# Patient Record
Sex: Female | Born: 1990
Health system: Southern US, Community
[De-identification: ages and names within clinical notes are randomized; demographics above are authoritative.]

## PROBLEM LIST (undated history)

## (undated) DIAGNOSIS — D649 Anemia, unspecified: Secondary | ICD-10-CM

## (undated) DIAGNOSIS — Z720 Tobacco use: Secondary | ICD-10-CM

## (undated) DIAGNOSIS — U071 COVID-19: Secondary | ICD-10-CM

## (undated) DIAGNOSIS — E559 Vitamin D deficiency, unspecified: Secondary | ICD-10-CM

## (undated) DIAGNOSIS — K5900 Constipation, unspecified: Secondary | ICD-10-CM

## (undated) DIAGNOSIS — J45909 Unspecified asthma, uncomplicated: Secondary | ICD-10-CM

## (undated) HISTORY — DX: Unspecified asthma, uncomplicated: J45.909

## (undated) HISTORY — DX: Vitamin D deficiency, unspecified: E55.9

## (undated) HISTORY — DX: COVID-19: U07.1

## (undated) HISTORY — DX: Constipation, unspecified: K59.00

## (undated) HISTORY — DX: Anemia, unspecified: D64.9

---

## 2011-05-28 ENCOUNTER — Encounter: Payer: Self-pay | Admitting: Emergency Medicine

## 2011-05-28 ENCOUNTER — Emergency Department (HOSPITAL_COMMUNITY)
Admission: EM | Admit: 2011-05-28 | Discharge: 2011-05-28 | Disposition: A | Discharge status: Home / Self Care | Payer: Self-pay | Attending: Emergency Medicine | Admitting: Emergency Medicine

## 2011-05-28 DIAGNOSIS — F17200 Nicotine dependence, unspecified, uncomplicated: Secondary | ICD-10-CM | POA: Insufficient documentation

## 2011-05-28 DIAGNOSIS — K029 Dental caries, unspecified: Secondary | ICD-10-CM | POA: Insufficient documentation

## 2011-05-28 DIAGNOSIS — K089 Disorder of teeth and supporting structures, unspecified: Secondary | ICD-10-CM | POA: Insufficient documentation

## 2011-05-28 MED ORDER — OXYCODONE-ACETAMINOPHEN 5-325 MG PO TABS
ORAL_TABLET | ORAL | Status: AC
  Filled 2011-05-28: qty 1

## 2011-05-28 MED ORDER — PENICILLIN V POTASSIUM 500 MG PO TABS: 500.0000 mg | ORAL_TABLET | Freq: Four times a day (QID) | ORAL | Status: AC

## 2011-05-28 MED ORDER — OXYCODONE-ACETAMINOPHEN 5-325 MG PO TABS
1.0000 | ORAL_TABLET | Freq: Once | ORAL | Status: AC
  Administered 2011-05-28: 1 via ORAL
  Filled 2011-05-28: qty 1

## 2011-05-28 NOTE — ED Provider Notes (Signed)
History     CSN: [id number]  Arrival date & time 05/28/11  1712   First MD Initiated Contact with Patient 05/28/11 1748      Chief Complaint  Patient presents with  . Dental Pain     Patient is a 20 y.o. female presenting with tooth pain. The history is provided by the patient.  Dental PainThe primary symptoms include mouth pain. Primary symptoms do not include fever. Episode onset: several months ago. The symptoms are worsening. The symptoms are chronic. The symptoms occur constantly.  Additional symptoms do not include: trouble swallowing.    Past Medical History  Diagnosis Date  . Asthma     History reviewed. No pertinent past surgical history.  No family history on file.  History  Substance Use Topics  . Smoking status: Current Everyday Smoker  . Smokeless tobacco: Not on file  . Alcohol Use: Yes     occasional    OB History    Grav Para Term Preterm Abortions TAB SAB Ect Mult Living                  Review of Systems  Constitutional: Negative for fever.  HENT: Negative for trouble swallowing.     Allergies  Review of patient's allergies indicates no known allergies.  Home Medications   Current Outpatient Rx  Name Route Sig Dispense Refill  . PENICILLIN V POTASSIUM 500 MG PO TABS Oral Take 1 tablet (500 mg total) by mouth 4 (four) times daily. 40 tablet 0    BP 127/73  Pulse 82  Temp 99.1 F (37.3 C)  Resp 18  Ht 5\' 1"  (1.549 m)  Wt 106 lb (48.081 kg)  BMI 20.03 kg/m2  SpO2 100%  LMP 04/02/2011  Physical Exam  CONSTITUTIONAL: Well developed/well nourished HEAD AND FACE: Normocephalic/atraumatic EYES: EOMI ENMT: Mucous membranes moist, dental decay noted to tooth #10, no abscess noted No trismus noted NECK: supple no meningeal signs CV: S1/S2 noted, no murmurs/rubs/gallops noted LUNGS: Lungs are clear to auscultation bilaterally, no apparent distress ABDOMEN: soft, nontender, no rebound or guarding NEURO: Pt is awake/alert, moves all  extremitiesx4 EXTREMITIES:  full ROM SKIN: warm, color normal PSYCH: no abnormalities of mood noted   ED Course  Procedures     1. Dental caries       MDM  Nursing notes reviewed and considered in documentation         Joya Gaskins, MD 05/28/11 [phone]

## 2011-05-28 NOTE — ED Notes (Signed)
Pt c/o left upper toothache 

## 2011-05-28 NOTE — ED Notes (Signed)
Dr. Bebe Shaggy in to assess patient prior to my seeing patient.

## 2011-06-13 ENCOUNTER — Emergency Department (HOSPITAL_COMMUNITY)
Admission: EM | Admit: 2011-06-13 | Discharge: 2011-06-13 | Disposition: A | Discharge status: Home / Self Care | Payer: Self-pay | Attending: Emergency Medicine | Admitting: Emergency Medicine

## 2011-06-13 ENCOUNTER — Encounter (HOSPITAL_COMMUNITY): Payer: Self-pay

## 2011-06-13 DIAGNOSIS — F17200 Nicotine dependence, unspecified, uncomplicated: Secondary | ICD-10-CM | POA: Insufficient documentation

## 2011-06-13 DIAGNOSIS — J45909 Unspecified asthma, uncomplicated: Secondary | ICD-10-CM | POA: Insufficient documentation

## 2011-06-13 DIAGNOSIS — J45901 Unspecified asthma with (acute) exacerbation: Secondary | ICD-10-CM

## 2011-06-13 MED ORDER — IPRATROPIUM BROMIDE 0.02 % IN SOLN
0.5000 mg | Freq: Once | RESPIRATORY_TRACT | Status: AC
  Administered 2011-06-13: 0.5 mg via RESPIRATORY_TRACT
  Filled 2011-06-13: qty 2.5

## 2011-06-13 MED ORDER — ALBUTEROL SULFATE HFA 108 (90 BASE) MCG/ACT IN AERS: 1.0000 | INHALATION_SPRAY | Freq: Four times a day (QID) | RESPIRATORY_TRACT | Status: DC | PRN

## 2011-06-13 MED ORDER — ALBUTEROL SULFATE (5 MG/ML) 0.5% IN NEBU
5.0000 mg | INHALATION_SOLUTION | RESPIRATORY_TRACT | Status: AC
  Administered 2011-06-13: 5 mg via RESPIRATORY_TRACT
  Filled 2011-06-13 (×2): qty 0.5

## 2011-06-13 MED ORDER — PREDNISONE 50 MG PO TABS: ORAL_TABLET | ORAL | Status: AC

## 2011-06-13 MED ORDER — PREDNISONE 20 MG PO TABS
60.0000 mg | ORAL_TABLET | Freq: Once | ORAL | Status: AC
  Administered 2011-06-13: 60 mg via ORAL
  Filled 2011-06-13: qty 3

## 2011-06-13 NOTE — ED Notes (Signed)
Pt reports ate breakfast and started coughing after eating.  Pt reports became SOB.  States has history of asthma  And usually uses albuterol inhaler and nebulizer prn but is out of her medication.  C/O chest tightness.  Denies any cough or cold symptoms prior to this episode.

## 2011-06-13 NOTE — ED Provider Notes (Signed)
This chart was scribed for Joya Gaskins, MD by Williemae Natter. The patient was seen in room APA01/APA01 at 9:50 AM.  CSN: [id number]  Arrival date & time 06/13/11  4098   First MD Initiated Contact with Patient 06/13/11 0901      Chief Complaint  Patient presents with  . Shortness of Breath    HPI Madeline Davis is a 21 y.o. female who presents to the Emergency Department complaining of an asthma attack. She has no fever but has some associated wheezing. She ran out of her medications and is an everyday smoker. She woke up coughing/wheezing and mild chest tightness She denies recent h/o ICU admission She is new to area, no local PCP Similar to prior episodes of asthma   Past Medical History  Diagnosis Date  . Asthma     History reviewed. No pertinent past surgical history.  No family history on file.  History  Substance Use Topics  . Smoking status: Current Everyday Smoker  . Smokeless tobacco: Not on file  . Alcohol Use: No    OB History    Grav Para Term Preterm Abortions TAB SAB Ect Mult Living                  Review of Systems 10 Systems reviewed and are negative for acute change except as noted in the HPI.  Allergies  Review of patient's allergies indicates no known allergies.  Home Medications  No current outpatient prescriptions on file.  BP 112/75  Pulse 92  Temp(Src) 98.4 F (36.9 C) (Oral)  Resp 24  Ht 5\' 2"  (1.575 m)  Wt 107 lb (48.535 kg)  BMI 19.57 kg/m2  SpO2 99%  LMP 06/04/2011  Physical Exam CONSTITUTIONAL: Well developed/well nourished HEAD AND FACE: Normocephalic/atraumatic EYES: EOMI/PERRL ENMT: Mucous membranes moist NECK: supple no meningeal signs SPINE:entire spine nontender CV: S1/S2 noted, no murmurs/rubs/gallops noted LUNGS: Lungs are clear to auscultation bilaterally, no apparent distress ABDOMEN: soft, nontender, no rebound or guarding GU:no cva tenderness NEURO: Pt is awake/alert, moves all  extremitiesx4 EXTREMITIES: pulses normal, full ROM, no edema SKIN: warm, color normal PSYCH: no abnormalities of mood noted  ED Course  Procedures   Pt had already received nebs prior to my eval, feels improved, but nurse did note wheezing on arrival Stable for d/c  The patient appears reasonably screened and/or stabilized for discharge and I doubt any other medical condition or other [place] requiring further screening, evaluation, or treatment in the ED at this time prior to discharge.    MDM  Nursing notes reviewed and considered in documentation   I personally performed the services described in this documentation, which was scribed in my presence. The recorded information has been reviewed and considered.          Joya Gaskins, MD 06/13/11 1026

## 2011-08-10 ENCOUNTER — Encounter (HOSPITAL_COMMUNITY): Payer: Self-pay | Admitting: *Deleted

## 2011-08-10 ENCOUNTER — Emergency Department (HOSPITAL_COMMUNITY): Payer: Self-pay

## 2011-08-10 ENCOUNTER — Emergency Department (HOSPITAL_COMMUNITY)
Admission: EM | Admit: 2011-08-10 | Discharge: 2011-08-10 | Disposition: A | Discharge status: Home / Self Care | Payer: Self-pay | Attending: Emergency Medicine | Admitting: Emergency Medicine

## 2011-08-10 DIAGNOSIS — N760 Acute vaginitis: Secondary | ICD-10-CM | POA: Insufficient documentation

## 2011-08-10 DIAGNOSIS — A499 Bacterial infection, unspecified: Secondary | ICD-10-CM | POA: Insufficient documentation

## 2011-08-10 DIAGNOSIS — R1032 Left lower quadrant pain: Secondary | ICD-10-CM | POA: Insufficient documentation

## 2011-08-10 DIAGNOSIS — R109 Unspecified abdominal pain: Secondary | ICD-10-CM | POA: Insufficient documentation

## 2011-08-10 DIAGNOSIS — J45909 Unspecified asthma, uncomplicated: Secondary | ICD-10-CM | POA: Insufficient documentation

## 2011-08-10 DIAGNOSIS — K5900 Constipation, unspecified: Secondary | ICD-10-CM | POA: Insufficient documentation

## 2011-08-10 DIAGNOSIS — F17200 Nicotine dependence, unspecified, uncomplicated: Secondary | ICD-10-CM | POA: Insufficient documentation

## 2011-08-10 DIAGNOSIS — B9689 Other specified bacterial agents as the cause of diseases classified elsewhere: Secondary | ICD-10-CM | POA: Insufficient documentation

## 2011-08-10 LAB — URINALYSIS, ROUTINE W REFLEX MICROSCOPIC
Bilirubin Urine: NEGATIVE
Glucose, UA: NEGATIVE mg/dL
Hgb urine dipstick: NEGATIVE
Ketones, ur: NEGATIVE mg/dL
Leukocytes, UA: NEGATIVE
Nitrite: NEGATIVE
Protein, ur: NEGATIVE mg/dL
Specific Gravity, Urine: <1.005 — ABNORMAL LOW (ref 1.005–1.030)
Urobilinogen, UA: 0.2 mg/dL (ref 0.0–1.0)
pH: 6 (ref 5.0–8.0)

## 2011-08-10 LAB — POCT PREGNANCY, URINE: Preg Test, Ur: NEGATIVE

## 2011-08-10 LAB — CBC
HCT: 36.8 % (ref 36.0–46.0)
Hemoglobin: 12.4 g/dL (ref 12.0–15.0)
MCH: 28.4 pg (ref 26.0–34.0)
MCHC: 33.7 g/dL (ref 30.0–36.0)
MCV: 84.2 fL (ref 78.0–100.0)
Platelets: 328 10*3/uL (ref 150–400)
RBC: 4.37 MIL/uL (ref 3.87–5.11)
RDW: 12.3 % (ref 11.5–15.5)
WBC: 9.4 10*3/uL (ref 4.0–10.5)

## 2011-08-10 LAB — POCT I-STAT, CHEM 8
BUN: 4 mg/dL — ABNORMAL LOW (ref 6–23)
Calcium, Ion: 1.16 mmol/L (ref 1.12–1.32)
Chloride: 108 mEq/L (ref 96–112)
Creatinine, Ser: 0.7 mg/dL (ref 0.50–1.10)
Glucose, Bld: 84 mg/dL (ref 70–99)
HCT: 38 % (ref 36.0–46.0)
Hemoglobin: 12.9 g/dL (ref 12.0–15.0)
Potassium: 3.7 mEq/L (ref 3.5–5.1)
Sodium: 143 mEq/L (ref 135–145)
TCO2: 25 mmol/L (ref 0–100)

## 2011-08-10 LAB — WET PREP, GENITAL
Trich, Wet Prep: NONE SEEN
Yeast Wet Prep HPF POC: NONE SEEN

## 2011-08-10 MED ORDER — KETOROLAC TROMETHAMINE 30 MG/ML IJ SOLN
30.0000 mg | Freq: Once | INTRAMUSCULAR | Status: AC
  Administered 2011-08-10: 30 mg via INTRAVENOUS
  Filled 2011-08-10: qty 1

## 2011-08-10 MED ORDER — METRONIDAZOLE 500 MG PO TABS: 500.0000 mg | ORAL_TABLET | Freq: Two times a day (BID) | ORAL | Status: AC

## 2011-08-10 MED ORDER — NAPROXEN 500 MG PO TABS: 500.0000 mg | ORAL_TABLET | Freq: Two times a day (BID) | ORAL | Status: DC

## 2011-08-10 MED ORDER — HYDROCODONE-ACETAMINOPHEN 5-500 MG PO TABS: 1.0000 | ORAL_TABLET | Freq: Four times a day (QID) | ORAL | Status: AC | PRN

## 2011-08-10 NOTE — Discharge Instructions (Signed)
You have been diagnosed with undifferentiated abdominal pain.  Abdominal pain can be caused by many things. Your caregiver evaluates the seriousness of your pain by an examination and possibly blood or urine tests and imaging (CT scan, x-rays, ultrasound). Many cases can be observed and treated at home after initial evaluation in the emergency department. Even though you are being discharged home, abdominal pain can be unpredictable. Therefore, you need a repeat exam if your pain does not resolve, returns, or worsens. Most patient's with abdominal pain do not need to be admitted to the hospital or have surgery, but serious problems like appendicitis and gallbladder attacks can start out as nonspecific pain. Many abdominal conditions cannot be diagnosed in 1 visit, so followup evaluations are very important.  Seek immediate medical attention if:  *The pain does not go away or becomes severe. *Temperature above 101 develops *Repeated vomiting occurs(multiple episodes) *The pain becomes localized to portions of the abdomen. The right side could possibly be appendicitis. In an adult, the left lower portion of the abdomen could be colitis or diverticulitis. *Blood is being passed in stools or vomit *Return also if you develop chest pain, difficulty breathing, dizziness or fainting, or become confused poorly responsive or inconsolable (young children).     If you do not have a physician, you should reference the below phone numbers and call in the morning to establish follow up care.  RESOURCE GUIDE  Dental Problems  Patients with Medicaid: [facility]                     [facility] [address]                                           [address] Phone:  [phone]                                                  Phone:  [phone]  If unable to pay or uninsured, contact:  [facility] or [facility] to become qualified for the adult dental  clinic.  Chronic Pain Problems Contact [facility]  [phone] Patients need to be referred by their primary care doctor.  Insufficient Money for Medicine Contact [employer]:  call "211" or [facility] 271-5999.  No Primary Care Doctor Call [facility]  832-8000 Other agencies that provide inexpensive medical care    [facility]  [phone]    [facility]  [phone]    [facility]  [phone]    [facility]  [phone]    [facility]  [phone]    [facility]  [phone]  [facility]ological Services [facility]  [phone] [facility]  [phone] [facility]   [phone] (emergency services [phone])  Substance Abuse Resources [facility]  336-882-2125 [facility] 336-784-9470 [facility] 336-285-9073 [facility] 336-845-3988 Residential & Outpatient Substance Abuse Program  800-659-3381  Abuse/Neglect [place] Child Abuse Hotline (336) 641-3795 [place] Child Abuse Hotline 800-378-5315 (After Hours)  Emergency Shelter [facility] (336) 271-5985  [facility] (336) 275-9566 [facility]   Services (704) 372-4663  MRSA Hotline #:   832-7006    [place] Resources  [facility] [place]     [employer]                          [facility] [address] [place]                       [address]      [address]                                                  [place]                            [place] Phone:  [phone]                                   Phone:  [phone]                 Phone:  [phone]  [facility] Phone:  [phone]  [place] Child Abuse Hotline (336) 342-1394 (336) 342-3537 (After Hours)    

## 2011-08-10 NOTE — ED Provider Notes (Signed)
History     CSN: [id number]  Arrival date & time 08/10/11  0444   First MD Initiated Contact with Patient 08/10/11 0458      Chief Complaint  Patient presents with  . Flank Pain  . Abdominal Pain    LLQ    (Consider location/radiation/quality/duration/timing/severity/associated sxs/prior treatment) HPI Comments: 21 year old female with a history of no significant past medical problems other than asthma. She presents with acute onset of left lower quadrantand pain which is pressure-like and sharp and stabbing and radiating to the left flank. This is persistent, nothing makes this better, worse with palpation of the abdomen, not associated with fevers chills nausea vomiting dysuria diarrhea or rectal bleeding. She does have constipation, she endorses vaginal discharge which is chronic and creamy. The symptoms are persistent overnight, she has not had these symptoms in the past.  No past medical history of abdominal surgery  Patient is a 21 y.o. female presenting with flank pain and abdominal pain. The history is provided by the patient and a friend.  Flank Pain Associated symptoms include abdominal pain.  Abdominal Pain The primary symptoms of the illness include abdominal pain.    Past Medical History  Diagnosis Date  . Asthma     History reviewed. No pertinent past surgical history.  No family history on file.  History  Substance Use Topics  . Smoking status: Current Everyday Smoker -- 0.5 packs/day    Types: Cigarettes  . Smokeless tobacco: Not on file  . Alcohol Use: No    OB History    Grav Para Term Preterm Abortions TAB SAB Ect Mult Living                  Review of Systems  Gastrointestinal: Positive for abdominal pain.  Genitourinary: Positive for flank pain.  All other systems reviewed and are negative.    Allergies  Review of patient's allergies indicates no known allergies.  Home Medications   Current Outpatient Rx  Name Route Sig Dispense  Refill  . ALBUTEROL SULFATE HFA 108 (90 BASE) MCG/ACT IN AERS Inhalation Inhale 1-2 puffs into the lungs every 6 (six) hours as needed for wheezing. 1 Inhaler 1  . HYDROCODONE-ACETAMINOPHEN 5-500 MG PO TABS Oral Take 1-2 tablets by mouth every 6 (six) hours as needed for pain. 15 tablet 0  . METRONIDAZOLE 500 MG PO TABS Oral Take 1 tablet (500 mg total) by mouth 2 (two) times daily. 14 tablet 0  . NAPROXEN 500 MG PO TABS Oral Take 1 tablet (500 mg total) by mouth 2 (two) times daily with a meal. 30 tablet 0    BP 126/70  Pulse 71  Temp(Src) 98.7 F (37.1 C) (Oral)  Resp 20  Ht 5\' 1"  (1.549 m)  Wt 110 lb (49.896 kg)  BMI 20.78 kg/m2  SpO2 98%  LMP 08/01/2011  Physical Exam  Nursing note and vitals reviewed. Constitutional: She appears well-developed and well-nourished. No distress.  HENT:  Head: Normocephalic and atraumatic.  Mouth/Throat: Oropharynx is clear and moist. No oropharyngeal exudate.  Eyes: Conjunctivae and EOM are normal. Pupils are equal, round, and reactive to light. Right eye exhibits no discharge. Left eye exhibits no discharge. No scleral icterus.  Neck: Normal range of motion. Neck supple. No JVD present. No thyromegaly present.  Cardiovascular: Normal rate, regular rhythm, normal heart sounds and intact distal pulses.  Exam reveals no gallop and no friction rub.   No murmur heard. Pulmonary/Chest: Effort normal and breath sounds normal. No respiratory  distress. She has no wheezes. She has no rales.  Abdominal: Soft. Bowel sounds are normal. She exhibits no distension and no mass. There is tenderness ( guarding on the left lower cautery and, tenderness in the left lower quadrant, tenderness over the left flank.). There is guarding. There is no rebound.  Genitourinary:       Normal appearing vagina, minimal vaginal discharge, cervical os is closed, no cervical motion tenderness or adnexal tenderness or masses.  Chaperone present for entire vaginal exam   Musculoskeletal: Normal range of motion. She exhibits no edema and no tenderness.  Lymphadenopathy:    She has no cervical adenopathy.  Neurological: She is alert. Coordination normal.  Skin: Skin is warm and dry. No rash noted. No erythema.  Psychiatric: She has a normal mood and affect. Her behavior is normal.    ED Course  Procedures (including critical care time)  Labs Reviewed  URINALYSIS, ROUTINE W REFLEX MICROSCOPIC - Abnormal; Notable for the following:    Color, Urine STRAW (*)    Specific Gravity, Urine <1.005 (*)    All other components within normal limits  WET PREP, GENITAL - Abnormal; Notable for the following:    Clue Cells Wet Prep HPF POC MANY (*)    WBC, Wet Prep HPF POC FEW (*)    All other components within normal limits  POCT I-STAT, CHEM 8 - Abnormal; Notable for the following:    BUN 4 (*)    All other components within normal limits  CBC  POCT PREGNANCY, URINE  GC/CHLAMYDIA PROBE AMP, GENITAL   Ct Abdomen Pelvis Wo Contrast  08/10/2011  *RADIOLOGY REPORT*  Clinical Data: Left lower quadrant pain  CT ABDOMEN AND PELVIS WITHOUT CONTRAST  Technique:  Multidetector CT imaging of the abdomen and pelvis was performed following the standard protocol without intravenous contrast.  Comparison: None.  Findings: Limited images through the lung bases demonstrate no significant appreciable abnormality. The heart size is within normal limits. No pleural or pericardial effusion.  Intra-abdominal organ evaluation is limited without intravenous contrast.  Within this limitation, unremarkable liver, biliary system, spleen, pancreas, adrenal glands.  Symmetric renal size.  No hydronephrosis or hydroureter.  No calcifications along the expected ureteral course.  No bowel obstruction.  No CT evidence for colitis.  Limited evaluation of the appendix without intravenous or enteric contrast, does appear to fill with air.  Thin-walled bladder.  Indistinct appearance to the adnexa;  difficult to separate ovaries from adjacent to decompressed bowel loops.  Small amount of free fluid dependently within the pelvis.  Normal caliber vasculature.  No acute osseous abnormality.  IMPRESSION: No hydronephrosis or urinary tract calculi identified.  Small amount of free fluid within the pelvis is often physiologic. The adnexa are incompletely evaluated on this examination and if there is concern for pelvic pathology, consider ultrasound.  Original Report Authenticated By: Waneta Martins, M.D.     1. Abdominal pain   2. Bacterial vaginosis       MDM  Vital signs are unremarkable, patient has focal left lower quadrant symptoms without urinary symptoms. Sandi C. Chlamydia swab, urinalysis, rule out UTI versus diverticulitis versus ureterolithiasis  Patient reexamined, sleeping, abdominal pain improved on exam with minimal tenderness in the left lower quadrant. Again the patient had no adnexal tenderness or fullness, unlikely to be ovarian torsion, CT scan results reveal no signs of kidney stones or any other significant abnormalities and blood work shows normal white blood cell count, electrolytes, urinalysis. The wet prep  does confirm bacterial vaginosis for which the patient will be treated.  I've explained the results to the patient and have encouraged her to return within 12 hours should her symptoms worsen or continue.  Discharge Prescriptions include:  #1 Naprosyn #2 hydrocodone #3 Flagyl      Vida Roller, MD 08/10/11 [phone]

## 2011-08-10 NOTE — ED Notes (Signed)
C/o LLQ pressure sharp, stabbing pain to left flank onset 2200 yesterday.

## 2011-08-11 LAB — GC/CHLAMYDIA PROBE AMP, GENITAL
Chlamydia, DNA Probe: NEGATIVE
GC Probe Amp, Genital: NEGATIVE

## 2011-12-06 ENCOUNTER — Encounter (HOSPITAL_COMMUNITY): Payer: Self-pay | Admitting: Emergency Medicine

## 2011-12-06 ENCOUNTER — Emergency Department (HOSPITAL_COMMUNITY): Payer: No Typology Code available for payment source

## 2011-12-06 ENCOUNTER — Emergency Department (HOSPITAL_COMMUNITY)
Admission: EM | Admit: 2011-12-06 | Discharge: 2011-12-06 | Discharge status: Against Medical Advice | Payer: No Typology Code available for payment source | Attending: Emergency Medicine | Admitting: Emergency Medicine

## 2011-12-06 DIAGNOSIS — T07XXXA Unspecified multiple injuries, initial encounter: Secondary | ICD-10-CM | POA: Insufficient documentation
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
## 2012-03-06 ENCOUNTER — Emergency Department (HOSPITAL_COMMUNITY)
Admission: EM | Admit: 2012-03-06 | Discharge: 2012-03-06 | Disposition: A | Discharge status: Home / Self Care | Payer: No Typology Code available for payment source | Attending: Emergency Medicine | Admitting: Emergency Medicine

## 2012-03-06 ENCOUNTER — Encounter (HOSPITAL_COMMUNITY): Payer: Self-pay | Admitting: *Deleted

## 2012-03-06 DIAGNOSIS — S239XXA Sprain of unspecified parts of thorax, initial encounter: Secondary | ICD-10-CM | POA: Insufficient documentation

## 2012-03-06 DIAGNOSIS — F17200 Nicotine dependence, unspecified, uncomplicated: Secondary | ICD-10-CM | POA: Insufficient documentation

## 2012-03-06 DIAGNOSIS — T148XXA Other injury of unspecified body region, initial encounter: Secondary | ICD-10-CM

## 2012-03-06 DIAGNOSIS — S335XXA Sprain of ligaments of lumbar spine, initial encounter: Secondary | ICD-10-CM | POA: Insufficient documentation

## 2012-03-06 DIAGNOSIS — Y92410 Unspecified street and highway as the place of occurrence of the external cause: Secondary | ICD-10-CM | POA: Insufficient documentation

## 2012-03-06 DIAGNOSIS — J45909 Unspecified asthma, uncomplicated: Secondary | ICD-10-CM | POA: Insufficient documentation

## 2012-03-06 LAB — POCT PREGNANCY, URINE: Preg Test, Ur: NEGATIVE

## 2012-03-06 MED ORDER — IBUPROFEN 600 MG PO TABS: 600.0000 mg | ORAL_TABLET | Freq: Four times a day (QID) | ORAL | Status: DC | PRN

## 2012-03-06 MED ORDER — HYDROCODONE-ACETAMINOPHEN 5-325 MG PO TABS: 1.0000 | ORAL_TABLET | ORAL | Status: AC | PRN

## 2012-03-06 MED ORDER — CYCLOBENZAPRINE HCL 5 MG PO TABS: 5.0000 mg | ORAL_TABLET | Freq: Three times a day (TID) | ORAL | Status: DC | PRN
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
## 2012-03-08 NOTE — ED Provider Notes (Signed)
Medical screening examination/treatment/procedure(s) were performed by non-physician practitioner and as supervising physician I was immediately available for consultation/collaboration.   Shelda Jakes, MD 03/08/12 [phone]

## 2013-03-15 ENCOUNTER — Other Ambulatory Visit (HOSPITAL_COMMUNITY): Payer: Self-pay | Admitting: Nurse Practitioner

## 2013-03-15 DIAGNOSIS — N63 Unspecified lump in unspecified breast: Secondary | ICD-10-CM

## 2013-03-21 ENCOUNTER — Ambulatory Visit (HOSPITAL_COMMUNITY): Payer: Self-pay | Attending: Nurse Practitioner

## 2013-03-26 ENCOUNTER — Emergency Department (HOSPITAL_COMMUNITY)
Admission: EM | Admit: 2013-03-26 | Discharge: 2013-03-26 | Disposition: A | Discharge status: Home / Self Care | Payer: Self-pay | Attending: Emergency Medicine | Admitting: Emergency Medicine

## 2013-03-26 ENCOUNTER — Encounter (HOSPITAL_COMMUNITY): Payer: Self-pay | Admitting: Emergency Medicine

## 2013-03-26 DIAGNOSIS — Z79899 Other long term (current) drug therapy: Secondary | ICD-10-CM | POA: Insufficient documentation

## 2013-03-26 DIAGNOSIS — L723 Sebaceous cyst: Secondary | ICD-10-CM | POA: Insufficient documentation

## 2013-03-26 DIAGNOSIS — F17200 Nicotine dependence, unspecified, uncomplicated: Secondary | ICD-10-CM | POA: Insufficient documentation

## 2013-03-26 DIAGNOSIS — J45909 Unspecified asthma, uncomplicated: Secondary | ICD-10-CM | POA: Insufficient documentation

## 2013-03-26 MED ORDER — SULFAMETHOXAZOLE-TRIMETHOPRIM 800-160 MG PO TABS: 1.0000 | ORAL_TABLET | Freq: Two times a day (BID) | ORAL | Status: DC

## 2013-03-26 NOTE — ED Notes (Signed)
?   Abscess to groin area for 2 mos.

## 2013-03-26 NOTE — ED Notes (Signed)
Pt presents with abscess-like area to pelvic area. Pt states she first noticed area 2-3 months ago and had been seen previously for symptoms. Pt states symptoms have worsened and reports pain and drainage from area.

## 2013-03-29 NOTE — ED Provider Notes (Signed)
CSN: [id number]     Arrival date & time 03/26/13  1252 History   First MD Initiated Contact with Patient 03/26/13 1330     Chief Complaint  Patient presents with  . Abscess   (Consider location/radiation/quality/duration/timing/severity/associated sxs/prior Treatment) HPI Comments: Madeline Davis is a 22 y.o. Female presenting with a chronic abscess of her right groin.  She describes a small pore along her right groin which drains a small amount of purulent drianage when squeezed.  This started as a small round papule about 2 months ago and become a long, thin hard line of tender skin that is painful with palpation and when her panty edge rubs against it.  She denies fevers or chills, nausea or other complaint.  She has found no alleviators and was been using warm compresses without relief.     The history is provided by the patient.    Past Medical History  Diagnosis Date  . Asthma    History reviewed. No pertinent past surgical history. History reviewed. No pertinent family history. History  Substance Use Topics  . Smoking status: Current Every Day Smoker -- 0.50 packs/day for 6 years    Types: Cigarettes  . Smokeless tobacco: Never Used  . Alcohol Use: Yes     Comment: occasionally   OB History   Grav Para Term Preterm Abortions TAB SAB Ect Mult Living   1    1  1    0     Review of Systems  Constitutional: Negative for fever and chills.  HENT: Negative for facial swelling.   Respiratory: Negative for shortness of breath and wheezing.   Skin:       negative except as mentioned in hpi.  Neurological: Negative for numbness.    Allergies  Review of patient's allergies indicates no known allergies.  Home Medications   Current Outpatient Rx  Name  Route  Sig  Dispense  Refill  . albuterol (PROVENTIL HFA;VENTOLIN HFA) 108 (90 BASE) MCG/ACT inhaler   Inhalation   Inhale 2 puffs into the lungs every 6 (six) hours as needed for wheezing.         .  sulfamethoxazole-trimethoprim (SEPTRA DS) 800-160 MG per tablet   Oral   Take 1 tablet by mouth 2 (two) times daily.   28 tablet   0    BP 104/79  Pulse 87  Temp(Src) 98.9 F (37.2 C) (Oral)  Resp 20  Ht 5' (1.524 m)  Wt 99 lb (44.906 kg)  BMI 19.33 kg/m2  SpO2 99%  LMP 02/24/2013 Physical Exam  Constitutional: She appears well-developed and well-nourished. No distress.  HENT:  Head: Normocephalic.  Neck: Neck supple.  Cardiovascular: Normal rate.   Pulmonary/Chest: Effort normal. She has no wheezes.  Musculoskeletal: Normal range of motion. She exhibits no edema.  Skin: Skin is warm. No erythema.  Linear, slightly raised induration without fluctuance or erthema,  Right inguinal crease and groin measuring 0.5 cm x 6 cm.  Open pore at upper end which does drain a small amount of clear to slightly opaque fluid with ballotment.  No surrounding erythema.  No inguinal adenopathy.    ED Course  Procedures (including critical care time) Labs Review Labs Reviewed - No data to display Imaging Review No results found.  EKG Interpretation   None       MDM   1. Sebaceous cyst    Suspect chronic sebaceous cyst with chronic infection.  Placed on bactrim,  Encouraged to continue with warm soaks.  Referral to general surgery for definitive excision.    Burgess Amor, PA-C 03/29/13 1344

## 2013-03-30 NOTE — ED Provider Notes (Signed)
Medical screening examination/treatment/procedure(s) were performed by non-physician practitioner and as supervising physician I was immediately available for consultation/collaboration.  EKG Interpretation   None         Willard Farquharson L Annalisa Colonna, MD 03/30/13 1347 

## 2013-04-11 ENCOUNTER — Other Ambulatory Visit (HOSPITAL_COMMUNITY): Payer: Self-pay | Admitting: Nurse Practitioner

## 2013-04-11 ENCOUNTER — Ambulatory Visit (HOSPITAL_COMMUNITY)
Admission: RE | Admit: 2013-04-11 | Discharge: 2013-04-11 | Disposition: A | Discharge status: Home / Self Care | Payer: Self-pay | Source: Ambulatory Visit | Attending: Nurse Practitioner | Admitting: Nurse Practitioner

## 2013-04-11 DIAGNOSIS — N63 Unspecified lump in unspecified breast: Secondary | ICD-10-CM | POA: Insufficient documentation

## 2013-05-07 ENCOUNTER — Emergency Department (HOSPITAL_COMMUNITY): Payer: Self-pay

## 2013-05-07 ENCOUNTER — Emergency Department (HOSPITAL_COMMUNITY)
Admission: EM | Admit: 2013-05-07 | Discharge: 2013-05-07 | Disposition: A | Discharge status: Home / Self Care | Payer: Self-pay | Attending: Emergency Medicine | Admitting: Emergency Medicine

## 2013-05-07 ENCOUNTER — Encounter (HOSPITAL_COMMUNITY): Payer: Self-pay | Admitting: Emergency Medicine

## 2013-05-07 DIAGNOSIS — F17200 Nicotine dependence, unspecified, uncomplicated: Secondary | ICD-10-CM | POA: Insufficient documentation

## 2013-05-07 DIAGNOSIS — S82899A Other fracture of unspecified lower leg, initial encounter for closed fracture: Secondary | ICD-10-CM | POA: Insufficient documentation

## 2013-05-07 DIAGNOSIS — Z79899 Other long term (current) drug therapy: Secondary | ICD-10-CM | POA: Insufficient documentation

## 2013-05-07 DIAGNOSIS — Y939 Activity, unspecified: Secondary | ICD-10-CM | POA: Insufficient documentation

## 2013-05-07 DIAGNOSIS — J45901 Unspecified asthma with (acute) exacerbation: Secondary | ICD-10-CM | POA: Insufficient documentation

## 2013-05-07 DIAGNOSIS — Y929 Unspecified place or not applicable: Secondary | ICD-10-CM | POA: Insufficient documentation

## 2013-05-07 DIAGNOSIS — S82891A Other fracture of right lower leg, initial encounter for closed fracture: Secondary | ICD-10-CM

## 2013-05-07 DIAGNOSIS — R296 Repeated falls: Secondary | ICD-10-CM | POA: Insufficient documentation

## 2013-05-07 MED ORDER — IBUPROFEN 800 MG PO TABS: 800.0000 mg | ORAL_TABLET | Freq: Three times a day (TID) | ORAL | Status: DC

## 2013-05-07 MED ORDER — OXYCODONE-ACETAMINOPHEN 5-325 MG PO TABS: 1.0000 | ORAL_TABLET | Freq: Four times a day (QID) | ORAL | Status: DC | PRN

## 2013-05-07 NOTE — ED Notes (Signed)
Pain rt ankle,, swelling lat malleolus. No known injury.   Good dp pulse.  Ice pack applied.

## 2013-05-07 NOTE — ED Notes (Signed)
Patient c/o right ankle pain that started on Saturday. Per patient no known injury. Patient reports falling this morning because she was unable to put pressure on ankle. Patient reports swelling. Per patient took tylenol yesterday with no relief.

## 2013-05-07 NOTE — ED Provider Notes (Signed)
CSN: [id number]     Arrival date & time 05/07/13  1241 History   First MD Initiated Contact with Patient 05/07/13 1457     Chief Complaint  Patient presents with  . Ankle Pain   (Consider location/radiation/quality/duration/timing/severity/associated sxs/prior Treatment) Patient is a 22 y.o. female presenting with ankle pain. The history is provided by the patient.  Ankle Pain Location:  Ankle Time since incident:  2 days Ankle location:  R ankle Pain details:    Quality:  Sharp and throbbing   Severity:  Moderate   Onset quality:  Gradual   Duration:  2 days   Timing:  Constant   Progression:  Worsening Chronicity:  New Dislocation: no   Relieved by:  Nothing Ineffective treatments:  Acetaminophen Associated symptoms: no back pain and no neck pain   Risk factors: no known bone disorder, no obesity and no recent illness     Past Medical History  Diagnosis Date  . Asthma    History reviewed. No pertinent past surgical history. History reviewed. No pertinent family history. History  Substance Use Topics  . Smoking status: Current Every Day Smoker -- 0.50 packs/day for 6 years    Types: Cigarettes  . Smokeless tobacco: Never Used  . Alcohol Use: Yes     Comment: occasionally   OB History   Grav Para Term Preterm Abortions TAB SAB Ect Mult Living   1    1  1    0     Review of Systems  Constitutional: Negative for activity change.       All ROS Neg except as noted in HPI  HENT: Negative for nosebleeds.   Eyes: Negative for photophobia and discharge.  Respiratory: Positive for wheezing. Negative for cough and shortness of breath.   Cardiovascular: Negative for chest pain and palpitations.  Gastrointestinal: Negative for abdominal pain and blood in stool.  Genitourinary: Negative for dysuria, frequency and hematuria.  Musculoskeletal: Negative for arthralgias, back pain and neck pain.  Skin: Negative.   Neurological: Negative for dizziness, seizures and speech  difficulty.  Psychiatric/Behavioral: Negative for hallucinations and confusion.    Allergies  Review of patient's allergies indicates not on file.  Home Medications   Current Outpatient Rx  Name  Route  Sig  Dispense  Refill  . acetaminophen (TYLENOL) 500 MG tablet   Oral   Take 1,000 mg by mouth every 6 (six) hours as needed.         Marland Kitchen albuterol (PROVENTIL HFA;VENTOLIN HFA) 108 (90 BASE) MCG/ACT inhaler   Inhalation   Inhale 2 puffs into the lungs every 6 (six) hours as needed for wheezing.          BP 104/61  Pulse 93  Temp(Src) 97.2 F (36.2 C) (Oral)  Resp 18  Ht 5' (1.524 m)  Wt 98 lb (44.453 kg)  BMI 19.14 kg/m2  SpO2 100% Physical Exam  Nursing note and vitals reviewed. Constitutional: She is oriented to person, place, and time. She appears well-developed and well-nourished.  Non-toxic appearance.  HENT:  Head: Normocephalic.  Right Ear: Tympanic membrane and external ear normal.  Left Ear: Tympanic membrane and external ear normal.  Eyes: EOM and lids are normal. Pupils are equal, round, and reactive to light.  Neck: Normal range of motion. Neck supple. Carotid bruit is not present.  Cardiovascular: Normal rate, regular rhythm, normal heart sounds, intact distal pulses and normal pulses.   Pulmonary/Chest: Breath sounds normal. No respiratory distress.  Abdominal: Soft. Bowel sounds are  normal. There is no tenderness. There is no guarding.  Musculoskeletal: Normal range of motion.  Rt lateral malleolus swelling and pain to palpation. Mild increase redness present.Marland Kitchen FROM of the toes. Achilles intact.  Lymphadenopathy:       Head (right side): No submandibular adenopathy present.       Head (left side): No submandibular adenopathy present.    She has no cervical adenopathy.  Neurological: She is alert and oriented to person, place, and time. She has normal strength. No cranial nerve deficit or sensory deficit.  Skin: Skin is warm and dry.  Psychiatric: She  has a normal mood and affect. Her speech is normal.    ED Course  Procedures (including critical care time) Labs Review Labs Reviewed - No data to display Imaging Review Dg Ankle Complete Right  05/07/2013   CLINICAL DATA:  Right ankle pain without injury.  EXAM: RIGHT ANKLE - COMPLETE 3+ VIEW  COMPARISON:  None.  FINDINGS: Small crescent shaped bone fragment is seen distal to the distal fibula concerning for avulsion fracture. Soft tissue swelling is seen over the lateral malleolus. Joint spaces appear to be intact. Talar dome appears normal.  IMPRESSION: Probable avulsion fracture involving distal fibula of indeterminate age.   Electronically Signed   By: Roque Lias M.D.   On: 05/07/2013 13:26    EKG Interpretation   None       MDM  No diagnosis found. **I have reviewed nursing notes, vital signs, and all appropriate lab and imaging results for this patient.*  Xray of the right ankle reveals probable avulsion fracture of the disstal fibula. Pt fitted with ASO and given crutches. Rx for ibuprofen and oxycodone given to the patient.  Kathie Dike, PA-C 05/07/13 2146

## 2013-05-16 NOTE — ED Provider Notes (Signed)
Medical screening examination/treatment/procedure(s) were performed by non-physician practitioner and as supervising physician I was immediately available for consultation/collaboration.  EKG Interpretation   None         Shelda Jakes, MD 05/16/13 [phone]

## 2014-01-07 ENCOUNTER — Encounter: Payer: Self-pay | Admitting: Obstetrics & Gynecology

## 2014-04-01 ENCOUNTER — Encounter (HOSPITAL_COMMUNITY): Payer: Self-pay | Admitting: Emergency Medicine

## 2014-08-12 ENCOUNTER — Ambulatory Visit (INDEPENDENT_AMBULATORY_CARE_PROVIDER_SITE_OTHER): Payer: 59 | Admitting: Adult Health

## 2014-08-12 ENCOUNTER — Other Ambulatory Visit (HOSPITAL_COMMUNITY)
Admission: RE | Admit: 2014-08-12 | Discharge: 2014-08-12 | Disposition: A | Discharge status: Home / Self Care | Payer: 59 | Source: Ambulatory Visit | Attending: Adult Health | Admitting: Adult Health

## 2014-08-12 ENCOUNTER — Encounter: Payer: Self-pay | Admitting: Adult Health

## 2014-08-12 VITALS — BP 108/64 | HR 74 | Ht 60.5 in | Wt 107.0 lb

## 2014-08-12 DIAGNOSIS — Z113 Encounter for screening for infections with a predominantly sexual mode of transmission: Secondary | ICD-10-CM | POA: Diagnosis present

## 2014-08-12 DIAGNOSIS — Z3202 Encounter for pregnancy test, result negative: Secondary | ICD-10-CM | POA: Diagnosis not present

## 2014-08-12 DIAGNOSIS — K5900 Constipation, unspecified: Secondary | ICD-10-CM

## 2014-08-12 DIAGNOSIS — Z01419 Encounter for gynecological examination (general) (routine) without abnormal findings: Secondary | ICD-10-CM | POA: Insufficient documentation

## 2014-08-12 HISTORY — DX: Constipation, unspecified: K59.00

## 2014-08-12 LAB — POCT URINE PREGNANCY: Preg Test, Ur: NEGATIVE

## 2014-08-12 NOTE — Patient Instructions (Signed)
Physical in 1 year Will call with labs Try MIRALAX   Increase water and fruits  Constipation Constipation is when a person has fewer than three bowel movements a week, has difficulty having a bowel movement, or has stools that are dry, hard, or larger than normal. As people grow older, constipation is more common. If you try to fix constipation with medicines that make you have a bowel movement (laxatives), the problem may get worse. Long-term laxative use may cause the muscles of the colon to become weak. A low-fiber diet, not taking in enough fluids, and taking certain medicines may make constipation worse.  CAUSES   Certain medicines, such as antidepressants, pain medicine, iron supplements, antacids, and water pills.   Certain diseases, such as diabetes, irritable bowel syndrome (IBS), thyroid disease, or depression.   Not drinking enough water.   Not eating enough fiber-rich foods.   Stress or travel.   Lack of physical activity or exercise.   Ignoring the urge to have a bowel movement.   Using laxatives too much.  SIGNS AND SYMPTOMS   Having fewer than three bowel movements a week.   Straining to have a bowel movement.   Having stools that are hard, dry, or larger than normal.   Feeling full or bloated.   Pain in the lower abdomen.   Not feeling relief after having a bowel movement.  DIAGNOSIS  Your health care provider will take a medical history and perform a physical exam. Further testing may be done for severe constipation. Some tests may include:  A barium enema X-ray to examine your rectum, colon, and, sometimes, your small intestine.   A sigmoidoscopy to examine your lower colon.   A colonoscopy to examine your entire colon. TREATMENT  Treatment will depend on the severity of your constipation and what is causing it. Some dietary treatments include drinking more fluids and eating more fiber-rich foods. Lifestyle treatments may include regular  exercise. If these diet and lifestyle recommendations do not help, your health care provider may recommend taking over-the-counter laxative medicines to help you have bowel movements. Prescription medicines may be prescribed if over-the-counter medicines do not work.  HOME CARE INSTRUCTIONS   Eat foods that have a lot of fiber, such as fruits, vegetables, whole grains, and beans.  Limit foods high in fat and processed sugars, such as french fries, hamburgers, cookies, candies, and soda.   A fiber supplement may be added to your diet if you cannot get enough fiber from foods.   Drink enough fluids to keep your urine clear or pale yellow.   Exercise regularly or as directed by your health care provider.   Go to the restroom when you have the urge to go. Do not hold it.   Only take over-the-counter or prescription medicines as directed by your health care provider. Do not take other medicines for constipation without talking to your health care provider first.  SEEK IMMEDIATE MEDICAL CARE IF:   You have bright red blood in your stool.   Your constipation lasts for more than 4 days or gets worse.   You have abdominal or rectal pain.   You have thin, pencil-like stools.   You have unexplained weight loss. MAKE SURE YOU:   Understand these instructions.  Will watch your condition.  Will get help right away if you are not doing well or get worse. Document Released: 02/13/2004 Document Revised: 05/22/2013 Document Reviewed: 02/26/2013 [facility] Patient Information 2015 [employer] [place]. This information is not intended  to replace advice given to you by your health care provider. Make sure you discuss any questions you have with your health care provider.  

## 2014-08-12 NOTE — Progress Notes (Signed)
Patient ID: Madeline Davis, female   DOB: 01-21-1991, 24 y.o.   MRN: [id number] History of Present Illness: Madeline Davis is a 24 year old black female in for well woman gyn exam and pap and requests labs and STD screening.Her periods irregular since stopping depo.She is in female relationship at present.She complains of some constipation and breast tenderness.   Current Medications, Allergies, Past Medical History, Past Surgical History, Family History and Social History [facility].     Review of Systems: Patient denies any headaches, hearing loss, fatigue, blurred vision, shortness of breath, chest pain, abdominal pain, problems with  urination, or intercourse. No joint pain or mood swings.See HPI for positives.   Physical Exam:BP 108/64 mmHg  Pulse 74  Ht 5' 0.5" (1.537 m)  Wt 107 lb (48.535 kg)  BMI 20.55 kg/m2  LMP 08/08/2014 General:  Well developed, well nourished, no acute distress Skin:  Warm and dry Neck:  Midline trachea, normal thyroid, good ROM, no lymphadenopathy Lungs; Clear to auscultation bilaterally Breast:  No dominant palpable mass, retraction, or nipple discharge, tender UOQ bilaterally Cardiovascular: Regular rate and rhythm Abdomen:  Soft, non tender, no hepatosplenomegaly Pelvic:  External genitalia is normal in appearance, no lesions.  The vagina is normal in appearance, good color, moisture and rugae. Urethra has no lesions or masses. The cervix is tiny, pap with GC/CHL performed.  Uterus is felt to be normal size, shape, and contour.  No adnexal masses or tenderness noted.Bladder is non tender, no masses felt. Extremities/musculoskeletal:  No swelling or varicosities noted, no clubbing or cyanosis Psych:  No mood changes, alert and cooperative,seems happy   Impression: Well woman gyn exam with pap  Constipation STD screening    Plan: Check CBC,CMP,TSH and lipids and HSV2 and HIV and RPR and Vitamin D GC/CHL on pap Try  miralax and prune juice and increase water,review handout on constipation Decrease caffeine and do not wear under wire daily

## 2014-08-13 ENCOUNTER — Telehealth: Payer: Self-pay | Admitting: *Deleted

## 2014-08-13 ENCOUNTER — Telehealth: Payer: Self-pay | Admitting: Adult Health

## 2014-08-13 LAB — COMPREHENSIVE METABOLIC PANEL
ALT: 15 IU/L (ref 0–32)
AST: 19 IU/L (ref 0–40)
Albumin/Globulin Ratio: 1.5 (ref 1.1–2.5)
Albumin: 4.5 g/dL (ref 3.5–5.5)
Alkaline Phosphatase: 84 IU/L (ref 39–117)
BUN/Creatinine Ratio: 13 (ref 8–20)
BUN: 10 mg/dL (ref 6–20)
Bilirubin Total: 0.3 mg/dL (ref 0.0–1.2)
CO2: 23 mmol/L (ref 18–29)
Calcium: 9.3 mg/dL (ref 8.7–10.2)
Chloride: 101 mmol/L (ref 97–108)
Creatinine, Ser: 0.79 mg/dL (ref 0.57–1.00)
GFR calc Af Amer: 121 mL/min/{1.73_m2} (ref >59)
GFR calc non Af Amer: 105 mL/min/{1.73_m2} (ref >59)
Globulin, Total: 3 g/dL (ref 1.5–4.5)
Glucose: 84 mg/dL (ref 65–99)
Potassium: 4.1 mmol/L (ref 3.5–5.2)
Sodium: 139 mmol/L (ref 134–144)
Total Protein: 7.5 g/dL (ref 6.0–8.5)

## 2014-08-13 LAB — CBC
HCT: 40.9 % (ref 34.0–46.6)
Hemoglobin: 13.6 g/dL (ref 11.1–15.9)
MCH: 28.7 pg (ref 26.6–33.0)
MCHC: 33.3 g/dL (ref 31.5–35.7)
MCV: 86 fL (ref 79–97)
Platelets: 337 10*3/uL (ref 150–379)
RBC: 4.74 x10E6/uL (ref 3.77–5.28)
RDW: 13.5 % (ref 12.3–15.4)
WBC: 7.1 10*3/uL (ref 3.4–10.8)

## 2014-08-13 LAB — LIPID PANEL
Chol/HDL Ratio: 3.5 ratio units (ref 0.0–4.4)
Cholesterol, Total: 166 mg/dL (ref 100–199)
HDL: 47 mg/dL (ref >39)
LDL Calculated: 105 mg/dL — ABNORMAL HIGH (ref 0–99)
Triglycerides: 72 mg/dL (ref 0–149)
VLDL Cholesterol Cal: 14 mg/dL (ref 5–40)

## 2014-08-13 LAB — TSH: TSH: 1.32 u[IU]/mL (ref 0.450–4.500)

## 2014-08-13 LAB — RPR: RPR Ser Ql: NONREACTIVE

## 2014-08-13 LAB — VITAMIN D 25 HYDROXY (VIT D DEFICIENCY, FRACTURES): Vit D, 25-Hydroxy: 8.5 ng/mL — ABNORMAL LOW (ref 30.0–100.0)

## 2014-08-13 LAB — HSV 2 ANTIBODY, IGG: HSV 2 Glycoprotein G Ab, IgG: <0.91 index (ref 0.00–0.90)

## 2014-08-13 LAB — HIV ANTIBODY (ROUTINE TESTING W REFLEX): HIV Screen 4th Generation wRfx: NONREACTIVE

## 2014-08-13 NOTE — Telephone Encounter (Signed)
Left message to call about labs 

## 2014-08-13 NOTE — Telephone Encounter (Signed)
Left message about labs on her voice mail and that she needs to take 5000 IU vitamin D3 daily

## 2014-08-14 LAB — CYTOLOGY - PAP

## 2014-10-07 ENCOUNTER — Ambulatory Visit (INDEPENDENT_AMBULATORY_CARE_PROVIDER_SITE_OTHER): Payer: 59 | Admitting: Internal Medicine

## 2014-10-07 VITALS — BP 130/80 | HR 92 | Temp 99.3°F | Resp 20 | Ht 60.5 in | Wt 105.0 lb

## 2014-10-07 DIAGNOSIS — R0981 Nasal congestion: Secondary | ICD-10-CM

## 2014-10-07 DIAGNOSIS — R05 Cough: Secondary | ICD-10-CM

## 2014-10-07 DIAGNOSIS — J4521 Mild intermittent asthma with (acute) exacerbation: Secondary | ICD-10-CM | POA: Diagnosis not present

## 2014-10-07 DIAGNOSIS — R5081 Fever presenting with conditions classified elsewhere: Secondary | ICD-10-CM | POA: Diagnosis not present

## 2014-10-07 DIAGNOSIS — J988 Other specified respiratory disorders: Secondary | ICD-10-CM

## 2014-10-07 DIAGNOSIS — J22 Unspecified acute lower respiratory infection: Secondary | ICD-10-CM

## 2014-10-07 MED ORDER — PREDNISONE 20 MG PO TABS
ORAL_TABLET | ORAL | Status: DC
Start: 2014-10-07 — End: 2014-10-10

## 2014-10-07 MED ORDER — ALBUTEROL SULFATE HFA 108 (90 BASE) MCG/ACT IN AERS: 2.0000 | INHALATION_SPRAY | RESPIRATORY_TRACT | Status: DC | PRN

## 2014-10-07 MED ORDER — AZITHROMYCIN 250 MG PO TABS: ORAL_TABLET | ORAL | Status: DC

## 2014-10-07 NOTE — Progress Notes (Signed)
Subjective:  This chart was scribed for Ellamae Siaobert Doolittle, MD by Stann Oresung-Kai Tsai, Medical Scribe. This patient was seen in Room 14 and the patient's care was started at 8:58 PM.     Patient ID: Madeline Davis, female    DOB: 11/19/1990, 24 y.o.   MRN: [id number]  HPI Madeline Davis is a 24 y.o. female who has seasonal allergies and asthma [facility] complaining of coughing and sore throat with associated symptoms including rhinorrhea, chest pains due to coughing, left ear pain, and congestion starting last week. She states it started with her allergies triggering her asthma. She reports of having chest pain with bad cough, and myalgias since 3 days ago. And she also mentions having a fever starting since 2 days ago. She notes vomiting 2 and 3 days ago though she thought this was related to hard she was coughing. She last used her albuterol inhaler around 6:30pm today. There is no shortness of breath at rest.  She has a past history of asthma but only needs medicine for 1 or 2 months out of the whole year Patient Active Problem List   Diagnosis Date Noted  . Constipation 08/12/2014   no ongoing problems otherwise   Review of Systems  Constitutional: Negative for fatigue and unexpected weight change.  HENT: Negative for ear discharge, mouth sores and trouble swallowing.   Eyes: Negative for photophobia, pain and redness.  Gastrointestinal: Negative for nausea.  Genitourinary: Negative for dysuria.  Skin: Negative for rash.       Objective:   Physical Exam  Constitutional: She is oriented to person, place, and time. She appears well-developed and well-nourished. No distress.  HENT:  Head: Normocephalic and atraumatic.  Right Ear: External ear normal.  Left Ear: External ear normal.  Mouth/Throat: Oropharynx is clear and moist. No oropharyngeal exudate.  Nares allergic looking TMs are clear  Eyes: Conjunctivae and EOM are normal. Pupils are equal, round, and reactive to light. Right  eye exhibits no discharge. Left eye exhibits no discharge.  Neck: Normal range of motion. Neck supple. No thyromegaly present.  Cardiovascular: Normal rate, regular rhythm and normal heart sounds.   No murmur heard. Pulmonary/Chest: Effort normal. She has wheezes. She has no rales. She exhibits no tenderness.  Wheezing throughout expiration bilaterally  Abdominal: There is no tenderness.  Musculoskeletal: She exhibits no edema.  Lymphadenopathy:    She has no cervical adenopathy.  Neurological: She is alert and oriented to person, place, and time. No cranial nerve deficit.  Skin: Skin is dry. No rash noted.  Psychiatric: She has a normal mood and affect. Her behavior is normal.  Nursing note and vitals reviewed.   Nebulizer treatment with albuterol produced minimal results Exam afterwards confirm continued expiratory wheezing      Assessment & Plan:  Asthma with acute exacerbation, mild intermittent  Lower respiratory infection ---? Mycoplasma  Meds ordered this encounter  Medications  . albuterol (PROVENTIL HFA;VENTOLIN HFA) 108 (90 BASE) MCG/ACT inhaler    Sig: Inhale 2 puffs into the lungs every 4 (four) hours as needed for wheezing.    Dispense:  1 Inhaler    Refill:  2  . azithromycin (ZITHROMAX) 250 MG tablet    Sig: As packaged    Dispense:  6 tablet    Refill:  0  . predniSONE (DELTASONE) 20 MG tablet    Sig: 3/3/3/2/2/1/1 single daily dose for 7 days    Dispense:  15 tablet    Refill:  0  out of work 48-72 hours Follow-up if wheezing not totally resolved in 2 weeks to consider prolonged steroid inhaler  I have completed the patient encounter in its entirety as documented by the scribe, with editing by me where necessary. Robert P. Merla Richesoolittle, M.D.

## 2014-10-09 ENCOUNTER — Emergency Department (HOSPITAL_COMMUNITY)
Admission: EM | Admit: 2014-10-09 | Discharge: 2014-10-10 | Disposition: A | Discharge status: Home / Self Care | Payer: 59 | Attending: Emergency Medicine | Admitting: Emergency Medicine

## 2014-10-09 ENCOUNTER — Encounter (HOSPITAL_COMMUNITY): Payer: Self-pay | Admitting: Emergency Medicine

## 2014-10-09 ENCOUNTER — Emergency Department (HOSPITAL_COMMUNITY): Payer: 59

## 2014-10-09 DIAGNOSIS — R05 Cough: Secondary | ICD-10-CM | POA: Diagnosis present

## 2014-10-09 DIAGNOSIS — J45901 Unspecified asthma with (acute) exacerbation: Secondary | ICD-10-CM | POA: Insufficient documentation

## 2014-10-09 DIAGNOSIS — Z79899 Other long term (current) drug therapy: Secondary | ICD-10-CM | POA: Insufficient documentation

## 2014-10-09 DIAGNOSIS — Z8719 Personal history of other diseases of the digestive system: Secondary | ICD-10-CM | POA: Insufficient documentation

## 2014-10-09 DIAGNOSIS — Z720 Tobacco use: Secondary | ICD-10-CM | POA: Insufficient documentation

## 2014-10-09 MED ORDER — ALBUTEROL SULFATE (2.5 MG/3ML) 0.083% IN NEBU
INHALATION_SOLUTION | RESPIRATORY_TRACT | Status: AC
  Administered 2014-10-09: 2.5 mg via RESPIRATORY_TRACT
  Filled 2014-10-09: qty 3

## 2014-10-09 MED ORDER — PREDNISONE 50 MG PO TABS
60.0000 mg | ORAL_TABLET | Freq: Once | ORAL | Status: AC
  Administered 2014-10-09: 60 mg via ORAL
  Filled 2014-10-09 (×2): qty 1

## 2014-10-09 MED ORDER — ALBUTEROL (5 MG/ML) CONTINUOUS INHALATION SOLN
10.0000 mg/h | INHALATION_SOLUTION | Freq: Once | RESPIRATORY_TRACT | Status: AC
  Administered 2014-10-09: 10 mg/h via RESPIRATORY_TRACT
  Filled 2014-10-09: qty 20

## 2014-10-09 MED ORDER — ALBUTEROL SULFATE (2.5 MG/3ML) 0.083% IN NEBU
2.5000 mg | INHALATION_SOLUTION | Freq: Once | RESPIRATORY_TRACT | Status: AC
  Administered 2014-10-09: 2.5 mg via RESPIRATORY_TRACT

## 2014-10-09 MED ORDER — IPRATROPIUM-ALBUTEROL 0.5-2.5 (3) MG/3ML IN SOLN
3.0000 mL | Freq: Once | RESPIRATORY_TRACT | Status: AC
  Administered 2014-10-09: 3 mL via RESPIRATORY_TRACT
  Filled 2014-10-09: qty 3

## 2014-10-09 NOTE — ED Provider Notes (Signed)
CSN: [id number]     Arrival date & time 10/09/14  1754 History   First MD Initiated Contact with Patient 10/09/14 2026     Chief Complaint  Patient presents with  . Cough     (Consider location/radiation/quality/duration/timing/severity/associated sxs/prior Treatment) Patient is a 24 y.o. female presenting with cough. The history is provided by the patient.  Cough Cough characteristics:  Productive Severity:  Moderate Onset quality:  Gradual Timing:  Intermittent Progression:  Worsening Chronicity:  New Context: upper respiratory infection   Relieved by:  Nothing Worsened by:  Activity and lying down Ineffective treatments:  Beta-agonist inhaler Associated symptoms: shortness of breath and wheezing    Madeline Davis is a 24 y.o. female who presents to the ED with a cough that started a week ago. She reports that her asthma was bothering her and she went to Urgent Care 3 days ago. She was started on antibiotics and prednisone but she continues to feel the same.   Past Medical History  Diagnosis Date  . Asthma   . Constipation 08/12/2014   History reviewed. No pertinent past surgical history. Family History  Problem Relation Age of Onset  . Hypertension Mother   . Hypertension Father   . Hyperlipidemia Father   . Stroke Father   . Alzheimer's disease Maternal Grandmother   . Alzheimer's disease Maternal Grandfather    History  Substance Use Topics  . Smoking status: Current Every Day Smoker -- 0.50 packs/day for 8 years    Types: Cigarettes  . Smokeless tobacco: Never Used  . Alcohol Use: Yes     Comment: occasionally   OB History    Gravida Para Term Preterm AB TAB SAB Ectopic Multiple Living   1    1  1    0     Review of Systems  HENT: Positive for congestion.   Respiratory: Positive for cough, chest tightness, shortness of breath and wheezing.   all other systems negative    Allergies  Review of patient's allergies indicates no known allergies.  Home  Medications   Prior to Admission medications   Medication Sig Start Date End Date Taking? Authorizing Provider  albuterol (PROVENTIL HFA;VENTOLIN HFA) 108 (90 BASE) MCG/ACT inhaler Inhale 2 puffs into the lungs every 4 (four) hours as needed for wheezing. 10/07/14  Yes Tonye Pearsonobert P Doolittle, MD  azithromycin (ZITHROMAX) 250 MG tablet As packaged Patient taking differently: Take 250-500 mg by mouth See admin instructions. TWO TABLETS ON DAY 1 AND ONE TABLET ON DAYS 2 THROUGH 5 STARTING ON 10/07/14 10/07/14  Yes Tonye Pearsonobert P Doolittle, MD  ibuprofen (ADVIL,MOTRIN) 200 MG tablet Take 200 mg by mouth every 6 (six) hours as needed for fever.   Yes Historical Provider, MD  predniSONE (DELTASONE) 10 MG tablet Take 6 tablets (60 mg total) by mouth daily. 10/10/14   Alveria Mcglaughlin Orlene OchM Chloris Marcoux, NP   BP 125/58 mmHg  Pulse 113  Temp(Src) 98.6 F (37 C) (Oral)  Resp 22  SpO2 100%  LMP 09/29/2014 Physical Exam  Constitutional: She is oriented to person, place, and time. She appears well-developed and well-nourished. No distress.  HENT:  Head: Normocephalic and atraumatic.  Eyes: Conjunctivae and EOM are normal.  Neck: Normal range of motion. Neck supple.  Cardiovascular: Normal rate and regular rhythm.   Pulmonary/Chest: Accessory muscle usage present. She has decreased breath sounds. She has wheezes. She has rhonchi.  Abdominal: Soft. There is no tenderness.  Musculoskeletal: Normal range of motion.  Lymphadenopathy:    She has  no cervical adenopathy.  Neurological: She is alert and oriented to person, place, and time. No cranial nerve deficit.  Skin: Skin is warm and dry.  Psychiatric: She has a normal mood and affect. Her behavior is normal.  Nursing note and vitals reviewed.   ED Course  Procedures (including critical care time) Albuterol/Atrovent Neb treatment with minimal improvement  Albuterol Neb treatment with very little improvement  Dr. Fayrene FearingJames in to examine the patient  Continuous neb over 1 hour with 10  mg albuterol Prednisone 60 mg PO Patient improved after treatment but continues to have wheezing. Ambulated and O2 Sat stayed above 97% on R/A. Patient able to speak in full sentences and does not appear in distress. Will change prednisone dose to 60 mg PO daily x 3 days and then drop to 20 mg PO daily. Discussed with the patient in detail if symptoms worsen she is to return. Patient and her mother voice understanding and agree with plan.   Labs Review Labs Reviewed - No data to display  Imaging Review Dg Chest 2 View  10/09/2014   CLINICAL DATA:  Productive cough.  Shortness of breath.  Chest pain.  EXAM: CHEST  2 VIEW  COMPARISON:  08/20/2014  FINDINGS: Airway thickening is present, suggesting bronchitis or reactive airways disease. Mild interstitial accentuation.  Cardiac and mediastinal margins appear normal. No pleural effusion. No airspace opacity identified.  IMPRESSION: *Airway thickening with mild interstitial accentuation potentially from atypical pneumonia, bronchitis, or reactive airways disease.   Electronically Signed   By: Gaylyn RongWalter  Liebkemann M.D.   On: 10/09/2014 22:08    MDM  24 y.o. female with cough and wheezing and shortness of breath. Improved with treatments and prednisone. Discussed with the patient and her mother clinical and x-ray findings and plan of care. All questioned fully answered. She will return if any problems arise. Stable for d/c with O2 SAT 100% on R/A on d/c. She will continue her Z-pak, albuterol inhaler and return as needed.   Final diagnoses:  Asthma attack       [facility] Stephen Baruch, NP 10/10/14 0142  Rolland PorterMark James, MD 10/14/14 [phone]

## 2014-10-09 NOTE — ED Notes (Signed)
Patient complaining of cough x 1 week. States "my asthma has been bothering me for 1 1/2 weeks. I went to Urgent Care on Monday and they gave me antibiotics and prednisone but I'm not feeling any better." NAD noted at triage.

## 2014-10-10 MED ORDER — PREDNISONE 10 MG PO TABS: 60.0000 mg | ORAL_TABLET | Freq: Every day | ORAL | Status: DC

## 2014-10-10 NOTE — Discharge Instructions (Signed)
Take the new Rx for the prednisone 60 mg daily for 3 days and the start back taking the the Rx you have taking one tablet daily.  Continue to take the Z-Pak and your Albuterol Inhaler as needed.   Return if your symptoms worsen.

## 2015-03-04 ENCOUNTER — Encounter: Payer: Self-pay | Admitting: Obstetrics & Gynecology

## 2015-03-04 ENCOUNTER — Ambulatory Visit (INDEPENDENT_AMBULATORY_CARE_PROVIDER_SITE_OTHER): Payer: 59 | Admitting: Obstetrics & Gynecology

## 2015-03-04 VITALS — BP 110/60 | HR 80 | Ht 61.2 in | Wt 108.0 lb

## 2015-03-04 DIAGNOSIS — Z3169 Encounter for other general counseling and advice on procreation: Secondary | ICD-10-CM | POA: Diagnosis not present

## 2015-04-02 NOTE — Progress Notes (Signed)
Patient ID: Madeline Davis, female   DOB: 1990/07/17, 24 y.o.   MRN: [id number] Chief Complaint  Patient presents with  . want to get pregnant    make sure every thing check out.    Blood pressure 110/60, pulse 80, height 5' 1.2" (1.554 m), weight 108 lb (48.988 kg), last menstrual period 02/13/2015.  Patient's last menstrual period was 02/13/2015.  Pt with regular menses and has regular intercourse  [id number]  Has a successful pregnancy  +FOB  No history of PID  No further testing needed at this point     Face to face time:  10 minutes  Greater than 50% of the visit time was spent in counseling and coordination of care with the patient.  The summary and outline of the counseling and care coordination is summarized in the note above.   All questions were answered.

## 2015-07-23 ENCOUNTER — Emergency Department (HOSPITAL_COMMUNITY): Payer: 59

## 2015-07-23 ENCOUNTER — Emergency Department (HOSPITAL_COMMUNITY)
Admission: EM | Admit: 2015-07-23 | Discharge: 2015-07-23 | Disposition: A | Discharge status: Home / Self Care | Payer: 59 | Attending: Emergency Medicine | Admitting: Emergency Medicine

## 2015-07-23 ENCOUNTER — Encounter (HOSPITAL_COMMUNITY): Payer: Self-pay | Admitting: Emergency Medicine

## 2015-07-23 DIAGNOSIS — F17210 Nicotine dependence, cigarettes, uncomplicated: Secondary | ICD-10-CM | POA: Insufficient documentation

## 2015-07-23 DIAGNOSIS — Y92481 Parking lot as the place of occurrence of the external cause: Secondary | ICD-10-CM | POA: Insufficient documentation

## 2015-07-23 DIAGNOSIS — F1010 Alcohol abuse, uncomplicated: Secondary | ICD-10-CM | POA: Insufficient documentation

## 2015-07-23 DIAGNOSIS — S161XXA Strain of muscle, fascia and tendon at neck level, initial encounter: Secondary | ICD-10-CM | POA: Insufficient documentation

## 2015-07-23 DIAGNOSIS — J45909 Unspecified asthma, uncomplicated: Secondary | ICD-10-CM | POA: Insufficient documentation

## 2015-07-23 DIAGNOSIS — Y9389 Activity, other specified: Secondary | ICD-10-CM | POA: Diagnosis not present

## 2015-07-23 DIAGNOSIS — Y998 Other external cause status: Secondary | ICD-10-CM | POA: Diagnosis not present

## 2015-07-23 DIAGNOSIS — M549 Dorsalgia, unspecified: Secondary | ICD-10-CM | POA: Diagnosis present

## 2015-07-23 MED ORDER — HYDROCODONE-ACETAMINOPHEN 5-325 MG PO TABS: 1.0000 | ORAL_TABLET | ORAL | Status: DC | PRN

## 2015-07-23 MED ORDER — IBUPROFEN 600 MG PO TABS: 600.0000 mg | ORAL_TABLET | Freq: Four times a day (QID) | ORAL | Status: DC | PRN

## 2015-07-23 MED ORDER — CYCLOBENZAPRINE HCL 5 MG PO TABS: 5.0000 mg | ORAL_TABLET | Freq: Three times a day (TID) | ORAL | Status: DC | PRN

## 2015-07-23 MED ORDER — IBUPROFEN 800 MG PO TABS: 800.0000 mg | ORAL_TABLET | Freq: Once | ORAL | Status: DC
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
## 2015-07-28 ENCOUNTER — Emergency Department (HOSPITAL_COMMUNITY)
Admission: EM | Admit: 2015-07-28 | Discharge: 2015-07-28 | Disposition: A | Discharge status: Home / Self Care | Payer: 59 | Attending: Emergency Medicine | Admitting: Emergency Medicine

## 2015-07-28 ENCOUNTER — Emergency Department (HOSPITAL_COMMUNITY): Payer: 59

## 2015-07-28 ENCOUNTER — Encounter (HOSPITAL_COMMUNITY): Payer: Self-pay | Admitting: *Deleted

## 2015-07-28 DIAGNOSIS — S29012A Strain of muscle and tendon of back wall of thorax, initial encounter: Secondary | ICD-10-CM | POA: Insufficient documentation

## 2015-07-28 DIAGNOSIS — Z8719 Personal history of other diseases of the digestive system: Secondary | ICD-10-CM | POA: Insufficient documentation

## 2015-07-28 DIAGNOSIS — S199XXA Unspecified injury of neck, initial encounter: Secondary | ICD-10-CM | POA: Diagnosis present

## 2015-07-28 DIAGNOSIS — Z79899 Other long term (current) drug therapy: Secondary | ICD-10-CM | POA: Insufficient documentation

## 2015-07-28 DIAGNOSIS — J45909 Unspecified asthma, uncomplicated: Secondary | ICD-10-CM | POA: Insufficient documentation

## 2015-07-28 DIAGNOSIS — S0990XA Unspecified injury of head, initial encounter: Secondary | ICD-10-CM | POA: Diagnosis not present

## 2015-07-28 DIAGNOSIS — F17210 Nicotine dependence, cigarettes, uncomplicated: Secondary | ICD-10-CM | POA: Insufficient documentation

## 2015-07-28 DIAGNOSIS — Y998 Other external cause status: Secondary | ICD-10-CM | POA: Diagnosis not present

## 2015-07-28 DIAGNOSIS — Y9389 Activity, other specified: Secondary | ICD-10-CM | POA: Diagnosis not present

## 2015-07-28 DIAGNOSIS — Y92410 Unspecified street and highway as the place of occurrence of the external cause: Secondary | ICD-10-CM | POA: Insufficient documentation

## 2015-07-28 DIAGNOSIS — S46811A Strain of other muscles, fascia and tendons at shoulder and upper arm level, right arm, initial encounter: Secondary | ICD-10-CM

## 2015-07-28 DIAGNOSIS — M546 Pain in thoracic spine: Secondary | ICD-10-CM

## 2015-07-28 MED ORDER — NAPROXEN 375 MG PO TABS: 375.0000 mg | ORAL_TABLET | Freq: Two times a day (BID) | ORAL | Status: DC

## 2015-07-28 MED ORDER — NAPROXEN 250 MG PO TABS
375.0000 mg | ORAL_TABLET | Freq: Once | ORAL | Status: AC
  Administered 2015-07-28: 375 mg via ORAL
  Filled 2015-07-28: qty 2

## 2015-07-28 MED ORDER — DIAZEPAM 5 MG PO TABS: 5.0000 mg | ORAL_TABLET | Freq: Two times a day (BID) | ORAL | Status: DC

## 2015-07-28 MED ORDER — BUPIVACAINE HCL (PF) 0.5 % IJ SOLN
10.0000 mL | Freq: Once | INTRAMUSCULAR | Status: AC
  Administered 2015-07-28: 10 mL
  Filled 2015-07-28: qty 30

## 2015-07-28 NOTE — Discharge Instructions (Signed)
Use ice and heat as needed. Take naproxen and Tylenol for pain. Use Valium for significant muscle spasms can make you sleepy no driving.  If you were given medicines take as directed.  If you are on coumadin or contraceptives realize their levels and effectiveness is altered by many different medicines.  If you have any reaction (rash, tongues swelling, other) to the medicines stop taking and see a physician.    If your blood pressure was elevated in the ER make sure you follow up for management with a primary doctor or return for chest pain, shortness of breath or stroke symptoms.  Please follow up as directed and return to the ER or see a physician for new or worsening symptoms.  Thank you. Filed Vitals:   07/28/15 0822  BP: 130/65  Pulse: 66  Temp: 99 F (37.2 C)  TempSrc: Oral  Resp: 14  Height: 5' (1.524 m)  Weight: 106 lb (48.081 kg)  SpO2: 98%
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
## 2015-08-03 ENCOUNTER — Emergency Department (HOSPITAL_COMMUNITY)
Admission: EM | Admit: 2015-08-03 | Discharge: 2015-08-03 | Disposition: A | Discharge status: Home / Self Care | Payer: 59 | Attending: Emergency Medicine | Admitting: Emergency Medicine

## 2015-08-03 ENCOUNTER — Encounter (HOSPITAL_COMMUNITY): Payer: Self-pay | Admitting: Emergency Medicine

## 2015-08-03 ENCOUNTER — Emergency Department (HOSPITAL_COMMUNITY): Payer: 59

## 2015-08-03 DIAGNOSIS — Z7901 Long term (current) use of anticoagulants: Secondary | ICD-10-CM | POA: Insufficient documentation

## 2015-08-03 DIAGNOSIS — F17210 Nicotine dependence, cigarettes, uncomplicated: Secondary | ICD-10-CM | POA: Diagnosis not present

## 2015-08-03 DIAGNOSIS — R0602 Shortness of breath: Secondary | ICD-10-CM | POA: Insufficient documentation

## 2015-08-03 DIAGNOSIS — Z79899 Other long term (current) drug therapy: Secondary | ICD-10-CM | POA: Diagnosis not present

## 2015-08-03 DIAGNOSIS — R079 Chest pain, unspecified: Secondary | ICD-10-CM | POA: Diagnosis present

## 2015-08-03 DIAGNOSIS — J45909 Unspecified asthma, uncomplicated: Secondary | ICD-10-CM | POA: Insufficient documentation

## 2015-08-03 DIAGNOSIS — Z791 Long term (current) use of non-steroidal anti-inflammatories (NSAID): Secondary | ICD-10-CM | POA: Insufficient documentation

## 2015-08-03 DIAGNOSIS — Z792 Long term (current) use of antibiotics: Secondary | ICD-10-CM | POA: Insufficient documentation

## 2015-08-03 LAB — CBC WITH DIFFERENTIAL/PLATELET
Basophils Absolute: 0 10*3/uL (ref 0.0–0.1)
Basophils Relative: 0 %
Eosinophils Absolute: 0.3 10*3/uL (ref 0.0–0.7)
Eosinophils Relative: 3 %
HCT: 40.6 % (ref 36.0–46.0)
Hemoglobin: 13.2 g/dL (ref 12.0–15.0)
Lymphocytes Relative: 16 %
Lymphs Abs: 1.5 10*3/uL (ref 0.7–4.0)
MCH: 27.5 pg (ref 26.0–34.0)
MCHC: 32.5 g/dL (ref 30.0–36.0)
MCV: 84.6 fL (ref 78.0–100.0)
Monocytes Absolute: 0.6 10*3/uL (ref 0.1–1.0)
Monocytes Relative: 6 %
Neutro Abs: 6.9 10*3/uL (ref 1.7–7.7)
Neutrophils Relative %: 75 %
Platelets: 266 10*3/uL (ref 150–400)
RBC: 4.8 MIL/uL (ref 3.87–5.11)
RDW: 15.3 % (ref 11.5–15.5)
WBC: 9.3 10*3/uL (ref 4.0–10.5)

## 2015-08-03 LAB — COMPREHENSIVE METABOLIC PANEL
ALT: 12 U/L — ABNORMAL LOW (ref 14–54)
AST: 18 U/L (ref 15–41)
Albumin: 4.1 g/dL (ref 3.5–5.0)
Alkaline Phosphatase: 42 U/L (ref 38–126)
Anion gap: 7 (ref 5–15)
BUN: 7 mg/dL (ref 6–20)
CO2: 25 mmol/L (ref 22–32)
Calcium: 8.5 mg/dL — ABNORMAL LOW (ref 8.9–10.3)
Chloride: 105 mmol/L (ref 101–111)
Creatinine, Ser: 0.61 mg/dL (ref 0.44–1.00)
GFR calc Af Amer: >60 mL/min (ref >60)
GFR calc non Af Amer: >60 mL/min (ref >60)
Glucose, Bld: 91 mg/dL (ref 65–99)
Potassium: 3.9 mmol/L (ref 3.5–5.1)
Sodium: 137 mmol/L (ref 135–145)
Total Bilirubin: 0.5 mg/dL (ref 0.3–1.2)
Total Protein: 7.2 g/dL (ref 6.5–8.1)

## 2015-08-03 LAB — TROPONIN I
Troponin I: 0.04 ng/mL — ABNORMAL HIGH (ref <0.031)
Troponin I: 0.05 ng/mL — ABNORMAL HIGH (ref <0.031)

## 2015-08-03 LAB — D-DIMER, QUANTITATIVE (NOT AT ARMC): D-Dimer, Quant: 0.29 ug/mL-FEU (ref 0.00–0.50)

## 2015-08-03 MED ORDER — KETOROLAC TROMETHAMINE 30 MG/ML IJ SOLN
30.0000 mg | Freq: Once | INTRAMUSCULAR | Status: AC
  Administered 2015-08-03: 30 mg via INTRAVENOUS
  Filled 2015-08-03: qty 1

## 2015-08-03 NOTE — ED Notes (Signed)
Patient c/o mid-sternal, non-radiating chest pain that started this morning. Patient reports shortness of breath and back pain. Denies pain radiating to back. Denies any cardiac hx.

## 2015-08-03 NOTE — ED Notes (Signed)
MD at bedside. 

## 2015-08-03 NOTE — Discharge Instructions (Signed)
[facility] Doctor List ° ° ° °Edward Hawkins MD. Specialty: Pulmonary Disease Contact information: [address]  °[address]  °[place] [address]  °[phone]  ° °Margaret Simpson, MD. Specialty: Family Medicine Contact information: [address]  °[place] [address]  °[phone]  ° °Scott Luking, MD. Specialty: Family Medicine Contact information: [address]  °[address]  °[place] [address]  °[phone]  ° °Tesfaye Fanta, MD Specialty: Internal Medicine Contact information: [address]  °[place] [address]  °[phone]  ° °Zach Hall, MD. Specialty: Internal Medicine Contact information: [address]  °[place] [address]  °[phone]  ° °Angus Mcinnis, MD. Specialty: Family Medicine Contact information: [address]  °[place] [address]  °[phone]  ° °Stephen Knowlton, MD. Specialty: Family Medicine Contact information: [address]  °[address]  °[place] [address]  °[phone]  ° °Roy Fagan, MD. Specialty: Internal Medicine Contact information: [address]  °[address]  °  [address]  °[phone]  ° ° °
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
## 2016-03-22 ENCOUNTER — Encounter (HOSPITAL_COMMUNITY): Payer: Self-pay | Admitting: Emergency Medicine

## 2016-03-22 ENCOUNTER — Ambulatory Visit (HOSPITAL_COMMUNITY)
Admission: EM | Admit: 2016-03-22 | Discharge: 2016-03-22 | Disposition: A | Discharge status: Home / Self Care | Payer: 59 | Attending: Family Medicine | Admitting: Family Medicine

## 2016-03-22 DIAGNOSIS — B9689 Other specified bacterial agents as the cause of diseases classified elsewhere: Secondary | ICD-10-CM

## 2016-03-22 DIAGNOSIS — N760 Acute vaginitis: Secondary | ICD-10-CM

## 2016-03-22 DIAGNOSIS — R0981 Nasal congestion: Secondary | ICD-10-CM | POA: Diagnosis not present

## 2016-03-22 MED ORDER — CLINDAMYCIN HCL 150 MG PO CAPS: 150.0000 mg | ORAL_CAPSULE | Freq: Four times a day (QID) | ORAL | 0 refills | Status: DC

## 2016-03-22 MED ORDER — CLINDAMYCIN HCL 150 MG PO CAPS: 150.0000 mg | ORAL_CAPSULE | Freq: Four times a day (QID) | ORAL | 1 refills | Status: DC

## 2016-03-22 NOTE — ED Triage Notes (Signed)
Multiple complaints.  Patient complains of sinus symptoms.  Patient reports this morning throat felt itchy, throat sore.  Patient also has stuffy nose and bilateral ear pain.    Patient also complains of vaginal discharge.  Patient says she gets frequent episodes of bacterial vaginosis.  Denies urinary symptoms.  Patient reports she has bacterial vaginosis symptoms every few months.

## 2016-03-22 NOTE — ED Provider Notes (Signed)
[facility]    CSN: [id number] Arrival date & time: 03/22/16  1846     History   Chief Complaint Chief Complaint  Patient presents with  . URI  . Vaginal Discharge    HPI Madeline Davis is a 25 y.o. female.   This is a 25 year old woman who comes in with sinus congestion and vaginal discharge. She has a history of recurrent bacterial vaginitis.  Patient's vaginal symptoms began last Friday, 3 days ago. Her upper respiratory sinus congestion began today. Patient has same-sex partner and is not worried about STDs. She smokes.  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      Past Medical History:  Diagnosis Date  . Asthma   . Constipation 08/12/2014    Patient Active Problem List   Diagnosis Date Noted  . Constipation 08/12/2014    History reviewed. No pertinent surgical history.  OB History    Gravida Para Term Preterm AB Living   1       1 0   SAB TAB Ectopic Multiple Live Births   1               Home Medications    Prior to Admission medications   Medication Sig Start Date End Date Taking? Authorizing Provider  albuterol (PROVENTIL HFA;VENTOLIN HFA) 108 (90 BASE) MCG/ACT inhaler Inhale 2 puffs into the lungs every 4 (four) hours as needed for wheezing. Patient not taking: Reported on 03/22/2016 10/07/14   Tonye Pearsonobert P Doolittle, MD  azithromycin [facility](ZITHROMAX) 250 MG tablet As packaged Patient not taking: Reported on 03/22/2016 10/07/14   Tonye Pearsonobert P Doolittle, MD  cyclobenzaprine (FLEXERIL) 5 MG tablet Take 1 tablet (5 mg total) by mouth 3 (three) times daily as needed for muscle spasms. 07/23/15   Burgess AmorJulie Idol, PA-C  diazepam (VALIUM) 5 MG tablet Take 1 tablet (5 mg total) by mouth 2 (two) times daily. Patient not taking: Reported on 03/22/2016 07/28/15   Blane OharaJoshua Zavitz, MD  HYDROcodone-acetaminophen (NORCO/VICODIN) 5-325 MG tablet Take 1 tablet by mouth every 4 (four) hours as needed. Patient taking differently: Take 1 tablet by mouth every 4 (four) hours as needed for  moderate pain.  07/23/15   Burgess AmorJulie Idol, PA-C  ibuprofen (ADVIL,MOTRIN) 600 MG tablet Take 1 tablet (600 mg total) by mouth every 6 (six) hours as needed. Patient taking differently: Take 600 mg by mouth every 6 (six) hours as needed for mild pain.  07/23/15   Burgess AmorJulie Idol, PA-C  naproxen (NAPROSYN) 375 MG tablet Take 1 tablet (375 mg total) by mouth 2 (two) times daily with a meal. Patient not taking: Reported on 03/22/2016 07/28/15   Blane OharaJoshua Zavitz, MD    Family History Family History  Problem Relation Age of Onset  . Hypertension Mother   . Hypertension Father   . Hyperlipidemia Father   . Stroke Father   . Alzheimer's disease Maternal Grandmother   . Alzheimer's disease Maternal Grandfather     Social History Social History  Substance Use Topics  . Smoking status: Current Every Day Smoker    Packs/day: 0.50    Years: 8.00    Types: Cigarettes  . Smokeless tobacco: Never Used  . Alcohol use Yes     Comment: occasionally     Allergies   Review of patient's allergies indicates no known allergies.   Review of Systems Review of Systems  Constitutional: Negative for chills, diaphoresis, fatigue and fever.  HENT: Positive for congestion, rhinorrhea and sinus pressure. Negative for sore throat.  Eyes: Negative.   Respiratory: Negative.   Cardiovascular: Negative.   Gastrointestinal: Negative.   Genitourinary: Positive for vaginal discharge.  Musculoskeletal: Negative.      Physical Exam Triage Vital Signs ED Triage Vitals [03/22/16 1908]  Enc Vitals Group     BP 127/70     Pulse Rate 69     Resp 18     Temp 98.6 F (37 C)     Temp Source Oral     SpO2 98 %     Weight      Height      Head Circumference      Peak Flow      Pain Score      Pain Loc      Pain Edu?      Excl. in GC?    No data found.   Updated Vital Signs BP 127/70 (BP Location: Left Arm)   Pulse 69   Temp 98.6 F (37 C) (Oral)   Resp 18   SpO2 98%     Physical Exam  Constitutional:  She is oriented to person, place, and time. She appears well-developed and well-nourished.  HENT:  Head: Normocephalic.  Right Ear: External ear normal.  Left Ear: External ear normal.  Mouth/Throat: Oropharynx is clear and moist.  Mild mucopurulent discharge in both nasal passages  Eyes: Conjunctivae and EOM are normal. Pupils are equal, round, and reactive to light.  Neck: Normal range of motion. Neck supple.  Cardiovascular: Normal rate and regular rhythm.   Pulmonary/Chest: Effort normal and breath sounds normal.  Lymphadenopathy:    She has no cervical adenopathy.  Neurological: She is alert and oriented to person, place, and time.  Skin: Skin is warm and dry.  Psychiatric: She has a normal mood and affect. Her behavior is normal.  Nursing note and vitals reviewed.    UC Treatments / Results  Labs (all labs ordered are listed, but only abnormal results are displayed) Labs Reviewed - No data to display  EKG  EKG Interpretation None       Radiology No results found.  Procedures Procedures (including critical care time)  Medications Ordered in UC Medications - No data to display   Initial Impression / Assessment and Plan / UC Course  I have reviewed the triage vital signs and the nursing notes.  Pertinent labs & imaging results that were available during my care of the patient were reviewed by me and considered in my medical decision making (see chart for details).  Clinical Course    Final Clinical Impressions(s) / UC Diagnoses   Final diagnoses:  None    New Prescriptions New Prescriptions   No medications on file     Elvina Sidle, MD 03/22/16 1943

## 2016-03-22 NOTE — Discharge Instructions (Signed)
Consider taking probiotics to prevent future bacterial vaginitis:  two brands are Align and Culturelle

## 2016-08-13 ENCOUNTER — Other Ambulatory Visit: Payer: 59 | Admitting: Adult Health

## 2016-08-25 ENCOUNTER — Encounter: Payer: Self-pay | Admitting: Adult Health

## 2016-08-25 ENCOUNTER — Other Ambulatory Visit (HOSPITAL_COMMUNITY)
Admission: RE | Admit: 2016-08-25 | Discharge: 2016-08-25 | Disposition: A | Discharge status: Home / Self Care | Payer: 59 | Source: Ambulatory Visit | Attending: Adult Health | Admitting: Adult Health

## 2016-08-25 ENCOUNTER — Ambulatory Visit (INDEPENDENT_AMBULATORY_CARE_PROVIDER_SITE_OTHER): Payer: 59 | Admitting: Adult Health

## 2016-08-25 VITALS — BP 112/74 | HR 68 | Ht 61.5 in | Wt 106.0 lb

## 2016-08-25 DIAGNOSIS — R52 Pain, unspecified: Secondary | ICD-10-CM | POA: Diagnosis not present

## 2016-08-25 DIAGNOSIS — R5383 Other fatigue: Secondary | ICD-10-CM

## 2016-08-25 DIAGNOSIS — Z131 Encounter for screening for diabetes mellitus: Secondary | ICD-10-CM

## 2016-08-25 DIAGNOSIS — Z01419 Encounter for gynecological examination (general) (routine) without abnormal findings: Secondary | ICD-10-CM

## 2016-08-25 DIAGNOSIS — Z113 Encounter for screening for infections with a predominantly sexual mode of transmission: Secondary | ICD-10-CM | POA: Insufficient documentation

## 2016-08-25 NOTE — Progress Notes (Addendum)
Patient ID: Madeline Davis, female   DOB: [birth year], 26 y.o.   MRN: [id number] History of Present Illness: Madeline Davis is a 26 year old black female in for well woman gyn exam and pap.She works at [employer].   Current Medications, Allergies, Past Medical History, Past Surgical History, Family History and Social History were reviewed in [employer] record.     Review of Systems: Patient denies any headaches, hearing loss, blurred vision, shortness of breath, chest pain, abdominal pain, problems with bowel movements, urination, or intercourse. No joint pain or mood swings.No problems with periods.  +fatigue and body aches, esp knees, swelling in hands and feet in am   Physical Exam:BP 112/74 (BP Location: Left Arm, Patient Position: Sitting, Cuff Size: Normal)   Pulse 68   Ht 5' 1.5" (1.562 m)   Wt 106 lb (48.1 kg)   LMP 08/18/2016 (Exact Date)   BMI 19.70 kg/m  General:  Well developed, well nourished, no acute distress Skin:  Warm and dry Neck:  Midline trachea, normal thyroid, good ROM, no lymphadenopathy Lungs; Clear to auscultation bilaterally Breast:  No dominant palpable mass, retraction, or nipple discharge Cardiovascular: Regular rate and rhythm Abdomen:  Soft, non tender, no hepatosplenomegaly Pelvic:  External genitalia is normal in appearance, no lesions.  The vagina is normal in appearance. Urethra has no lesions or masses. The cervix is smooth, pap with GC/CHL performed.  Uterus is felt to be normal size, shape, and contour.  No adnexal masses or tenderness noted.Bladder is non tender, no masses felt. Extremities/musculoskeletal:  No swelling or varicosities noted, no clubbing or cyanosis Psych:  No mood changes, alert and cooperative,seems happy PHQ 2 score 0. Will check labs today and talk next week.  Impression:  1. Encounter for gynecological examination with Papanicolaou smear of cervix   2. Fatigue, unspecified type   3. Screening for diabetes mellitus    4.       Body Aches    Plan: Check CBC,CMP,TSH and lipids,A1c and vitamin D, ESR ,RF and ANA  Physical in 1 year Pap in 3 if normal

## 2016-08-26 LAB — CBC
Hematocrit: 36.9 % (ref 34.0–46.6)
Hemoglobin: 11.9 g/dL (ref 11.1–15.9)
MCH: 27.3 pg (ref 26.6–33.0)
MCHC: 32.2 g/dL (ref 31.5–35.7)
MCV: 85 fL (ref 79–97)
Platelets: 356 10*3/uL (ref 150–379)
RBC: 4.36 x10E6/uL (ref 3.77–5.28)
RDW: 15.5 % — ABNORMAL HIGH (ref 12.3–15.4)
WBC: 7 10*3/uL (ref 3.4–10.8)

## 2016-08-26 LAB — LIPID PANEL
Chol/HDL Ratio: 4.2 ratio units (ref 0.0–4.4)
Cholesterol, Total: 148 mg/dL (ref 100–199)
HDL: 35 mg/dL — ABNORMAL LOW (ref >39)
LDL Calculated: 100 mg/dL — ABNORMAL HIGH (ref 0–99)
Triglycerides: 66 mg/dL (ref 0–149)
VLDL Cholesterol Cal: 13 mg/dL (ref 5–40)

## 2016-08-26 LAB — COMPREHENSIVE METABOLIC PANEL
ALT: 7 IU/L (ref 0–32)
AST: 10 IU/L (ref 0–40)
Albumin/Globulin Ratio: 1.7 (ref 1.2–2.2)
Albumin: 4 g/dL (ref 3.5–5.5)
Alkaline Phosphatase: 58 IU/L (ref 39–117)
BUN/Creatinine Ratio: 12 (ref 9–23)
BUN: 6 mg/dL (ref 6–20)
Bilirubin Total: 0.3 mg/dL (ref 0.0–1.2)
CO2: 24 mmol/L (ref 18–29)
Calcium: 8.6 mg/dL — ABNORMAL LOW (ref 8.7–10.2)
Chloride: 102 mmol/L (ref 96–106)
Creatinine, Ser: 0.5 mg/dL — ABNORMAL LOW (ref 0.57–1.00)
GFR calc Af Amer: 154 mL/min/{1.73_m2} (ref >59)
GFR calc non Af Amer: 134 mL/min/{1.73_m2} (ref >59)
Globulin, Total: 2.4 g/dL (ref 1.5–4.5)
Glucose: 76 mg/dL (ref 65–99)
Potassium: 4.1 mmol/L (ref 3.5–5.2)
Sodium: 140 mmol/L (ref 134–144)
Total Protein: 6.4 g/dL (ref 6.0–8.5)

## 2016-08-26 LAB — VITAMIN D 25 HYDROXY (VIT D DEFICIENCY, FRACTURES): Vit D, 25-Hydroxy: 8.3 ng/mL — ABNORMAL LOW (ref 30.0–100.0)

## 2016-08-26 LAB — TSH: TSH: 2.23 u[IU]/mL (ref 0.450–4.500)

## 2016-08-26 LAB — ANA: Anti Nuclear Antibody(ANA): NEGATIVE

## 2016-08-26 LAB — RHEUMATOID FACTOR: Rhuematoid fact SerPl-aCnc: <10 IU/mL (ref 0.0–13.9)

## 2016-08-26 LAB — HEMOGLOBIN A1C
Est. average glucose Bld gHb Est-mCnc: 103 mg/dL
Hgb A1c MFr Bld: 5.2 % (ref 4.8–5.6)

## 2016-08-26 LAB — SEDIMENTATION RATE: Sed Rate: 3 mm/hr (ref 0–32)

## 2016-08-30 ENCOUNTER — Encounter: Payer: Self-pay | Admitting: Adult Health

## 2016-08-30 ENCOUNTER — Telehealth: Payer: Self-pay | Admitting: *Deleted

## 2016-08-30 ENCOUNTER — Telehealth: Payer: Self-pay | Admitting: Adult Health

## 2016-08-30 DIAGNOSIS — E559 Vitamin D deficiency, unspecified: Secondary | ICD-10-CM

## 2016-08-30 HISTORY — DX: Vitamin D deficiency, unspecified: E55.9

## 2016-08-30 LAB — CYTOLOGY - PAP
Adequacy: ABSENT
Chlamydia: NEGATIVE
Diagnosis: NEGATIVE
Neisseria Gonorrhea: NEGATIVE

## 2016-08-30 MED ORDER — CHOLECALCIFEROL 125 MCG (5000 UT) PO CAPS: 5000.0000 [IU] | ORAL_CAPSULE | Freq: Every day | ORAL | Status: DC

## 2016-08-30 NOTE — Telephone Encounter (Signed)
Patient called wanting lab results. Informed patient that some labs were abnormal but Victorino Dike usually calls to talk with patients about their results. Pt verbalized understanding.

## 2016-08-30 NOTE — Telephone Encounter (Signed)
Left message that pap was negative, with negative GC/CHL and that vitamin D was really low and needs to take vitamin D 3 5000 IU daily, other labs were good, and she called back and I told her all this in person.

## 2016-08-31 ENCOUNTER — Other Ambulatory Visit: Payer: 59 | Admitting: Adult Health

## 2016-09-13 ENCOUNTER — Emergency Department (HOSPITAL_COMMUNITY)
Admission: EM | Admit: 2016-09-13 | Discharge: 2016-09-13 | Disposition: A | Discharge status: Home / Self Care | Payer: 59 | Attending: Emergency Medicine | Admitting: Emergency Medicine

## 2016-09-13 ENCOUNTER — Encounter (HOSPITAL_COMMUNITY): Payer: Self-pay | Admitting: Emergency Medicine

## 2016-09-13 DIAGNOSIS — J45909 Unspecified asthma, uncomplicated: Secondary | ICD-10-CM | POA: Insufficient documentation

## 2016-09-13 DIAGNOSIS — J069 Acute upper respiratory infection, unspecified: Secondary | ICD-10-CM

## 2016-09-13 DIAGNOSIS — Z79899 Other long term (current) drug therapy: Secondary | ICD-10-CM | POA: Diagnosis not present

## 2016-09-13 DIAGNOSIS — Z5321 Procedure and treatment not carried out due to patient leaving prior to being seen by health care provider: Secondary | ICD-10-CM | POA: Insufficient documentation

## 2016-09-13 DIAGNOSIS — F17210 Nicotine dependence, cigarettes, uncomplicated: Secondary | ICD-10-CM | POA: Insufficient documentation

## 2016-09-13 DIAGNOSIS — J029 Acute pharyngitis, unspecified: Secondary | ICD-10-CM | POA: Diagnosis present

## 2016-09-13 NOTE — ED Triage Notes (Signed)
Pt reports generalized body aches with sore throat since yesterday.

## 2016-09-13 NOTE — ED Notes (Signed)
Patient came from room walking to exit. States she is leaving.

## 2016-10-26 ENCOUNTER — Emergency Department (HOSPITAL_COMMUNITY): Payer: 59

## 2016-10-26 ENCOUNTER — Emergency Department (HOSPITAL_COMMUNITY)
Admission: EM | Admit: 2016-10-26 | Discharge: 2016-10-26 | Disposition: A | Discharge status: Home / Self Care | Payer: 59 | Attending: Emergency Medicine | Admitting: Emergency Medicine

## 2016-10-26 ENCOUNTER — Encounter (HOSPITAL_COMMUNITY): Payer: Self-pay | Admitting: Emergency Medicine

## 2016-10-26 DIAGNOSIS — S6992XA Unspecified injury of left wrist, hand and finger(s), initial encounter: Secondary | ICD-10-CM

## 2016-10-26 DIAGNOSIS — F17210 Nicotine dependence, cigarettes, uncomplicated: Secondary | ICD-10-CM | POA: Insufficient documentation

## 2016-10-26 DIAGNOSIS — S61307A Unspecified open wound of left little finger with damage to nail, initial encounter: Secondary | ICD-10-CM | POA: Diagnosis not present

## 2016-10-26 DIAGNOSIS — J45909 Unspecified asthma, uncomplicated: Secondary | ICD-10-CM | POA: Diagnosis not present

## 2016-10-26 DIAGNOSIS — Y999 Unspecified external cause status: Secondary | ICD-10-CM | POA: Diagnosis not present

## 2016-10-26 DIAGNOSIS — W19XXXA Unspecified fall, initial encounter: Secondary | ICD-10-CM

## 2016-10-26 DIAGNOSIS — W1839XA Other fall on same level, initial encounter: Secondary | ICD-10-CM | POA: Insufficient documentation

## 2016-10-26 DIAGNOSIS — Y939 Activity, unspecified: Secondary | ICD-10-CM | POA: Insufficient documentation

## 2016-10-26 DIAGNOSIS — Y929 Unspecified place or not applicable: Secondary | ICD-10-CM | POA: Insufficient documentation

## 2016-10-26 MED ORDER — TRAMADOL HCL 50 MG PO TABS: 50.0000 mg | ORAL_TABLET | Freq: Three times a day (TID) | ORAL | 0 refills | Status: AC | PRN

## 2016-10-26 NOTE — ED Triage Notes (Signed)
Pt fell 4 days ago. Pt has actrylic nails on and left pinky nail coming off due to fall. Nad. Pain only with movement

## 2016-10-26 NOTE — ED Provider Notes (Signed)
[facility] Provider Note   CSN: [id number] Arrival date & time: 10/26/16  1117     History   Chief Complaint Chief Complaint  Patient presents with  . Fall    HPI Madeline Davis is a 26 y.o. female.  HPI  Patient presents to ED after fall. PMH significant for asthma. Patient states that Sunday she was out drinking in the rain and fell in her driveway. When she fell she landed on her hands. Her nails broke on both hands. Her left little finger nail was pulled up with the acrylic nail. It is causing pain and drainage. Patient denies any weakness, loss of sensation. She is RHD. Does not feel like finger is broken but can't extend it all the way.    Past Medical History:  Diagnosis Date  . Asthma   . Constipation 08/12/2014  . Vitamin D deficiency 08/30/2016    Patient Active Problem List   Diagnosis Date Noted  . Vitamin D deficiency 08/30/2016  . Body aches 08/25/2016  . Fatigue 08/25/2016  . Constipation 08/12/2014    History reviewed. No pertinent surgical history.  OB History    Gravida Para Term Preterm AB Living   1       1 0   SAB TAB Ectopic Multiple Live Births   1               Home Medications    Prior to Admission medications   Medication Sig Start Date End Date Taking? Authorizing Provider  albuterol (PROVENTIL HFA;VENTOLIN HFA) 108 (90 BASE) MCG/ACT inhaler Inhale 2 puffs into the lungs every 4 (four) hours as needed for wheezing. 10/07/14   Tonye Pearsonoolittle, Robert P, MD  Cholecalciferol 5000 units capsule Take 1 capsule (5,000 Units total) by mouth daily. 08/30/16   Adline PotterGriffin, Jennifer A, NP  ibuprofen (ADVIL,MOTRIN) 600 MG tablet Take 1 tablet (600 mg total) by mouth every 6 (six) hours as needed. Patient taking differently: Take 600 mg by mouth every 6 (six) hours as needed for mild pain.  07/23/15   Burgess AmorIdol, Julie, PA-C  naproxen (NAPROSYN) 375 MG tablet Take 1 tablet (375 mg total) by mouth 2 (two) times daily with a meal. 07/28/15   Blane OharaZavitz, Joshua, MD     Family History Family History  Problem Relation Age of Onset  . Hypertension Mother   . Hypertension Father   . Hyperlipidemia Father   . Stroke Father   . Alzheimer's disease Maternal Grandmother   . Alzheimer's disease Maternal Grandfather     Social History Social History  Substance Use Topics  . Smoking status: Current Every Day Smoker    Packs/day: 0.50    Years: 8.00    Types: Cigarettes  . Smokeless tobacco: Never Used  . Alcohol use Yes     Comment: occasionally     Allergies   Patient has no known allergies.   Review of Systems Review of Systems All systems reviewed and are negative for acute change except as noted in the HPI.  Physical Exam Updated Vital Signs BP 130/75   Pulse 92   Temp 98.5 F (36.9 C) (Oral)   Resp 16   LMP 09/30/2016   SpO2 100%   Physical Exam   ED Treatments / Results  Labs (all labs ordered are listed, but only abnormal results are displayed) Labs Reviewed - No data to display  EKG  EKG Interpretation None       Radiology Dg Finger Little Left  Result Date: 10/26/2016  CLINICAL DATA:  Pain following fall 2 days prior EXAM: LEFT FIFTH FINGER 2+V COMPARISON:  None. FINDINGS: Frontal, oblique, and lateral views obtained. A bandage overlies the ungual region. No other radiopaque foreign body evident. No fracture or dislocation. Joint spaces appear normal. No erosive change. IMPRESSION: Bandage overlying the ungual region. No fracture or dislocation. No evident arthropathy. Electronically Signed   By: Bretta Bang III M.D.   On: 10/26/2016 13:20    Procedures Procedures (including critical care time)  Medications Ordered in ED Medications - No data to display   Initial Impression / Assessment and Plan / ED Course  I have reviewed the triage vital signs and the nursing notes.  Pertinent labs & imaging results that were available during my care of the patient were reviewed by me and considered in my medical  decision making (see chart for details).  Patient presents to ED s/p fall and with nail injury. Vitals are stable and patient is neurovascularly intact. Unable to remove acrylic nail hair. Was able to cut it down. Will buddy tap fingers. Discussed warning signs of infection. Rx given for pain medication. Nail will likely fall off in the near future and discussed this with patient. Stable for discharge.   Final Clinical Impressions(s) / ED Diagnoses   Final diagnoses:  Fall, initial encounter  Injury of nail bed of finger of left hand, initial encounter    New Prescriptions New Prescriptions   No medications on file     Pincus Large, DO 10/26/16 1431    Blane Ohara, MD 10/26/16 1525

## 2016-10-26 NOTE — ED Notes (Signed)
Call, no answer 

## 2016-10-26 NOTE — ED Notes (Signed)
Patient returned from xray.

## 2016-10-26 NOTE — ED Notes (Signed)
Patient transported to x-ray. ?

## 2016-10-26 NOTE — ED Notes (Signed)
Not in waiting area x 2 

## 2016-10-26 NOTE — Discharge Instructions (Signed)
Please allow time for nail to fall off in a few weeks Monitor for signs of infection Soak nail daily in warm water  Follow-up with PCP for monitoring

## 2016-11-22 ENCOUNTER — Encounter (HOSPITAL_COMMUNITY): Payer: Self-pay | Admitting: Emergency Medicine

## 2016-11-22 ENCOUNTER — Ambulatory Visit (HOSPITAL_COMMUNITY)
Admission: EM | Admit: 2016-11-22 | Discharge: 2016-11-22 | Disposition: A | Discharge status: Home / Self Care | Payer: 59 | Attending: Family Medicine | Admitting: Family Medicine

## 2016-11-22 DIAGNOSIS — R3915 Urgency of urination: Secondary | ICD-10-CM

## 2016-11-22 DIAGNOSIS — R350 Frequency of micturition: Secondary | ICD-10-CM | POA: Diagnosis not present

## 2016-11-22 DIAGNOSIS — R102 Pelvic and perineal pain: Secondary | ICD-10-CM

## 2016-11-22 LAB — POCT URINALYSIS DIP (DEVICE)
Bilirubin Urine: NEGATIVE
Glucose, UA: NEGATIVE mg/dL
Hgb urine dipstick: NEGATIVE
Ketones, ur: NEGATIVE mg/dL
Leukocytes, UA: NEGATIVE
Nitrite: NEGATIVE
Protein, ur: NEGATIVE mg/dL
Specific Gravity, Urine: 1.02 (ref 1.005–1.030)
Urobilinogen, UA: 1 mg/dL (ref 0.0–1.0)
pH: 6 (ref 5.0–8.0)

## 2016-11-22 MED ORDER — SULFAMETHOXAZOLE-TRIMETHOPRIM 800-160 MG PO TABS: 1.0000 | ORAL_TABLET | Freq: Two times a day (BID) | ORAL | 0 refills | Status: DC

## 2016-11-22 MED ORDER — FLUCONAZOLE 150 MG PO TABS: 150.0000 mg | ORAL_TABLET | Freq: Once | ORAL | 0 refills | Status: AC

## 2016-11-22 NOTE — Discharge Instructions (Signed)
If the problem continues p.m. on Wednesday or worsens, please return

## 2016-11-22 NOTE — ED Triage Notes (Signed)
Pt here for UTI sx onset 4 days associated w/urinary freq/urgency, abd pressure  Denies dysuria, hematuria, fevers, chills  A&O x4... NAD... Ambulatory

## 2016-11-22 NOTE — ED Provider Notes (Signed)
[facility]    CSN: [id number] Arrival date & time: 11/22/16  1433     History   Chief Complaint Chief Complaint  Patient presents with  . Urinary Tract Infection    HPI Madeline Davis is a 26 y.o. female.   Pt here for UTI sx onset 4 days associated w/urinary freq/urgency, abd pressure.  She says the urine has a strong odor.  She gets yeast infections with antibiotics.  Denies dysuria, hematuria, fevers, chills  G1P1  LMP 2 weeks ago.       Past Medical History:  Diagnosis Date  . Asthma   . Constipation 08/12/2014  . Vitamin D deficiency 08/30/2016    Patient Active Problem List   Diagnosis Date Noted  . Vitamin D deficiency 08/30/2016  . Body aches 08/25/2016  . Fatigue 08/25/2016  . Constipation 08/12/2014    History reviewed. No pertinent surgical history.  OB History    Gravida Para Term Preterm AB Living   1       1 0   SAB TAB Ectopic Multiple Live Births   1               Home Medications    Prior to Admission medications   Medication Sig Start Date End Date Taking? Authorizing Provider  albuterol (PROVENTIL HFA;VENTOLIN HFA) 108 (90 BASE) MCG/ACT inhaler Inhale 2 puffs into the lungs every 4 (four) hours as needed for wheezing. 10/07/14   Tonye Pearson, MD  Cholecalciferol 5000 units capsule Take 1 capsule (5,000 Units total) by mouth daily. 08/30/16   Adline Potter, NP  fluconazole (DIFLUCAN) 150 MG tablet Take 1 tablet (150 mg total) by mouth once. Repeat if needed 11/22/16 11/22/16  Elvina Sidle, MD  ibuprofen (ADVIL,MOTRIN) 600 MG tablet Take 1 tablet (600 mg total) by mouth every 6 (six) hours as needed. Patient taking differently: Take 600 mg by mouth every 6 (six) hours as needed for mild pain.  07/23/15   Burgess Amor, PA-C  naproxen (NAPROSYN) 375 MG tablet Take 1 tablet (375 mg total) by mouth 2 (two) times daily with a meal. 07/28/15   Blane Ohara, MD  sulfamethoxazole-trimethoprim (BACTRIM DS,SEPTRA DS) 800-160  MG tablet Take 1 tablet by mouth 2 (two) times daily. 11/22/16   Elvina Sidle, MD  traMADol (ULTRAM) 50 MG tablet Take 1 tablet (50 mg total) by mouth every 8 (eight) hours as needed for moderate pain. 10/26/16 10/26/17  Pincus Large, DO    Family History Family History  Problem Relation Age of Onset  . Hypertension Mother   . Hypertension Father   . Hyperlipidemia Father   . Stroke Father   . Alzheimer's disease Maternal Grandmother   . Alzheimer's disease Maternal Grandfather     Social History Social History  Substance Use Topics  . Smoking status: Current Every Day Smoker    Packs/day: 0.50    Years: 8.00    Types: Cigarettes  . Smokeless tobacco: Never Used  . Alcohol use Yes     Comment: occasionally     Allergies   Patient has no known allergies.   Review of Systems Review of Systems  Genitourinary: Positive for frequency. Negative for vaginal discharge and vaginal pain.  All other systems reviewed and are negative.    Physical Exam Triage Vital Signs ED Triage Vitals [11/22/16 1540]  Enc Vitals Group     BP 107/64     Pulse Rate 72     Resp 16  Temp 98.6 F (37 C)     Temp Source Oral     SpO2 100 %     Weight      Height      Head Circumference      Peak Flow      Pain Score      Pain Loc      Pain Edu?      Excl. in GC?    No data found.   Updated Vital Signs BP 107/64 (BP Location: Left Arm)   Pulse 72   Temp 98.6 F (37 C) (Oral)   Resp 16   LMP 11/08/2016   SpO2 100%    Physical Exam  Constitutional: She is oriented to person, place, and time. She appears well-developed and well-nourished.  HENT:  Right Ear: External ear normal.  Left Ear: External ear normal.  Eyes: Conjunctivae are normal. Pupils are equal, round, and reactive to light.  Neck: Normal range of motion. Neck supple.  Pulmonary/Chest: Effort normal.  Abdominal: Soft. There is no tenderness.  Musculoskeletal: Normal range of motion.  Neurological: She  is alert and oriented to person, place, and time.  Skin: Skin is warm and dry.  Nursing note and vitals reviewed.    UC Treatments / Results  Labs (all labs ordered are listed, but only abnormal results are displayed) Labs Reviewed  POCT URINALYSIS DIP (DEVICE)    EKG  EKG Interpretation None       Radiology No results found.  Procedures Procedures (including critical care time)  Medications Ordered in UC Medications - No data to display   Initial Impression / Assessment and Plan / UC Course  I have reviewed the triage vital signs and the nursing notes.  Pertinent labs & imaging results that were available during my care of the patient were reviewed by me and considered in my medical decision making (see chart for details).      Final Clinical Impressions(s) / UC Diagnoses   Final diagnoses:  Pelvic pressure in female    New Prescriptions New Prescriptions   FLUCONAZOLE (DIFLUCAN) 150 MG TABLET    Take 1 tablet (150 mg total) by mouth once. Repeat if needed   SULFAMETHOXAZOLE-TRIMETHOPRIM (BACTRIM DS,SEPTRA DS) 800-160 MG TABLET    Take 1 tablet by mouth 2 (two) times daily.     Elvina SidleLauenstein, Aliena Ghrist, MD 11/22/16 1606
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
## 2017-08-29 ENCOUNTER — Other Ambulatory Visit: Payer: 59 | Admitting: Adult Health

## 2017-09-21 ENCOUNTER — Ambulatory Visit (INDEPENDENT_AMBULATORY_CARE_PROVIDER_SITE_OTHER): Payer: 59 | Admitting: Adult Health

## 2017-09-21 ENCOUNTER — Encounter: Payer: Self-pay | Admitting: Adult Health

## 2017-09-21 VITALS — BP 100/60 | HR 95 | Ht 64.0 in | Wt 115.0 lb

## 2017-09-21 DIAGNOSIS — Z01419 Encounter for gynecological examination (general) (routine) without abnormal findings: Secondary | ICD-10-CM | POA: Diagnosis not present

## 2017-09-21 DIAGNOSIS — Z113 Encounter for screening for infections with a predominantly sexual mode of transmission: Secondary | ICD-10-CM | POA: Diagnosis not present

## 2017-09-21 MED ORDER — ALBUTEROL SULFATE HFA 108 (90 BASE) MCG/ACT IN AERS
2.0000 | INHALATION_SPRAY | RESPIRATORY_TRACT | 2 refills | Status: DC | PRN
Start: 2017-09-21 — End: 2018-09-28

## 2017-09-21 NOTE — Progress Notes (Signed)
Patient ID: Madeline Davis, female   DOB: Jun 27, 1990, 27 y.o.   MRN: [id number] History of Present Illness: Madeline Davis is a 27 year old black female in for a well woman gyn exam,she had a normal pap with negative HPV, 08/25/16.   Current Medications, Allergies, Past Medical History, Past Surgical History, Family History and Social History were reviewed in [facility] electronic medical record.     Review of Systems:  Patient denies any headaches, hearing loss, fatigue, blurred vision, shortness of breath, chest pain, abdominal pain, problems with bowel movements, urination, or intercourse. No joint pain or mood swings.Is thinking doing IUI,has female partner.    Physical Exam:BP 100/60 (BP Location: Left Arm, Patient Position: Sitting, Cuff Size: Small)   Pulse 95   Ht 5\' 4"  (1.626 m)   Wt 115 lb (52.2 kg)   BMI 19.74 kg/m  General:  Well developed, well nourished, no acute distress Skin:  Warm and dry Neck:  Midline trachea, normal thyroid, good ROM, no lymphadenopathy Lungs; Clear to auscultation bilaterally Breast:  No dominant palpable mass, retraction, or nipple discharge Cardiovascular: Regular rate and rhythm Abdomen:  Soft, non tender, no hepatosplenomegaly Pelvic:  External genitalia is normal in appearance, no lesions.  The vagina is normal in appearance. Urethra has no lesions or masses. The cervix is smooth and nulliparous.  Uterus is felt to be normal size, shape, and contour.  No adnexal masses or tenderness noted.Bladder is non tender, no masses felt. GC/CHL obtained. Extremities/musculoskeletal:  No swelling or varicosities noted, no clubbing or cyanosis Psych:  No mood changes, alert and cooperative,seems happy PHQ 9 score 3.  Impression:  1. Encounter for well woman exam with routine gynecological exam   2. Screening examination for STD (sexually transmitted disease)      Plan: GC/CHL sent Check HIV and RPR Meds ordered this encounter  Medications  . albuterol  (PROVENTIL HFA;VENTOLIN HFA) 108 (90 Base) MCG/ACT inhaler    Sig: Inhale 2 puffs into the lungs every 4 (four) hours as needed for wheezing.    Dispense:  1 Inhaler    Refill:  2    Order Specific Question:   Supervising Provider    Answer:   Duane LopeEURE, LUTHER H [[id number]]  Talk with [facility]

## 2017-09-22 LAB — HIV ANTIBODY (ROUTINE TESTING W REFLEX): HIV Screen 4th Generation wRfx: NONREACTIVE

## 2017-09-22 LAB — RPR: RPR Ser Ql: NONREACTIVE

## 2017-09-23 LAB — GC/CHLAMYDIA PROBE AMP
Chlamydia trachomatis, NAA: NEGATIVE
Neisseria gonorrhoeae by PCR: NEGATIVE

## 2018-04-19 DIAGNOSIS — N760 Acute vaginitis: Secondary | ICD-10-CM | POA: Diagnosis not present

## 2018-04-19 DIAGNOSIS — Z113 Encounter for screening for infections with a predominantly sexual mode of transmission: Secondary | ICD-10-CM | POA: Diagnosis not present

## 2018-08-01 ENCOUNTER — Encounter: Payer: Self-pay | Admitting: Obstetrics & Gynecology

## 2018-08-01 ENCOUNTER — Ambulatory Visit: Payer: No Typology Code available for payment source | Admitting: Obstetrics & Gynecology

## 2018-08-01 VITALS — BP 131/84 | HR 116 | Ht 60.0 in | Wt 113.5 lb

## 2018-08-01 DIAGNOSIS — N760 Acute vaginitis: Secondary | ICD-10-CM

## 2018-08-01 DIAGNOSIS — Z113 Encounter for screening for infections with a predominantly sexual mode of transmission: Secondary | ICD-10-CM | POA: Diagnosis not present

## 2018-08-01 DIAGNOSIS — B9689 Other specified bacterial agents as the cause of diseases classified elsewhere: Secondary | ICD-10-CM | POA: Diagnosis not present

## 2018-08-01 MED ORDER — FLUCONAZOLE 150 MG PO TABS: ORAL_TABLET | ORAL | 0 refills | Status: DC

## 2018-08-01 MED ORDER — METRONIDAZOLE 0.75 % VA GEL: VAGINAL | 0 refills | Status: DC

## 2018-08-01 NOTE — Progress Notes (Signed)
       Chief Complaint  Patient presents with  . STD screening    Blood pressure 131/84, pulse (!) 116, height 5' (1.524 m), weight 113 lb 8 oz (51.5 kg), last menstrual period 07/10/2018.  27 y.o. G1P0010 Patient's last menstrual period was 07/10/2018 (exact date). The current method of family planning is none.  Subjective Vaginal discharge for 1weeks Itching yes Irritation yes Odor no Similar to previous no  Previous treatment none  Objective Vulva:  normal appearing vulva with no masses, tenderness or lesions Vagina:  normal mucosa, thin grey discharge Cervix:  no cervical motion tenderness and no lesions Uterus:  normal size, contour, position, consistency, mobility, non-tender Adnexa: ovaries:,       Pertinent ROS No burning with urination, frequency or urgency No nausea, vomiting or diarrhea Nor fever chills or other constitutional symptoms   Labs or studies Wet Prep:   A sample of vaginal discharge was obtained from the posterior fornix using a cotton swab. 2 drops of saline were placed on a slide and the cotton swab was immersed in the saline. Microscopic evaluation was performed and results were as follows:  Negative  for yeast  Positive for clue cells , consistent with Bacterial vaginosis Negative for trichomonas  Normal WBC population   Whiff test: Negative     Impression Diagnoses this Encounter::   ICD-10-CM   1. BV (bacterial vaginosis) N76.0    B96.89   2. Screening examination for STD (sexually transmitted disease) Z11.3 GC/Chlamydia Probe Amp    Established relevant diagnosis(es):   Plan/Recommendations: Meds ordered this encounter  Medications  . metroNIDAZOLE (METROGEL VAGINAL) 0.75 % vaginal gel    Sig: 1 applicator daily at bedtime x 5    Dispense:  70 g    Refill:  0  . fluconazole (DIFLUCAN) 150 MG tablet    Sig: Take 1 tablet repeat in 3 days    Dispense:  2 tablet    Refill:  0    Labs or Scans Ordered: Orders  Placed This Encounter  Procedures  . GC/Chlamydia Probe Amp    Management:: >BV: metrogel, will give diflucan script as well  Follow up Return if symptoms worsen or fail to improve.       All questions were answered.

## 2018-08-04 LAB — GC/CHLAMYDIA PROBE AMP
Chlamydia trachomatis, NAA: NEGATIVE
Neisseria gonorrhoeae by PCR: NEGATIVE

## 2018-08-15 ENCOUNTER — Telehealth: Payer: No Typology Code available for payment source | Admitting: Nurse Practitioner

## 2018-08-15 DIAGNOSIS — N3000 Acute cystitis without hematuria: Secondary | ICD-10-CM

## 2018-08-15 MED ORDER — CEPHALEXIN 500 MG PO CAPS: 500.0000 mg | ORAL_CAPSULE | Freq: Two times a day (BID) | ORAL | 0 refills | Status: DC

## 2018-08-15 NOTE — Progress Notes (Signed)

## 2018-08-23 ENCOUNTER — Telehealth: Payer: No Typology Code available for payment source | Admitting: Family

## 2018-08-23 DIAGNOSIS — R0602 Shortness of breath: Secondary | ICD-10-CM

## 2018-08-23 NOTE — Progress Notes (Signed)
Based on what you shared with me, I feel your condition warrants further evaluation and I recommend that you be seen for a face to face office visit.  Since you were just treated for a UTI and are mot having a cough, I think it would be best if you were treated face to face.   Approximately 5 minutes was spent documenting and reviewing patient's chart.     NOTE: If you entered your credit card information for this eVisit, you will not be charged. You may see a "hold" on your card for the $35 but that hold will drop off and you will not have a charge processed.  If you are having a true medical emergency please call 911.  If you need an urgent face to face visit, [facility] has four urgent care centers for your convenience.    PLEASE NOTE: THE [facility]STACARE LOCATIONS AND URGENT CARE CLINICS DO NOT HAVE THE TESTING FOR CORONAVIRUS COVID19 AVAILABLE.  IF YOU FEEL YOU NEED THIS TEST YOU MUST HAVE AN ORDER TO GO TO A TESTING LOCATION FROM YOUR PROVIDER OR FROM A SCREENING E-VISIT     [email or web address] to reserve your spot online an avoid wait times  [facility] [address] [place], [place] [address] 8 am to 8 pm Monday-Friday 10 am to 4 pm Saturday-Sunday *Across the street from [employer]  [address] [place], [address] 8 am to 5 pm Monday-Friday * In the [facility] on the [facility]   The following sites will take your insurance:  . [facility]  [phone] Get Driving Directions Find a Provider at this Location  [address] [place], [place] [address] . 10 am to 8 pm Monday-Friday . 12 pm to 8 pm Saturday-Sunday   . [facility] at [facility]  [phone] Get Driving Directions Find a Provider at this Location  [address], [address] [place], [place] [address] . 8 am to 8 pm Monday-Friday . 9 am to 6 pm Saturday . 11 am to 6 pm Sunday   .  [facility] at [facility]  [phone] Get Driving Directions  [address] [place], [place] [address] . 8 am to 8 pm Monday-Friday . 8 am to 4 pm Saturday-Sunday   Your e-visit answers were reviewed by a board certified advanced clinical practitioner to complete your personal care plan.  Thank you for using e-Visits.

## 2018-08-28 MED FILL — ALBUTEROL SULFATE HFA 108 (: 108 (90 BAS | 16 days supply | Qty: 9 | Fill #0

## 2018-09-28 ENCOUNTER — Telehealth: Payer: No Typology Code available for payment source | Admitting: Physician Assistant

## 2018-09-28 DIAGNOSIS — J4540 Moderate persistent asthma, uncomplicated: Secondary | ICD-10-CM

## 2018-09-28 MED ORDER — ALBUTEROL SULFATE (2.5 MG/3ML) 0.083% IN NEBU: 2.5000 mg | INHALATION_SOLUTION | Freq: Four times a day (QID) | RESPIRATORY_TRACT | 1 refills | Status: DC | PRN

## 2018-09-28 NOTE — Progress Notes (Signed)
I have spent 5 minutes in review of e-visit questionnaire, review and updating patient chart, medical decision making and response to patient.   Trayton Szabo Cody Joyia Riehle, PA-C    

## 2018-09-28 NOTE — Progress Notes (Signed)
E Visit for Asthma  Based on what you have shared with me, it looks like you may have a flare up of your asthma.  Asthma is a chronic (ongoing) lung disease which results in airway obstruction, inflammation and hyper-responsiveness.   Asthma symptoms vary from person to person, with common symptoms including nighttime awakening and decreased ability to participate in normal activities as a result of shortness of breath. It is often triggered by changes in weather, changes in the season, changes in air temperature, or inside (home, school, daycare or work) allergens such as animal dander, mold, mildew, woodstoves or cockroaches.   It can also be triggered by hormonal changes, extreme emotion, physical exertion or an upper respiratory tract illness.     It is important to identify the trigger, and then eliminate or avoid the trigger if possible.   If you have been prescribed medications to be taken on a regular basis, it is important to follow the asthma action plan and to follow guidelines to adjust medication in response to increasing symptoms of decreased peak expiratory flow rate  Treatment: I have prescribed: Albuterol (Proventil) (2.5 mg in 3 mL) 0.083 % Take by nebulization solution every six hours as needed for wheezing or shortness.   I also want you to schedule an in person appointment with a primary care provider to discuss asthma and to be started on a maintenance medication.   HOME CARE . Only take medications as instructed by your medical team. . Consider wearing a mask or scarf to improve breathing air temperature have been shown to decrease irritation and decrease exacerbations . Get rest. . Taking a steamy shower or using a humidifier may help nasal congestion sand ease sore throat pain. You can place a towel over your head and breathe in the steam from hot water  coming from a faucet. . Using a saline nasal spray works much the same way.  . Cough drops, hare candies and sore throat lozenges may ease your cough.  . Avoid close contacts especially the very you and the elderly . Cover your mouth if you cough or sneeze . Always remember to wash your hands.    GET HELP RIGHT AWAY IF: . You develop worsening symptoms; breathlessness at rest, drowsy, confused or agitated, unable to speak in full sentences . You have coughing fits . You develop a severe headache or visual changes . You develop shortness of breath, difficulty breathing or start having chest pain . Your symptoms persist after you have completed your treatment plan . If your symptoms do not improve within 10 days  MAKE SURE YOU . Understand these instructions. . Will watch your condition. . Will get help right away if you are not doing well or get worse.   Your e-visit answers were reviewed by a board certified advanced clinical practitioner to complete your personal care plan, Depending upon the condition, your plan could have included both over the counter or prescription medications.  Please review your pharmacy choice. Your safety is important to [place]. If you have drug allergies check your prescription carefully. You can use MyChart to ask questions about today's visit, request a non-urgent call back, or ask for a work or school excuse for 24 hours related to this e-Visit. If it has been greater than 24 hours you will need to follow up with your provider, or enter a new e-Visit to address those concerns.  You will get an e-mail in the next two days asking about  your experience. I hope that your e-visit has been valuable and will speed your recovery. Thank you for using e-visits.

## 2018-12-05 ENCOUNTER — Telehealth: Payer: No Typology Code available for payment source | Admitting: Nurse Practitioner

## 2018-12-05 DIAGNOSIS — M549 Dorsalgia, unspecified: Secondary | ICD-10-CM

## 2018-12-05 DIAGNOSIS — N3000 Acute cystitis without hematuria: Secondary | ICD-10-CM | POA: Diagnosis not present

## 2018-12-05 DIAGNOSIS — R300 Dysuria: Secondary | ICD-10-CM

## 2018-12-05 MED ORDER — NITROFURANTOIN MONOHYD MACRO 100 MG PO CAPS: 100.0000 mg | ORAL_CAPSULE | Freq: Two times a day (BID) | ORAL | 0 refills | Status: DC

## 2018-12-05 NOTE — Progress Notes (Signed)
Patient submmitted evisit previously and stated she was having back pain with this. I sent patient a message that she needed a face to face visit duet to back. She resubmitted evisit and put that she was not having back pain. I contacted her by Lucretia Kern and she said she did not mean to check that.  We are sorry that you are not feeling well.  Here is how we plan to help!  Based on what you shared with me it looks like you most likely have a simple urinary tract infection.  A UTI (Urinary Tract Infection) is a bacterial infection of the bladder.  Most cases of urinary tract infections are simple to treat but a key part of your care is to encourage you to drink plenty of fluids and watch your symptoms carefully.  I have prescribed MacroBid 100 mg twice a day for 5 days.  Your symptoms should gradually improve. Call us if the burning in your urine worsens, you develop worsening fever, back pain or pelvic pain or if your symptoms do not resolve after completing the antibiotic.  Urinary tract infections can be prevented by drinking plenty of water to keep your body hydrated.  Also be sure when you wipe, wipe from front to back and don't hold it in!  If possible, empty your bladder every 4 hours.  Your e-visit answers were reviewed by a board certified advanced clinical practitioner to complete your personal care plan.  Depending on the condition, your plan could have included both over the counter or prescription medications.  If there is a problem please reply  once you have received a response from your provider.  Your safety is important to [place].  If you have drug allergies check your prescription carefully.    You can use MyChart to ask questions about today's visit, request a non-urgent call back, or ask for a work or school excuse for 24 hours related to this e-Visit. If it has been greater than 24 hours you will need to follow up with your provider, or enter a new e-Visit to address those  concerns.   You will get an e-mail in the next two days asking about your experience.  I hope that your e-visit has been valuable and will speed your recovery. Thank you for using e-visits.  5-10 minutes spent reviewing and documenting in chart.

## 2018-12-05 NOTE — Progress Notes (Signed)
Based on what you shared with me it looks like you have urinary tract infection with back pain.,that should be evaluated in a face to face office visit. Due to associated back pain with this illness you will need a face to face visit to have a urinalysis and urine culture done for proper treatment.   NOTE: If you entered your credit card information for this eVisit, you will not be charged. You may see a "hold" on your card for the $30 but that hold will drop off and you will not have a charge processed.  If you are having a true medical emergency please call 911.  If you need an urgent face to face visit, [place] has four urgent care centers for your convenience.  If you need care fast and have a high deductible or no insurance consider:   [email or web address] to reserve your spot online an avoid wait times  [facility] [address], [address] [place], [place] [address] 8 am to 8 pm Monday-Friday 10 am to 4 pm Saturday-Sunday *Across the street from [employer]  [place], [address] 8 am to 5 pm Monday-Friday * In the [facility] on the [facility]   The following sites will take your  insurance:  . [facility] [place] a Provider at this Location  [address] [place], [place] [phone] . 10 am to 8 pm Monday-Friday . 12 pm to 8 pm Saturday-Sunday   . [facility] at [place] a Provider at this Location  [address], [place], [place] [address] . 8 am to 8 pm Monday-Friday . 9 am to 6 pm Saturday . 11 am to 6 pm Sunday   . [facility] at [place] Get Driving Directions  [address] [place], [place] [address] . 8 am to 8 pm Monday-Friday . 8 am to 4 pm Saturday-Sunday   Your e-visit answers were reviewed by a  board certified advanced clinical practitioner to complete your personal care plan.

## 2019-01-18 ENCOUNTER — Telehealth: Payer: No Typology Code available for payment source | Admitting: Physician Assistant

## 2019-01-18 DIAGNOSIS — B9689 Other specified bacterial agents as the cause of diseases classified elsewhere: Secondary | ICD-10-CM

## 2019-01-18 DIAGNOSIS — N760 Acute vaginitis: Secondary | ICD-10-CM | POA: Diagnosis not present

## 2019-01-18 MED ORDER — CLINDAMYCIN PHOSPHATE 2 % VA CREA: 1.0000 | TOPICAL_CREAM | Freq: Every day | VAGINAL | 0 refills | Status: DC

## 2019-01-18 MED ORDER — METRONIDAZOLE 500 MG PO TABS: 500.0000 mg | ORAL_TABLET | Freq: Two times a day (BID) | ORAL | 0 refills | Status: DC

## 2019-01-18 NOTE — Addendum Note (Signed)
Addended by: Brunetta Jeans on: 01/18/2019 04:35 PM   Modules accepted: Orders

## 2019-01-18 NOTE — Progress Notes (Signed)
I have spent 5 minutes in review of e-visit questionnaire, review and updating patient chart, medical decision making and response to patient.   Lido Maske Cody Josselin Gaulin, PA-C    

## 2019-01-18 NOTE — Progress Notes (Signed)

## 2019-02-26 ENCOUNTER — Ambulatory Visit: Payer: No Typology Code available for payment source | Admitting: Family Medicine

## 2019-03-13 ENCOUNTER — Ambulatory Visit: Payer: No Typology Code available for payment source | Admitting: Family Medicine

## 2019-03-13 ENCOUNTER — Encounter: Payer: Self-pay | Admitting: Family Medicine

## 2019-05-17 ENCOUNTER — Other Ambulatory Visit: Payer: No Typology Code available for payment source

## 2019-08-13 ENCOUNTER — Ambulatory Visit: Payer: No Typology Code available for payment source | Attending: Internal Medicine

## 2019-09-13 ENCOUNTER — Encounter (HOSPITAL_COMMUNITY): Payer: Self-pay | Admitting: Emergency Medicine

## 2019-09-13 ENCOUNTER — Emergency Department (HOSPITAL_COMMUNITY): Payer: HRSA Program

## 2019-09-13 ENCOUNTER — Inpatient Hospital Stay (HOSPITAL_COMMUNITY)
Admission: EM | Admit: 2019-09-13 | Discharge: 2019-09-19 | DRG: 177 | Disposition: A | Discharge status: Home / Self Care | Payer: HRSA Program | Attending: Internal Medicine | Admitting: Internal Medicine

## 2019-09-13 ENCOUNTER — Other Ambulatory Visit: Payer: Self-pay

## 2019-09-13 DIAGNOSIS — J45901 Unspecified asthma with (acute) exacerbation: Secondary | ICD-10-CM | POA: Diagnosis present

## 2019-09-13 DIAGNOSIS — Z79899 Other long term (current) drug therapy: Secondary | ICD-10-CM

## 2019-09-13 DIAGNOSIS — I514 Myocarditis, unspecified: Secondary | ICD-10-CM | POA: Diagnosis present

## 2019-09-13 DIAGNOSIS — Z823 Family history of stroke: Secondary | ICD-10-CM

## 2019-09-13 DIAGNOSIS — U071 COVID-19: Principal | ICD-10-CM | POA: Diagnosis present

## 2019-09-13 DIAGNOSIS — Z791 Long term (current) use of non-steroidal anti-inflammatories (NSAID): Secondary | ICD-10-CM

## 2019-09-13 DIAGNOSIS — R0602 Shortness of breath: Secondary | ICD-10-CM | POA: Diagnosis not present

## 2019-09-13 DIAGNOSIS — Z820 Family history of epilepsy and other diseases of the nervous system: Secondary | ICD-10-CM

## 2019-09-13 DIAGNOSIS — I214 Non-ST elevation (NSTEMI) myocardial infarction: Secondary | ICD-10-CM | POA: Diagnosis present

## 2019-09-13 DIAGNOSIS — J45909 Unspecified asthma, uncomplicated: Secondary | ICD-10-CM | POA: Diagnosis present

## 2019-09-13 DIAGNOSIS — I249 Acute ischemic heart disease, unspecified: Secondary | ICD-10-CM | POA: Diagnosis present

## 2019-09-13 DIAGNOSIS — R05 Cough: Secondary | ICD-10-CM

## 2019-09-13 DIAGNOSIS — Z720 Tobacco use: Secondary | ICD-10-CM | POA: Diagnosis present

## 2019-09-13 DIAGNOSIS — F17210 Nicotine dependence, cigarettes, uncomplicated: Secondary | ICD-10-CM | POA: Diagnosis present

## 2019-09-13 DIAGNOSIS — K5909 Other constipation: Secondary | ICD-10-CM | POA: Diagnosis present

## 2019-09-13 DIAGNOSIS — Z83438 Family history of other disorder of lipoprotein metabolism and other lipidemia: Secondary | ICD-10-CM

## 2019-09-13 DIAGNOSIS — Z8249 Family history of ischemic heart disease and other diseases of the circulatory system: Secondary | ICD-10-CM

## 2019-09-13 DIAGNOSIS — R059 Cough, unspecified: Secondary | ICD-10-CM

## 2019-09-13 DIAGNOSIS — E876 Hypokalemia: Secondary | ICD-10-CM | POA: Diagnosis present

## 2019-09-13 DIAGNOSIS — R778 Other specified abnormalities of plasma proteins: Secondary | ICD-10-CM | POA: Diagnosis present

## 2019-09-13 DIAGNOSIS — I319 Disease of pericardium, unspecified: Secondary | ICD-10-CM | POA: Diagnosis present

## 2019-09-13 DIAGNOSIS — R7989 Other specified abnormal findings of blood chemistry: Secondary | ICD-10-CM | POA: Diagnosis present

## 2019-09-13 HISTORY — DX: Tobacco use: Z72.0

## 2019-09-13 LAB — BASIC METABOLIC PANEL
Anion gap: 10 (ref 5–15)
BUN: 8 mg/dL (ref 6–20)
CO2: 22 mmol/L (ref 22–32)
Calcium: 8.1 mg/dL — ABNORMAL LOW (ref 8.9–10.3)
Chloride: 102 mmol/L (ref 98–111)
Creatinine, Ser: 0.7 mg/dL (ref 0.44–1.00)
GFR calc Af Amer: >60 mL/min (ref >60)
GFR calc non Af Amer: >60 mL/min (ref >60)
Glucose, Bld: 91 mg/dL (ref 70–99)
Potassium: 3.4 mmol/L — ABNORMAL LOW (ref 3.5–5.1)
Sodium: 134 mmol/L — ABNORMAL LOW (ref 135–145)

## 2019-09-13 LAB — CBC WITH DIFFERENTIAL/PLATELET
Abs Immature Granulocytes: 0.01 10*3/uL (ref 0.00–0.07)
Basophils Absolute: 0 10*3/uL (ref 0.0–0.1)
Basophils Relative: 0 %
Eosinophils Absolute: 0.2 10*3/uL (ref 0.0–0.5)
Eosinophils Relative: 3 %
HCT: 43.7 % (ref 36.0–46.0)
Hemoglobin: 13.7 g/dL (ref 12.0–15.0)
Immature Granulocytes: 0 %
Lymphocytes Relative: 22 %
Lymphs Abs: 1.6 10*3/uL (ref 0.7–4.0)
MCH: 26.8 pg (ref 26.0–34.0)
MCHC: 31.4 g/dL (ref 30.0–36.0)
MCV: 85.4 fL (ref 80.0–100.0)
Monocytes Absolute: 0.6 10*3/uL (ref 0.1–1.0)
Monocytes Relative: 9 %
Neutro Abs: 5 10*3/uL (ref 1.7–7.7)
Neutrophils Relative %: 66 %
Platelets: 288 10*3/uL (ref 150–400)
RBC: 5.12 MIL/uL — ABNORMAL HIGH (ref 3.87–5.11)
RDW: 15.4 % (ref 11.5–15.5)
WBC: 7.6 10*3/uL (ref 4.0–10.5)
nRBC: 0 % (ref 0.0–0.2)

## 2019-09-13 LAB — TROPONIN I (HIGH SENSITIVITY): Troponin I (High Sensitivity): 194 ng/L (ref <18)

## 2019-09-13 LAB — I-STAT BETA HCG BLOOD, ED (MC, WL, AP ONLY): I-stat hCG, quantitative: <5 m[IU]/mL (ref <5)

## 2019-09-13 MED ORDER — SODIUM CHLORIDE 0.9 % IV BOLUS
1000.0000 mL | Freq: Once | INTRAVENOUS | Status: AC
  Administered 2019-09-13: 1000 mL via INTRAVENOUS

## 2019-09-13 MED ORDER — IOHEXOL 350 MG/ML SOLN
100.0000 mL | Freq: Once | INTRAVENOUS | Status: AC | PRN
  Administered 2019-09-13: 100 mL via INTRAVENOUS

## 2019-09-13 MED ORDER — POTASSIUM CHLORIDE CRYS ER 20 MEQ PO TBCR
40.0000 meq | EXTENDED_RELEASE_TABLET | Freq: Once | ORAL | Status: DC
  Filled 2019-09-13 (×2): qty 2

## 2019-09-13 MED ORDER — SODIUM CHLORIDE 0.9 % IV BOLUS
1000.0000 mL | Freq: Once | INTRAVENOUS | Status: AC
  Administered 2019-09-14: 1000 mL via INTRAVENOUS

## 2019-09-13 NOTE — ED Provider Notes (Signed)
[facility] EMERGENCY DEPARTMENT Provider Note   CSN: [id number] Arrival date & time: 09/13/19  1505     History Chief Complaint  Patient presents with  . Cough    Madeline Davis is a 29 y.o. female who presents with shortness of breath and palpitations.  Patient states that symptoms have been going on approximately 1 week.  Was brought her in today as she gets extremely short of breath with minimal exertion and feels like her heart is pounding.  She reports associated chills, body aches, nasal congestion, slight cough.  Cough is not productive but she feels like she does have chest congestion.  She has facial pain and pain around her eyes.  She has not had an appetite and has not been eating and drinking very much.  She denies wheezing but she has tried her inhaler for shortness of breath that has not helped at all.  She denies fever, headache, anosmia, chest pain, hemoptysis, abdominal pain, nausea, vomiting, urinary symptoms. No recent surgery/travel/immobilization, hx of cancer, leg swelling, hemoptysis, prior DVT/PE, or hormone use.  HPI     Past Medical History:  Diagnosis Date  . Asthma   . Constipation 08/12/2014  . Vitamin D deficiency 08/30/2016    Patient Active Problem List   Diagnosis Date Noted  . Vitamin D deficiency 08/30/2016  . Body aches 08/25/2016  . Fatigue 08/25/2016  . Constipation 08/12/2014    History reviewed. No pertinent surgical history.   OB History    Gravida  1   Para      Term      Preterm      AB  1   Living  0     SAB  1   TAB      Ectopic      Multiple      Live Births              Family History  Problem Relation Age of Onset  . Hypertension Mother   . Hypertension Father   . Hyperlipidemia Father   . Stroke Father   . Alzheimer's disease Maternal Grandmother   . Alzheimer's disease Maternal Grandfather     Social History   Tobacco Use  . Smoking status: Current Every Day Smoker    Packs/day: 0.25     Years: 8.00    Pack years: 2.00    Types: Cigarettes  . Smokeless tobacco: Never Used  Substance Use Topics  . Alcohol use: Yes    Comment: occasionally  . Drug use: No    Types: Marijuana    Comment: not since september of 2012    Home Medications Prior to Admission medications   Medication Sig Start Date End Date Taking? Authorizing Provider  albuterol (PROVENTIL) (2.5 MG/3ML) 0.083% nebulizer solution Take 3 mLs (2.5 mg total) by nebulization every 6 (six) hours as needed for wheezing or shortness of breath. 09/28/18  Yes Brunetta Jeans, PA-C  albuterol (VENTOLIN HFA) 108 (90 Base) MCG/ACT inhaler Inhale 1-2 puffs into the lungs every 6 (six) hours as needed for wheezing or shortness of breath.   Yes [provider]  ibuprofen (ADVIL,MOTRIN) 800 MG tablet TK 1 T PO Q 6 TO 8 H PRN 07/12/18   [provider]  metroNIDAZOLE (FLAGYL) 500 MG tablet Take 1 tablet (500 mg total) by mouth 2 (two) times daily. Patient not taking: Reported on 09/13/2019 01/18/19   Brunetta Jeans, PA-C    Allergies    Patient has  no known allergies.  Review of Systems   Review of Systems  Constitutional: Positive for activity change, appetite change and chills. Negative for fever.  HENT: Positive for congestion, sinus pressure and sinus pain. Negative for sore throat.   Respiratory: Positive for cough and shortness of breath. Negative for wheezing.   Cardiovascular: Positive for palpitations. Negative for chest pain and leg swelling.  Gastrointestinal: Positive for diarrhea. Negative for abdominal pain, nausea and vomiting.  Genitourinary: Negative for dysuria and flank pain.  All other systems reviewed and are negative.   Physical Exam Updated Vital Signs BP 114/77 (BP Location: Right Arm)   Pulse (!) 125   Temp 98.6 F (37 C) (Oral)   Resp 18   Ht 5' (1.524 m)   Wt 53.1 kg   LMP 09/06/2019   SpO2 99%   BMI 22.85 kg/m   Physical Exam Vitals and nursing note reviewed.   Constitutional:      General: She is not in acute distress.    Appearance: Normal appearance. She is well-developed. She is not ill-appearing.  HENT:     Head: Normocephalic and atraumatic.     Nose: Congestion present.  Eyes:     General: No scleral icterus.       Right eye: No discharge.        Left eye: No discharge.     Conjunctiva/sclera: Conjunctivae normal.     Pupils: Pupils are equal, round, and reactive to light.  Cardiovascular:     Rate and Rhythm: Tachycardia present.  Pulmonary:     Effort: Pulmonary effort is normal. No respiratory distress.     Breath sounds: Normal breath sounds.  Abdominal:     General: There is no distension.     Palpations: Abdomen is soft.     Tenderness: There is no abdominal tenderness.  Musculoskeletal:     Cervical back: Normal range of motion.     Right lower leg: No edema.     Left lower leg: No edema.  Skin:    General: Skin is warm and dry.  Neurological:     Mental Status: She is alert and oriented to person, place, and time.  Psychiatric:        Behavior: Behavior normal.     ED Results / Procedures / Treatments   Labs (all labs ordered are listed, but only abnormal results are displayed) Labs Reviewed  BASIC METABOLIC PANEL - Abnormal; Notable for the following components:      Result Value   Sodium 134 (*)    Potassium 3.4 (*)    Calcium 8.1 (*)    All other components within normal limits  CBC WITH DIFFERENTIAL/PLATELET - Abnormal; Notable for the following components:   RBC 5.12 (*)    All other components within normal limits  TROPONIN I (HIGH SENSITIVITY) - Abnormal; Notable for the following components:   Troponin I (High Sensitivity) 194 (*)    All other components within normal limits  SARS CORONAVIRUS 2 (TAT 6-24 HRS)  I-STAT BETA HCG BLOOD, ED ([facility], [facility], [facility] ONLY)    EKG EKG Interpretation  Date/Time:  Thursday September 13 2019 15:50:06 EDT Ventricular Rate:  130 PR Interval:  116 QRS Duration: 56 QT  Interval:  292 QTC Calculation: 429 R Axis:   80 Text Interpretation: Sinus tachycardia T wave abnormality, consider inferior ischemia Abnormal ECG Since last tracing Rate faster Nonspecific T wave abnormality NOW PRESENT Confirmed by Susy Frizzle [phone]) on 09/13/2019 3:58:16 PM  Radiology DG Chest Port 1 View  Result Date: 09/13/2019 CLINICAL DATA:  Shortness of breath, dyspnea EXAM: PORTABLE CHEST 1 VIEW COMPARISON:  08/03/2015 FINDINGS: The heart size and mediastinal contours are within normal limits. Both lungs are clear. The visualized skeletal structures are unremarkable. IMPRESSION: No acute abnormality of the lungs in AP portable projection. Electronically Signed   By: Lauralyn Primes M.D.   On: 09/13/2019 19:10    Procedures Procedures (including critical care time)  Medications Ordered in ED Medications  potassium chloride SA (KLOR-CON) CR tablet 40 mEq (has no administration in time range)  sodium chloride 0.9 % bolus 1,000 mL (0 mLs Intravenous Stopped 09/13/19 2100)    ED Course  I have reviewed the triage vital signs and the nursing notes.  Pertinent labs & imaging results that were available during my care of the patient were reviewed by me and considered in my medical decision making (see chart for details).  29 year old female presents with chills, body aches, URI symptoms, shortness of breath and palpitations.  She is significantly tachycardic in triage but otherwise vital signs are normal.  On my exam she has symptoms consistent with a sinusitis.  Heart rate is fast and regular.  Lungs are clear to auscultation.  Abdomen is soft and nontender.  There is no lower extremity edema.  EKG is sinus tachycardia with T wave abnormalities.  Will obtain labs, Covid swab, chest x-ray.  Will give 1 L of fluids.  CBC is reassuring.  BMP is remarkable for mild hypokalemia and hypocalcemia.  8:34 PM Rechecked pt. She states she still feels "like crap" HR is improved and now in the  90s. Due to unexplained SOB with tachycardia will obtain CTA to r/o PE.  We will add on troponin  Patient was able to ambulate without hypoxia but was symptomatic and tachycardic.  Troponin is minimally elevated to 194.  At shift change CT angio of the chest to rule out PE is pending.  With elevated troponin and symptoms there is concern for myocarditis.  Plan is for admission to the hospital for further management.  Care signed out to Devonne Doughty.  MDM Rules/Calculators/A&P                       Final Clinical Impression(s) / ED Diagnoses Final diagnoses:  Shortness of breath    Rx / DC Orders ED Discharge Orders    None       Bethel Born, PA-C 09/13/19 2212    Pollyann Savoy, MD 09/14/19 1027

## 2019-09-13 NOTE — ED Notes (Signed)
Date and time results received: 09/13/19 2135 (use smartphrase ".now" to insert current time)  Test: Troponin Critical Value: 194  Name of Provider Notified:Sheldon  Orders Received? Or Actions Taken?:

## 2019-09-13 NOTE — ED Notes (Signed)
Ambulated in room with pulse ox. O2 sat remained 100% however pt reported she felt short of breath and pulse was around 115.

## 2019-09-13 NOTE — ED Provider Notes (Signed)
Received patient as a handoff at shift change from Madeline Hora, PA-C.  HPI as obtained by handoff provider: [name] is a 29 y.o. female who presents with shortness of breath and palpitations.  Patient states that symptoms have been going on approximately 1 week.  Was brought her in today as she gets extremely short of breath with minimal exertion and feels like her heart is pounding.  She reports associated chills, body aches, nasal congestion, slight cough.  Cough is not productive but she feels like she does have chest congestion.  She has facial pain and pain around her eyes.  She has not had an appetite and has not been eating and drinking very much.  She denies wheezing but she has tried her inhaler for shortness of breath that has not helped at all.  She denies fever, headache, anosmia, chest pain, hemoptysis, abdominal pain, nausea, vomiting, urinary symptoms. No recent surgery/travel/immobilization, hx of cancer, leg swelling, hemoptysis, prior DVT/PE, or hormone use.  Physical Exam  BP 100/65   Pulse 97   Temp 98.8 F (37.1 C) (Oral)   Resp 18   Ht 5' (1.524 m)   Wt 53.1 kg   LMP 09/06/2019   SpO2 98%   BMI 22.85 kg/m   Physical Exam Vitals and nursing note reviewed. Exam conducted with a chaperone present.  HENT:     Head: Normocephalic and atraumatic.  Eyes:     General: No scleral icterus.    Conjunctiva/sclera: Conjunctivae normal.  Cardiovascular:     Rate and Rhythm: Regular rhythm. Tachycardia present.     Pulses: Normal pulses.     Heart sounds: Normal heart sounds.  Pulmonary:     Effort: Pulmonary effort is normal. No respiratory distress.     Breath sounds: Normal breath sounds. No wheezing or rales.  Musculoskeletal:     Cervical back: Normal range of motion. No rigidity.     Right lower leg: No edema.     Left lower leg: No edema.  Skin:    General: Skin is dry.     Capillary Refill: Capillary refill takes less than 2 seconds.  Neurological:      Mental Status: She is alert and oriented to person, place, and time.     GCS: GCS eye subscore is 4. GCS verbal subscore is 5. GCS motor subscore is 6.  Psychiatric:        Mood and Affect: Mood normal.        Behavior: Behavior normal.        Thought Content: Thought content normal.     ED Course/Procedures   Clinical Course as of Sep 13 33  Fri Sep 14, 2019  0034 Troponin increased from 194 to 367.   Troponin I (High Sensitivity)(!!) [GG]    Clinical Course User Index [GG] Corena Herter, PA-C    Procedures Results for orders placed or performed during the hospital encounter of 24/23/53  Basic metabolic panel  Result Value Ref Range   Sodium 134 (L) 135 - 145 mmol/L   Potassium 3.4 (L) 3.5 - 5.1 mmol/L   Chloride 102 98 - 111 mmol/L   CO2 22 22 - 32 mmol/L   Glucose, Bld 91 70 - 99 mg/dL   BUN 8 6 - 20 mg/dL   Creatinine, Ser 0.70 0.44 - 1.00 mg/dL   Calcium 8.1 (L) 8.9 - 10.3 mg/dL   GFR calc non Af Amer >60 >60 mL/min   GFR calc Af Amer >60 >60  mL/min   Anion gap 10 5 - 15  CBC with Differential  Result Value Ref Range   WBC 7.6 4.0 - 10.5 K/uL   RBC 5.12 (H) 3.87 - 5.11 MIL/uL   Hemoglobin 13.7 12.0 - 15.0 g/dL   HCT 56.4 33.2 - 95.1 %   MCV 85.4 80.0 - 100.0 fL   MCH 26.8 26.0 - 34.0 pg   MCHC 31.4 30.0 - 36.0 g/dL   RDW 88.4 16.6 - 06.3 %   Platelets 288 150 - 400 K/uL   nRBC 0.0 0.0 - 0.2 %   Neutrophils Relative % 66 %   Neutro Abs 5.0 1.7 - 7.7 K/uL   Lymphocytes Relative 22 %   Lymphs Abs 1.6 0.7 - 4.0 K/uL   Monocytes Relative 9 %   Monocytes Absolute 0.6 0.1 - 1.0 K/uL   Eosinophils Relative 3 %   Eosinophils Absolute 0.2 0.0 - 0.5 K/uL   Basophils Relative 0 %   Basophils Absolute 0.0 0.0 - 0.1 K/uL   Immature Granulocytes 0 %   Abs Immature Granulocytes 0.01 0.00 - 0.07 K/uL  I-Stat beta hCG blood, ED  Result Value Ref Range   I-stat hCG, quantitative <5.0 <5 mIU/mL   Comment 3          Troponin I (High Sensitivity)  Result Value Ref  Range   Troponin I (High Sensitivity) 194 (HH) <18 ng/L  Troponin I (High Sensitivity)  Result Value Ref Range   Troponin I (High Sensitivity) 367 (HH) <18 ng/L   CT Angio Chest PE W/Cm &/Or Wo Cm  Result Date: 09/13/2019 CLINICAL DATA:  Shortness of breath EXAM: CT ANGIOGRAPHY CHEST WITH CONTRAST TECHNIQUE: Multidetector CT imaging of the chest was performed using the standard protocol during bolus administration of intravenous contrast. Multiplanar CT image reconstructions and MIPs were obtained to evaluate the vascular anatomy. CONTRAST:  OMNIPAQUE IOHEXOL 350 MG/ML SOLN COMPARISON:  Radiograph 09/13/2019 FINDINGS: Cardiovascular: Satisfactory opacification the pulmonary arteries to the segmental level. No pulmonary artery filling defects are identified. Central pulmonary arteries are normal caliber. Normal heart size. No pericardial effusion. The aorta is normal caliber. Normal 3 vessel branching of the aortic arch. Proximal great vessels are unremarkable. 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 No mediastinal fluid or gas. Normal thyroid gland and thoracic inlet. No acute abnormality of the trachea or esophagus. No worrisome mediastinal, hilar or axillary adenopathy. Lungs/Pleura: No consolidation, features of edema, pneumothorax, or effusion. No suspicious pulmonary nodules or masses. The Upper Abdomen: No acute abnormalities present in the visualized portions of the upper abdomen. Musculoskeletal: No acute osseous abnormality or suspicious osseous lesion. Bilateral metallic nipple ornamentation. No worrisome chest wall lesions or mass. Review of the MIP images confirms the above findings. IMPRESSION: 1. No evidence of pulmonary embolism or other acute cardiopulmonary process. 2. Wedge-shaped soft tissue attenuation in the anterior mediastinum is most  likely reflective of thymic remnant in a patient of this age given location and configuration. Electronically Signed   By: Kreg Shropshire M.D.   On: 09/13/2019 22:44   DG Chest Port 1 View  Result Date: 09/13/2019 CLINICAL DATA:  Shortness of breath, dyspnea EXAM: PORTABLE CHEST 1 VIEW COMPARISON:  08/03/2015 FINDINGS: The heart size and mediastinal contours are within normal limits. Both lungs are clear. The visualized skeletal structures are unremarkable. IMPRESSION: No acute abnormality of the lungs in  AP portable projection. Electronically Signed   By: Lauralyn Primes M.D.   On: 09/13/2019 19:10     MDM   Plan at time of handoff according to handoff provider:  29 year old female presents with chills, body aches, URI symptoms, shortness of breath and palpitations.  She is significantly tachycardic in triage but otherwise vital signs are normal.  On my exam she has symptoms consistent with a sinusitis.  Heart rate is fast and regular.  Lungs are clear to auscultation.  Abdomen is soft and nontender.  There is no lower extremity edema.  EKG is sinus tachycardia with T wave abnormalities.  Will obtain labs, Covid swab, chest x-ray.  Will give 1 L of fluids. CBC is reassuring.  BMP is remarkable for mild hypokalemia and hypocalcemia. 8:34 PM Rechecked pt. She states she still feels "like crap" HR is improved and now in the 90s. Due to unexplained SOB with tachycardia will obtain CTA to r/o PE.  We will add on troponin Patient was able to ambulate without hypoxia but was symptomatic and tachycardic.  Troponin is minimally elevated to 194. At shift change CT angio of the chest to rule out PE is pending.  With elevated troponin and symptoms there is concern for myocarditis.  Plan is for admission to the hospital for further management.  Care signed out to [employer].  On my examination, patient informs me that her shortness of breath and chest discomfort symptoms began yesterday.  PMH significant for  asthma, but no wheezing auscultated.  She states that this feels entirely different from her prior asthma exacerbations.  She states that the chest pain that is central and occasionally will radiate to her back has been constant.  Worsened with exertion. She also endorses a 7-day history of upper respiratory infection symptoms in addition to diminished appetite and intermittent loose nonbloody stools.  6 to 24-hour COVID-19 testing is still pending.  Given her elevated troponin to 194, patient is to be admitted to hospitalist services.  I reviewed patient's medical record and she was evaluated for chest pain 2017 and found to have mildly elevated troponin at that time, as well.  She was deemed low risk for PE, pneumothorax, acute coronary syndrome, or acute aortic dissection and was subsequently discharged home with plans for outpatient follow-up.  CTA chest was obtained, reviewed, and there is no obvious PE.  I have added a rapid urine drug screen to assess for cocaine induced vasospasm causing patient's elevated troponin, shortness of breath, and chest discomfort.  She adamantly denies any illicit drug use.  No anemia to suggest demand ischemia.  Provided an additional bolus of normal saline given her reports of diminished appetite and persistently elevated heart rate.  Repeat EKG was obtained and appears less concerning than initial EKG.  However, patient tells me that she becomes acutely short of breath with worsening chest discomfort with any movement or exertion.  Collecting second troponin.  Also adding a BNP.  No peripheral edema on exam.   Spoke with Dr. Robb Matar who would like to wait for second troponin before determining disposition.  Repeat troponin came back elevated to 367, up from 194 and labs obtained 4 hours prior.  Repeat EKGs appeared improved from initial tracing on arrival.    Patient accepted by Dr. Robb Matar.     Lorelee New, PA-C 09/14/19 0044    Pollyann Savoy, MD 09/14/19  1028

## 2019-09-13 NOTE — ED Triage Notes (Signed)
Pt reports runny nose/eyes several weeks ago. Pt reports cough, shortness of breath started a few days ago. Pt reports used inhaler at home with minimal relief. Pt denies dyspnea at rest and reports generalized body aches, chills x2 days ago. Pt reports is scheduled to have 1st covid vaccine this Saturday.

## 2019-09-14 ENCOUNTER — Observation Stay (HOSPITAL_COMMUNITY): Payer: HRSA Program

## 2019-09-14 ENCOUNTER — Other Ambulatory Visit: Payer: Self-pay

## 2019-09-14 ENCOUNTER — Encounter (HOSPITAL_COMMUNITY): Payer: Self-pay | Admitting: Internal Medicine

## 2019-09-14 DIAGNOSIS — E876 Hypokalemia: Secondary | ICD-10-CM | POA: Diagnosis present

## 2019-09-14 DIAGNOSIS — B349 Viral infection, unspecified: Secondary | ICD-10-CM | POA: Insufficient documentation

## 2019-09-14 DIAGNOSIS — J45901 Unspecified asthma with (acute) exacerbation: Secondary | ICD-10-CM | POA: Diagnosis present

## 2019-09-14 DIAGNOSIS — R0602 Shortness of breath: Secondary | ICD-10-CM

## 2019-09-14 DIAGNOSIS — J45909 Unspecified asthma, uncomplicated: Secondary | ICD-10-CM | POA: Diagnosis present

## 2019-09-14 DIAGNOSIS — U071 COVID-19: Secondary | ICD-10-CM | POA: Diagnosis not present

## 2019-09-14 DIAGNOSIS — Z720 Tobacco use: Secondary | ICD-10-CM | POA: Diagnosis present

## 2019-09-14 DIAGNOSIS — R778 Other specified abnormalities of plasma proteins: Secondary | ICD-10-CM | POA: Diagnosis present

## 2019-09-14 LAB — ECHOCARDIOGRAM COMPLETE
Height: 60 in
Weight: 1872 oz

## 2019-09-14 LAB — URINALYSIS, ROUTINE W REFLEX MICROSCOPIC
Bilirubin Urine: NEGATIVE
Glucose, UA: NEGATIVE mg/dL
Hgb urine dipstick: NEGATIVE
Ketones, ur: 5 mg/dL — AB
Leukocytes,Ua: NEGATIVE
Nitrite: NEGATIVE
Protein, ur: NEGATIVE mg/dL
Specific Gravity, Urine: >1.046 — ABNORMAL HIGH (ref 1.005–1.030)
pH: 6 (ref 5.0–8.0)

## 2019-09-14 LAB — RESPIRATORY PANEL BY RT PCR (FLU A&B, COVID)
Influenza A by PCR: NEGATIVE
Influenza B by PCR: NEGATIVE
SARS Coronavirus 2 by RT PCR: POSITIVE — AB

## 2019-09-14 LAB — RAPID URINE DRUG SCREEN, HOSP PERFORMED
Amphetamines: NOT DETECTED
Barbiturates: NOT DETECTED
Benzodiazepines: NOT DETECTED
Cocaine: NOT DETECTED
Opiates: NOT DETECTED
Tetrahydrocannabinol: NOT DETECTED

## 2019-09-14 LAB — PHOSPHORUS: Phosphorus: 2.9 mg/dL (ref 2.5–4.6)

## 2019-09-14 LAB — FIBRINOGEN: Fibrinogen: 297 mg/dL (ref 210–475)

## 2019-09-14 LAB — D-DIMER, QUANTITATIVE: D-Dimer, Quant: <0.27 ug/mL-FEU (ref 0.00–0.50)

## 2019-09-14 LAB — ABO/RH: ABO/RH(D): AB POS

## 2019-09-14 LAB — TROPONIN I (HIGH SENSITIVITY)
Troponin I (High Sensitivity): 367 ng/L (ref <18)
Troponin I (High Sensitivity): 570 ng/L (ref <18)
Troponin I (High Sensitivity): 688 ng/L (ref <18)

## 2019-09-14 LAB — BRAIN NATRIURETIC PEPTIDE: B Natriuretic Peptide: 43 pg/mL (ref 0.0–100.0)

## 2019-09-14 LAB — MAGNESIUM: Magnesium: 1.8 mg/dL (ref 1.7–2.4)

## 2019-09-14 LAB — C-REACTIVE PROTEIN: CRP: 0.6 mg/dL (ref <1.0)

## 2019-09-14 LAB — FERRITIN: Ferritin: 10 ng/mL — ABNORMAL LOW (ref 11–307)

## 2019-09-14 MED ORDER — DEXAMETHASONE SODIUM PHOSPHATE 4 MG/ML IJ SOLN
4.0000 mg | Freq: Once | INTRAMUSCULAR | Status: AC
  Administered 2019-09-14: 4 mg via INTRAVENOUS
  Filled 2019-09-14: qty 1

## 2019-09-14 MED ORDER — HYDROCOD POLST-CPM POLST ER 10-8 MG/5ML PO SUER: 5.0000 mL | Freq: Two times a day (BID) | ORAL | Status: DC | PRN

## 2019-09-14 MED ORDER — IPRATROPIUM-ALBUTEROL 20-100 MCG/ACT IN AERS: 4.0000 | INHALATION_SPRAY | Freq: Once | RESPIRATORY_TRACT | Status: DC

## 2019-09-14 MED ORDER — POTASSIUM CHLORIDE CRYS ER 20 MEQ PO TBCR
40.0000 meq | EXTENDED_RELEASE_TABLET | ORAL | Status: AC
  Administered 2019-09-14 – 2019-09-15 (×2): 40 meq via ORAL
  Filled 2019-09-14: qty 2

## 2019-09-14 MED ORDER — ONDANSETRON HCL 4 MG/2ML IJ SOLN: 4.0000 mg | Freq: Four times a day (QID) | INTRAMUSCULAR | Status: DC | PRN

## 2019-09-14 MED ORDER — MAGNESIUM SULFATE 2 GM/50ML IV SOLN
2.0000 g | Freq: Once | INTRAVENOUS | Status: AC
  Administered 2019-09-14: 2 g via INTRAVENOUS
  Filled 2019-09-14: qty 50

## 2019-09-14 MED ORDER — ONDANSETRON HCL 4 MG PO TABS: 4.0000 mg | ORAL_TABLET | Freq: Four times a day (QID) | ORAL | Status: DC | PRN

## 2019-09-14 MED ORDER — GUAIFENESIN-DM 100-10 MG/5ML PO SYRP: 10.0000 mL | ORAL_SOLUTION | ORAL | Status: DC | PRN

## 2019-09-14 MED ORDER — ZINC SULFATE 220 (50 ZN) MG PO CAPS
220.0000 mg | ORAL_CAPSULE | Freq: Every day | ORAL | Status: DC
  Administered 2019-09-14 – 2019-09-19 (×6): 220 mg via ORAL
  Filled 2019-09-14 (×6): qty 1

## 2019-09-14 MED ORDER — ACETAMINOPHEN 650 MG RE SUPP: 650.0000 mg | Freq: Four times a day (QID) | RECTAL | Status: DC | PRN

## 2019-09-14 MED ORDER — IPRATROPIUM-ALBUTEROL 20-100 MCG/ACT IN AERS
2.0000 | INHALATION_SPRAY | Freq: Four times a day (QID) | RESPIRATORY_TRACT | Status: DC | PRN
  Filled 2019-09-14: qty 4

## 2019-09-14 MED ORDER — ENOXAPARIN SODIUM 40 MG/0.4ML ~~LOC~~ SOLN
40.0000 mg | SUBCUTANEOUS | Status: DC
  Administered 2019-09-14: 40 mg via SUBCUTANEOUS
  Filled 2019-09-14: qty 0.4

## 2019-09-14 MED ORDER — ACETAMINOPHEN 325 MG PO TABS
650.0000 mg | ORAL_TABLET | Freq: Four times a day (QID) | ORAL | Status: DC | PRN
  Administered 2019-09-15: 01:00:00 650 mg via ORAL
  Filled 2019-09-14: qty 2

## 2019-09-14 MED ORDER — POTASSIUM CHLORIDE IN NACL 40-0.9 MEQ/L-% IV SOLN
INTRAVENOUS | Status: AC
  Administered 2019-09-14: 88 mL/h via INTRAVENOUS
  Filled 2019-09-14 (×4): qty 1000

## 2019-09-14 MED ORDER — ASCORBIC ACID 500 MG PO TABS
500.0000 mg | ORAL_TABLET | Freq: Every day | ORAL | Status: DC
  Administered 2019-09-14 – 2019-09-19 (×6): 500 mg via ORAL
  Filled 2019-09-14 (×6): qty 1

## 2019-09-14 NOTE — Progress Notes (Signed)
*  PRELIMINARY RESULTS* Echocardiogram 2D Echocardiogram has been performed.  Madeline Davis 09/14/2019, 2:43 PM

## 2019-09-14 NOTE — ED Notes (Signed)
Date and time results received: 09/14/19 1:09 AM  (use smartphrase ".now" to insert current time)  Test: COVID  Critical Value: Positive  Name of Provider Notified: Dr. Robb Matar  Orders Received? Or Actions Taken?: see chart

## 2019-09-14 NOTE — Progress Notes (Signed)
Troponin 688 reported to Dr. Jayme Cloud

## 2019-09-14 NOTE — Progress Notes (Signed)
   I called and discussed echo findings with Dr. Purvis Sheffield --Also discussed patient's persistent borderline elevation of troponin along with chest discomfort, dyspnea palpitations dizziness and tachycardia   Shon Hale, MD

## 2019-09-14 NOTE — Progress Notes (Signed)
  Patient seen and evaluated, chart reviewed, please see EMR for updated orders. Please see full H&P dictated by admitting physician Dr Robb Matar for same date of service.   Brief Summary:- 29 y.o. female with medical history significant of asthma, tobacco abuse, chronic constipation, vitamin D deficiency admitted on 09/14/2019 with chest pains palpitations and dyspnea on exertion.  Found to be COVID-19 positive  --- her chest pain, dizziness, tachycardia, and dyspnea persist and troponin is trending up slowly-----Talked to Dr Arlys John Crenshaw------she needs cardiology eval given persistent symptoms and troponin   Troponin 194 >>>> 367>>>570>>>>688  = BNP is 43  Discussed with Dr. Arlys John Crenshaw-----Pt will transfer to [facility] for official cardiology consult.   - EF of 60 to 65%, no regional wall motion abnormalities, diastolic parameters are normal, no pericardial effusion noted  -EKG nonacute -CTA chest without acute findings, specifically no pulmonary embolism or pneumonia   A/p 1) chest pain plus dyspnea plus tachycardia plus dizziness--- with elevated troponin as above, -Discussed with Dr. Arlys John Crenshaw-----Pt will transfer to [facility] for official cardiology consult.    2)Covid positive status----no hypoxia, no pneumonia, fibrinogen and D-dimer are not elevated, ferritin is not elevated -CRP is not elevated (0.6)----patient does not meet criteria for steroids or remdesivir  3) tobacco abuse--- smoking cessation advised  4)Social/Ethics--plan of care discussed with patient and her mother Bonita Quin, questions answered  Shon Hale, MD

## 2019-09-14 NOTE — TOC Initial Note (Signed)
Transition of Care [facility]) - Initial/Assessment Note    Patient Details  Name: Madeline Davis MRN: [id number] Date of Birth: 10/01/1990  [facility] CM/SW Contact:    Ewing Schlein, LCSW Phone Number: 09/14/2019, 1:36 PM  Clinical Narrative: TOC received consult for PCP and possible medication assistance needs. CSW called patient to complete initial assessment. Patient is a 29 year old female who was admitted to the hospital for COVID. Patient is agreeable to referral for [facility]. CSW called [facility] to make referral. New patient patient provided to RN to put in patient's discharge paperwork. TOC to follow.  Expected Discharge Plan: Home/Self Care Barriers to Discharge: Continued Medical Work up  Expected Discharge Plan and Services Expected Discharge Plan: Home/Self Care   Living arrangements for the past 2 months: Single Family Home  Prior Living Arrangements/Services Living arrangements for the past 2 months: Single Family Home   Patient language and need for interpreter reviewed:: Yes Do you feel safe going back to the place where you live?: Yes      Need for Family Participation in Patient Care: No (Comment) Care giver support system in place?: Yes (comment)([name] (mother):)   Criminal Activity/Legal Involvement Pertinent to Current Situation/Hospitalization: No - Comment as needed  Activities of Daily Living Home Assistive Devices/Equipment: None ADL Screening (condition at time of admission) Patient's cognitive ability adequate to safely complete daily activities?: Yes Is the patient deaf or have difficulty hearing?: No Does the patient have difficulty seeing, even when wearing glasses/contacts?: No Does the patient have difficulty concentrating, remembering, or making decisions?: No Patient able to express need for assistance with ADLs?: Yes Does the patient have difficulty dressing or bathing?: No Independently performs ADLs?: Yes (appropriate for  developmental age) Does the patient have difficulty walking or climbing stairs?: No Weakness of Legs: None Weakness of Arms/Hands: None  Permission Sought/Granted    Emotional Assessment   Attitude/Demeanor/Rapport: Engaged Affect (typically observed): Accepting Orientation: : Oriented to Self, Oriented to Place, Oriented to  Time, Oriented to Situation Alcohol / Substance Use: Tobacco Use Psych Involvement: No (comment)  Admission diagnosis:  Shortness of breath [R06.02] Cough [R05] Viral syndrome [B34.9] COVID-19 virus infection [U07.1] Patient Active Problem List   Diagnosis Date Noted  . Viral syndrome 09/14/2019  . Elevated troponin 09/14/2019  . Hypokalemia 09/14/2019  . COVID-19 virus infection 09/14/2019  . Asthma exacerbation 09/14/2019  . Tobacco use 09/14/2019  . Vitamin D deficiency 08/30/2016  . Body aches 08/25/2016  . Fatigue 08/25/2016  . Constipation 08/12/2014   PCP:  Patient, No Pcp Per Pharmacy:   [id number] - [place], [place] - [address] [place] [address] Phone: [phone] Fax: [phone]  Readmission Risk Interventions No flowsheet data found.

## 2019-09-14 NOTE — ED Notes (Signed)
Date and time results received: 09/14/19 5:07 AM  (use smartphrase ".now" to insert current time)  Test: Trop Critical Value: 570  Name of Provider Notified: Dr. Robb Matar   Orders Received? Or Actions Taken?: see chart

## 2019-09-14 NOTE — H&P (Signed)
History and Physical    Madeline Davis KZS:[id number] DOB: [birth year] DOA: 09/13/2019  PCP: Patient, No Pcp Per   Patient coming from: Home.  I have personally briefly reviewed patient's old medical records in [facility]  Chief Complaint: Shortness of breath, cough and runny nose.  HPI: Madeline Davis is a 29 y.o. female with medical history significant of asthma, chronic constipation, vitamin D deficiency who is coming to the emergency department with complaints of progressively worse dyspnea on exertion associated with chest pressure and palpitations, which gets relief with rest for the past few days.  About a week ago, she developed a dry cough, rhinorrhea and sore throat, which the patient initially believed with her seasonal allergies.  She has also have decreased appetite and soft stools.  She denies sick contacts or travel history.  No PND, orthopnea or pitting edema of the lower extremities.  She denies dysuria, frequency hematuria.  No polyuria, polydipsia, polyphagia or blurred vision.  ED Course: Initial vital signs temperature 98.6 F, pulse 125, respiration 18, blood pressure 114/77 mmHg O2 sat 99% on room air.  The patient received 2 L of normal saline bolus in the ED.  CBC shows a white count 7.6, hemoglobin 13.7 g/dL and platelets 732.  Sodium 134, potassium 3.4 mmol/L.  Calcium is 8.1 mg/dL.  The rest of the body of the BMP are within normal limits.  Troponin #1 was 794 and troponin #2 was 367 ng/L.  BNP was 43.0 pg milliliter.  Phosphorus 2.9 magnesium 1.9 mg/dL.  SARS coronavirus PCR was positive.  Influenza a and B by PCR was negative.  Imaging: Chest radiograph did not show any acute abnormalities.  CTA chest did not show any evidence of PE or any other acute cardiopulmonary process.  There is a wedge-shaped soft tissue attenuation in the anterior mediastinum that is likely a thymic remnant.  Review of Systems: As per HPI otherwise all other systems reviewed and are  negative.  Past Medical History:  Diagnosis Date  . Asthma   . Constipation 08/12/2014  . Vitamin D deficiency 08/30/2016    History reviewed. No pertinent surgical history.  Social History  reports that she has been smoking cigarettes. She has a 2.00 pack-year smoking history. She has never used smokeless tobacco. She reports current alcohol use. She reports that she does not use drugs.  No Known Allergies  Family History  Problem Relation Age of Onset  . Hypertension Mother   . Hypertension Father   . Hyperlipidemia Father   . Stroke Father   . Alzheimer's disease Maternal Grandmother   . Alzheimer's disease Maternal Grandfather    Prior to Admission medications   Medication Sig Start Date End Date Taking? Authorizing Provider  albuterol (PROVENTIL) (2.5 MG/3ML) 0.083% nebulizer solution Take 3 mLs (2.5 mg total) by nebulization every 6 (six) hours as needed for wheezing or shortness of breath. 09/28/18  Yes Waldon Merl, PA-C  albuterol (VENTOLIN HFA) 108 (90 Base) MCG/ACT inhaler Inhale 1-2 puffs into the lungs every 6 (six) hours as needed for wheezing or shortness of breath.   Yes [provider]  ibuprofen (ADVIL,MOTRIN) 800 MG tablet TK 1 T PO Q 6 TO 8 H PRN 07/12/18   [provider]    Physical Exam: Vitals:   09/13/19 1955 09/13/19 1956 09/13/19 2245 09/14/19 0000  BP: 109/78   100/65  Pulse: 100  91 97  Resp:   14 18  Temp:  98.8 F (37.1 C)  TempSrc:  Oral    SpO2: 99%  100% 98%  Weight:      Height:        Constitutional: NAD, calm, comfortable Eyes: PERRL, lids and conjunctivae normal ENMT: Mucous membranes are moist. Posterior pharynx clear of any exudate or lesions. Neck: normal, supple, no masses, no thyromegaly Respiratory: Subtle wheezing, no crackles. Normal respiratory effort. No accessory muscle use.  Cardiovascular: Regular rate and rhythm, no murmurs / rubs / gallops. No extremity edema. 2+ pedal pulses. No carotid  bruits.  Abdomen: no tenderness, no masses palpated. No hepatosplenomegaly. Bowel sounds positive.  Musculoskeletal: no clubbing / cyanosis. Good ROM, no contractures. Normal muscle tone.  Skin: no rashes, lesions, ulcers on limited dermatological examination. Neurologic: CN 2-12 grossly intact. Sensation intact, DTR normal. Strength 5/5 in all 4.  Psychiatric: Normal judgment and insight. Alert and oriented x 3. Normal mood.   Labs on Admission: I have personally reviewed following labs and imaging studies  CBC: Recent Labs  Lab 09/13/19 1902  WBC 7.6  NEUTROABS 5.0  HGB 13.7  HCT 43.7  MCV 85.4  PLT 409    Basic Metabolic Panel: Recent Labs  Lab 09/13/19 1902 09/13/19 2344  NA 134*  --   K 3.4*  --   CL 102  --   CO2 22  --   GLUCOSE 91  --   BUN 8  --   CREATININE 0.70  --   CALCIUM 8.1*  --   MG  --  1.8  PHOS  --  2.9    GFR: Estimated Creatinine Clearance: 74.5 mL/min (by C-G formula based on SCr of 0.7 mg/dL).  Liver Function Tests: No results for input(s): AST, ALT, ALKPHOS, BILITOT, PROT, ALBUMIN in the last 168 hours.  Urine analysis:    Component Value Date/Time   COLORURINE STRAW (A) 08/10/2011 0538   APPEARANCEUR CLEAR 08/10/2011 0538   LABSPEC 1.020 11/22/2016 1554   PHURINE 6.0 11/22/2016 1554   GLUCOSEU NEGATIVE 11/22/2016 1554   HGBUR NEGATIVE 11/22/2016 1554   [place] 11/22/2016 1554   KETONESUR NEGATIVE 11/22/2016 1554   PROTEINUR NEGATIVE 11/22/2016 1554   UROBILINOGEN 1.0 11/22/2016 1554   NITRITE NEGATIVE 11/22/2016 1554   LEUKOCYTESUR NEGATIVE 11/22/2016 1554    Radiological Exams on Admission: CT Angio Chest PE W/Cm &/Or Wo Cm  Result Date: 09/13/2019 CLINICAL DATA:  Shortness of breath EXAM: CT ANGIOGRAPHY CHEST WITH CONTRAST TECHNIQUE: Multidetector CT imaging of the chest was performed using the standard protocol during bolus administration of intravenous contrast. Multiplanar CT image reconstructions and MIPs  were obtained to evaluate the vascular anatomy. CONTRAST:  136mL OMNIPAQUE IOHEXOL 350 MG/ML SOLN COMPARISON:  Radiograph 09/13/2019 FINDINGS: Cardiovascular: Satisfactory opacification the pulmonary arteries to the segmental level. No pulmonary artery filling defects are identified. Central pulmonary arteries are normal caliber. Normal heart size. No pericardial effusion. The aorta is normal caliber. Normal 3 vessel branching of the aortic arch. Proximal great vessels are unremarkable. 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 No mediastinal fluid or gas. Normal thyroid gland and thoracic inlet. No acute abnormality of the trachea or esophagus. No worrisome mediastinal, hilar or axillary adenopathy. Lungs/Pleura: No consolidation, features of edema, pneumothorax, or effusion. No suspicious pulmonary nodules or masses. The Upper Abdomen: No acute abnormalities present in the visualized portions of the upper abdomen. Musculoskeletal: No acute osseous abnormality or suspicious osseous lesion.  Bilateral metallic nipple ornamentation. No worrisome chest wall lesions or mass. Review of the MIP images confirms the above findings. IMPRESSION: 1. No evidence of pulmonary embolism or other acute cardiopulmonary process. 2. 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 Electronically Signed   By: Kreg Shropshire M.D.   On: 09/13/2019 22:44   DG Chest Port 1 View  Result Date: 09/13/2019 CLINICAL DATA:  Shortness of breath, dyspnea EXAM: PORTABLE CHEST 1 VIEW COMPARISON:  08/03/2015 FINDINGS: The heart size and mediastinal contours are within normal limits. Both lungs are clear. The visualized skeletal structures are unremarkable. IMPRESSION: No acute abnormality  of the lungs in AP portable projection. Electronically Signed   By: Lauralyn Primes M.D.   On: 09/13/2019 19:10    EKG: Independently reviewed. EKG #1 Vent. rate 130 BPM PR interval 116 ms QRS duration 56 ms QT/QTc 292/429 ms P-R-T axes 82 80 -49 Sinus tachycardia T wave abnormality, consider inferior ischemia Abnormal ECG  EKG #2 Vent. rate 99 BPM PR interval * ms QRS duration 74 ms QT/QTc 358/460 ms P-R-T axes 85 75 18 Sinus rhythm Borderline T wave abnormalities, anterior leads. Improved T wave abnormality when compared to EKG #1.  Assessment/Plan Principal Problem:   COVID-19 virus infection Dyspnea and exertional chest pressure. Her troponin level has been mildly elevated No elevation of inflammatory markers. Single dose dexamethasone 4 mg IVP. Continue gentle IV hydration and symptoms treatment. Follow-up CBC, CMP and inflammatory markers  Active Problems:   Elevated troponin Check echocardiogram. Consult cardiology.    Hypokalemia Replacing. Supplemental magnesium as well. Follow-up potassium level.    Asthma exacerbation No significant wheezing. Frequent dry cough though. Supplemental oxygen as needed. Bronchodilators as needed.    Tobacco use Tobacco cessation info to be provided by the staff. Nicotine replacement therapy as needed.    DVT prophylaxis: Lovenox SQ. Code Status:   Full code Family Communication: Disposition Plan:   Patient is from:  Home.  Anticipated DC to:  Home.  Anticipated DC date:  24 to 48 hours.  Anticipated DC barriers: Echocardiogram and cardiology evaluation.  Consults called:  Routine cardiology evaluation. Admission status:  Observation/telemetry.  Severity of Illness: Moderate.    Bobette Mo MD [facility]  How to contact the [facility] Attending or Consulting provider 7A - 7P or covering provider during after hours 7P -7A, for this patient?   1. Check the care team in [facility] and look for a)  attending/consulting [facility] provider listed and b) the [facility] team listed 2. Log into [email or web address] and use [place]'s universal password to access. If you do not have the password, please contact the hospital operator. 3. Locate the [facility] provider you are looking for under [facility] and page to a number that you can be directly reached. 4. If you still have difficulty reaching the provider, please page the [facility] (Director on Call) for the Hospitalists listed on amion for assistance.  09/14/2019, 1:36 AM   This document was prepared using Dragon voice recognition software and may contain some unintended transcription errors.

## 2019-09-14 NOTE — ED Notes (Signed)
Date and time results received: 09/14/19 12:27 AM  (use smartphrase ".now" to insert current time)  Test: Trop  Critical Value: 367  Name of Provider Notified: Lynden Oxford, PA  Orders Received? Or Actions Taken?: See chart

## 2019-09-15 ENCOUNTER — Encounter (HOSPITAL_COMMUNITY): Payer: Self-pay | Admitting: Internal Medicine

## 2019-09-15 ENCOUNTER — Ambulatory Visit: Payer: No Typology Code available for payment source

## 2019-09-15 ENCOUNTER — Other Ambulatory Visit: Payer: Self-pay

## 2019-09-15 DIAGNOSIS — I214 Non-ST elevation (NSTEMI) myocardial infarction: Secondary | ICD-10-CM

## 2019-09-15 DIAGNOSIS — I249 Acute ischemic heart disease, unspecified: Secondary | ICD-10-CM | POA: Diagnosis not present

## 2019-09-15 DIAGNOSIS — I319 Disease of pericardium, unspecified: Secondary | ICD-10-CM | POA: Diagnosis present

## 2019-09-15 DIAGNOSIS — K5909 Other constipation: Secondary | ICD-10-CM | POA: Diagnosis present

## 2019-09-15 DIAGNOSIS — E876 Hypokalemia: Secondary | ICD-10-CM | POA: Diagnosis present

## 2019-09-15 DIAGNOSIS — Z820 Family history of epilepsy and other diseases of the nervous system: Secondary | ICD-10-CM | POA: Diagnosis not present

## 2019-09-15 DIAGNOSIS — J45901 Unspecified asthma with (acute) exacerbation: Secondary | ICD-10-CM | POA: Diagnosis present

## 2019-09-15 DIAGNOSIS — F17210 Nicotine dependence, cigarettes, uncomplicated: Secondary | ICD-10-CM | POA: Diagnosis present

## 2019-09-15 DIAGNOSIS — Z791 Long term (current) use of non-steroidal anti-inflammatories (NSAID): Secondary | ICD-10-CM | POA: Diagnosis not present

## 2019-09-15 DIAGNOSIS — Z8249 Family history of ischemic heart disease and other diseases of the circulatory system: Secondary | ICD-10-CM | POA: Diagnosis not present

## 2019-09-15 DIAGNOSIS — I514 Myocarditis, unspecified: Secondary | ICD-10-CM | POA: Diagnosis present

## 2019-09-15 DIAGNOSIS — U071 COVID-19: Secondary | ICD-10-CM | POA: Diagnosis present

## 2019-09-15 DIAGNOSIS — Z79899 Other long term (current) drug therapy: Secondary | ICD-10-CM | POA: Diagnosis not present

## 2019-09-15 DIAGNOSIS — Z83438 Family history of other disorder of lipoprotein metabolism and other lipidemia: Secondary | ICD-10-CM | POA: Diagnosis not present

## 2019-09-15 DIAGNOSIS — R0602 Shortness of breath: Secondary | ICD-10-CM | POA: Diagnosis present

## 2019-09-15 DIAGNOSIS — Z823 Family history of stroke: Secondary | ICD-10-CM | POA: Diagnosis not present

## 2019-09-15 LAB — CBC
HCT: 40 % (ref 36.0–46.0)
Hemoglobin: 12.7 g/dL (ref 12.0–15.0)
MCH: 26.8 pg (ref 26.0–34.0)
MCHC: 31.8 g/dL (ref 30.0–36.0)
MCV: 84.6 fL (ref 80.0–100.0)
Platelets: 231 10*3/uL (ref 150–400)
RBC: 4.73 MIL/uL (ref 3.87–5.11)
RDW: 15.7 % — ABNORMAL HIGH (ref 11.5–15.5)
WBC: 12.4 10*3/uL — ABNORMAL HIGH (ref 4.0–10.5)
nRBC: 0 % (ref 0.0–0.2)

## 2019-09-15 LAB — TROPONIN I (HIGH SENSITIVITY)
Troponin I (High Sensitivity): 2226 ng/L (ref <18)
Troponin I (High Sensitivity): 2723 ng/L (ref <18)

## 2019-09-15 LAB — COMPREHENSIVE METABOLIC PANEL
ALT: 12 U/L (ref 0–44)
AST: 24 U/L (ref 15–41)
Albumin: 3.2 g/dL — ABNORMAL LOW (ref 3.5–5.0)
Alkaline Phosphatase: 45 U/L (ref 38–126)
Anion gap: 9 (ref 5–15)
BUN: 6 mg/dL (ref 6–20)
CO2: 21 mmol/L — ABNORMAL LOW (ref 22–32)
Calcium: 8.1 mg/dL — ABNORMAL LOW (ref 8.9–10.3)
Chloride: 106 mmol/L (ref 98–111)
Creatinine, Ser: 0.66 mg/dL (ref 0.44–1.00)
GFR calc Af Amer: >60 mL/min (ref >60)
GFR calc non Af Amer: >60 mL/min (ref >60)
Glucose, Bld: 99 mg/dL (ref 70–99)
Potassium: 4.6 mmol/L (ref 3.5–5.1)
Sodium: 136 mmol/L (ref 135–145)
Total Bilirubin: 0.5 mg/dL (ref 0.3–1.2)
Total Protein: 5.9 g/dL — ABNORMAL LOW (ref 6.5–8.1)

## 2019-09-15 LAB — HEMOGLOBIN A1C
Hgb A1c MFr Bld: 5.4 % (ref 4.8–5.6)
Mean Plasma Glucose: 108.28 mg/dL

## 2019-09-15 LAB — HEPARIN LEVEL (UNFRACTIONATED): Heparin Unfractionated: 0.55 IU/mL (ref 0.30–0.70)

## 2019-09-15 LAB — MAGNESIUM: Magnesium: 1.8 mg/dL (ref 1.7–2.4)

## 2019-09-15 LAB — SEDIMENTATION RATE: Sed Rate: 5 mm/hr (ref 0–22)

## 2019-09-15 MED ORDER — HEPARIN BOLUS VIA INFUSION
3000.0000 [IU] | Freq: Once | INTRAVENOUS | Status: AC
  Administered 2019-09-15: 3000 [IU] via INTRAVENOUS
  Filled 2019-09-15: qty 3000

## 2019-09-15 MED ORDER — FLUTICASONE PROPIONATE 50 MCG/ACT NA SUSP
2.0000 | Freq: Every day | NASAL | Status: DC
  Administered 2019-09-16 – 2019-09-19 (×3): 2 via NASAL
  Filled 2019-09-15 (×4): qty 16

## 2019-09-15 MED ORDER — HEPARIN (PORCINE) 25000 UT/250ML-% IV SOLN
700.0000 [IU]/h | INTRAVENOUS | Status: DC
  Administered 2019-09-15: 600 [IU]/h via INTRAVENOUS
  Filled 2019-09-15 (×2): qty 250

## 2019-09-15 MED ORDER — ATORVASTATIN CALCIUM 80 MG PO TABS
80.0000 mg | ORAL_TABLET | Freq: Every day | ORAL | Status: DC
  Administered 2019-09-15 – 2019-09-17 (×3): 80 mg via ORAL
  Filled 2019-09-15 (×3): qty 1

## 2019-09-15 MED ORDER — ASPIRIN EC 325 MG PO TBEC
325.0000 mg | DELAYED_RELEASE_TABLET | Freq: Every day | ORAL | Status: DC
  Filled 2019-09-15: qty 1

## 2019-09-15 MED ORDER — METOPROLOL TARTRATE 12.5 MG HALF TABLET
12.5000 mg | ORAL_TABLET | Freq: Two times a day (BID) | ORAL | Status: DC
  Administered 2019-09-15 (×2): 12.5 mg via ORAL
  Filled 2019-09-15 (×2): qty 1

## 2019-09-15 MED ORDER — ASPIRIN 81 MG PO CHEW
81.0000 mg | CHEWABLE_TABLET | Freq: Every day | ORAL | Status: DC
  Administered 2019-09-15 – 2019-09-19 (×4): 81 mg via ORAL
  Filled 2019-09-15 (×4): qty 1

## 2019-09-15 MED ORDER — LORATADINE 10 MG PO TABS
10.0000 mg | ORAL_TABLET | Freq: Every day | ORAL | Status: DC
  Administered 2019-09-15 – 2019-09-19 (×5): 10 mg via ORAL
  Filled 2019-09-15 (×5): qty 1

## 2019-09-15 NOTE — Progress Notes (Signed)
ANTICOAGULATION CONSULT NOTE  Pharmacy Consult for Heparin Indication: chest pain/ACS  No Known Allergies  Patient Measurements: Height: 5' (152.4 cm) Weight: 52.3 kg (115 lb 4.8 oz) IBW/kg (Calculated) : 45.5  Vital Signs:    Labs: Recent Labs    09/13/19 1902 09/13/19 2344 09/14/19 0344 09/14/19 1645 09/15/19 1156 09/15/19 1624  HGB 13.7  --   --   --   --  12.7  HCT 43.7  --   --   --   --  40.0  PLT 288  --   --   --   --  231  HEPARINUNFRC  --   --   --   --   --  0.55  CREATININE 0.70  --   --   --   --   --   TROPONINIHS 194*   < > 570* 688* 2,226*  --    < > = values in this interval not displayed.    Estimated Creatinine Clearance: 74.5 mL/min (by C-G formula based on SCr of 0.7 mg/dL).   Medical History: Past Medical History:  Diagnosis Date  . Asthma   . Constipation 08/12/2014  . Tobacco use   . Vitamin D deficiency 08/30/2016    Assessment: 29 yo female with chest discomfort and elevated troponin in the setting of Covid infection.  Pharmacy consulted to initiate heparin for ACS.  Patient got 1 dose of enoxaparin 40 mg, yesterday at 1000 am.  Not on any anticoagulants PTA.  Initial heparin level at goal this afternoon (0.55) on 600 units/hr. CBC appears stable, no bleeding noted.   Goal of Therapy:  Heparin level 0.3-0.7 units/ml Monitor platelets by anticoagulation protocol: Yes   Plan:  Continue heparin infusion at 600 units/hr Check anti-Xa level daily while on heparin Continue to monitor H&H and platelets  Sheppard Coil PharmD., BCPS Clinical Pharmacist 09/15/2019 5:12 PM

## 2019-09-15 NOTE — Progress Notes (Signed)
CRITICAL VALUE ALERT  Critical Value:  Troponin  Date & Time Notied:  09/15/19 @ 1323  Provider Notified: Dr. Flora Lipps - Cardiology (bedside) Orders Received/Actions taken: EKG, Covid retest

## 2019-09-15 NOTE — Progress Notes (Signed)
PROGRESS NOTE                                                                                                                                                                                                             Patient Demographics:    Madeline Davis, is a 29 y.o. female, DOB - November 19, 1990, KGM:[id number]  Outpatient Primary MD for the patient is Patient, No Pcp Per   Admit date - 09/13/2019   LOS - 0  Chief Complaint  Patient presents with  . Cough       Brief Narrative: Patient is a 29 y.o. female with PMHx of bronchial asthma-who was diagnosed with COVID-19 on admission-presented to the ED at [facility] with exertional chest pain and dyspnea along with palpitations.  For the past 1 week prior to this hospitalization-patient was having URI/allergy-like symptoms.  Significant Events: 4/15>> admit to [facility] for exertional chest pain and shortness of breath  COVID-19 medications: Steroids: Not started Remdesivir: Not started  Antibiotics: None  Microbiology data: None  DVT prophylaxis: IV heparin  Procedures: None  Consults: Cardiology    Subjective:    Lilyanne Staley today is fine at rest-but continues to have palpitations, chest pain and dyspnea even with ambulation to the bathroom.   Assessment  & Plan :   Chest pain/exertional dyspnea with elevated troponin: Stable at rest but continues to have-chest pain described as tightness/heaviness with even exertion to the bathroom along with tachycardia to the 140s and shortness of breath.  CTA chest negative negative for PE-echo without any regional wall motion abnormalities-but with elevated troponin levels.  EKG without any features consistent with pericarditis.  Given that she is positive for COVID-19-could have subtle myopericarditis and even could have thrombosis causing ACS.  Cardiology evaluation pending-starting IV heparin and splint in the  meantime.  COVID-19 infection: Without pneumonia on imaging studies-CRP not significantly elevated.  Acknowledges URI/allergic-like symptoms for the past 1 week.  Do not think she requires Remdesivir steroid at this point.  Supportive care with Claritin and Flonase for now.  Follow clinical trajectory and inflammatory markers.  Fever: afebrile  O2 requirements:  SpO2: 99 %   COVID-19 Labs: Recent Labs    09/13/19 1902 09/14/19 0115  DDIMER  --  <0.27  FERRITIN 10*  --   CRP 0.6  --  Component Value Date/Time   BNP 43.0 09/13/2019 1902    No results for input(s): PROCALCITON in the last 168 hours.  Lab Results  Component Value Date   SARSCOV2NAA POSITIVE (A) 09/14/2019    Prone/Incentive Spirometry: encouraged  incentive spirometry use 3-4/hour.  Bronchial asthma: No signs of exacerbation-continue bronchodilators. ABG:    Component Value Date/Time   TCO2 25 08/10/2011 0553    Vent Settings: N/A  Condition - Stable  Family Communication  : Mother was on the phone with the patient via FaceTime-while I was in the room interviewing/examining the patient (patient provided consent for the mother to be on while I was talking to her)  Code Status :  Full Code  Diet :  Diet Order            Diet Heart Room service appropriate? Yes; Fluid consistency: Thin  Diet effective now               Disposition Plan  : Home when medically stable  Barriers to discharge: Concern for ACS-on IV heparin-still having active symptoms.  Antimicorbials  :    Anti-infectives (From admission, onward)   None      Inpatient Medications  Scheduled Meds: . vitamin C  500 mg Oral Daily  . potassium chloride SA  40 mEq Oral Once  . zinc sulfate  220 mg Oral Daily   Continuous Infusions: . heparin Stopped (09/15/19 1015)   PRN Meds:.acetaminophen **OR** acetaminophen, chlorpheniramine-HYDROcodone, guaiFENesin-dextromethorphan, Ipratropium-Albuterol, ondansetron **OR**  ondansetron (ZOFRAN) IV   Time Spent in minutes  25  See all Orders from today for further details   Jeoffrey Massed M.D on 09/15/2019 at 10:57 AM  To page go to [email or web address] - use universal password  [facility] -  Office  [phone]    Objective:   Vitals:   09/14/19 2136 09/14/19 2300 09/14/19 2338 09/15/19 0312  BP: 121/90 114/75  102/82  Pulse: 98 93  (!) 106  Resp: 17 18  17   Temp: 98.4 F (36.9 C) 98.2 F (36.8 C)  98 F (36.7 C)  TempSrc: Oral Oral  Oral  SpO2: 100% 100%  99%  Weight:   52.3 kg   Height:        Wt Readings from Last 3 Encounters:  09/14/19 52.3 kg  08/01/18 51.5 kg  09/21/17 52.2 kg     Intake/Output Summary (Last 24 hours) at 09/15/2019 1057 Last data filed at 09/14/2019 2351 Gross per 24 hour  Intake 1029.98 ml  Output 300 ml  Net 729.98 ml     Physical Exam Gen Exam:Alert awake-not in any distress HEENT:atraumatic, normocephalic Chest: B/L clear to auscultation anteriorly CVS:S1S2 regular Abdomen:soft non tender, non distended Extremities:no edema Neurology: Non focal Skin: no rash   Data Review:    CBC Recent Labs  Lab 09/13/19 1902  WBC 7.6  HGB 13.7  HCT 43.7  PLT 288  MCV 85.4  MCH 26.8  MCHC 31.4  RDW 15.4  LYMPHSABS 1.6  MONOABS 0.6  EOSABS 0.2  BASOSABS 0.0    Chemistries  Recent Labs  Lab 09/13/19 1902 09/13/19 2344  NA 134*  --   K 3.4*  --   CL 102  --   CO2 22  --   GLUCOSE 91  --   BUN 8  --   CREATININE 0.70  --   CALCIUM 8.1*  --   MG  --  1.8   ------------------------------------------------------------------------------------------------------------------ No results for input(s): CHOL, HDL, LDLCALC, TRIG,  CHOLHDL, LDLDIRECT in the last 72 hours.  Lab Results  Component Value Date   HGBA1C 5.2 08/25/2016   ------------------------------------------------------------------------------------------------------------------ No results for input(s): TSH, T4TOTAL, T3FREE,  THYROIDAB in the last 72 hours.  Invalid input(s): FREET3 ------------------------------------------------------------------------------------------------------------------ Recent Labs    09/13/19 1902  FERRITIN 10*    Coagulation profile No results for input(s): INR, PROTIME in the last 168 hours.  Recent Labs    09/14/19 0115  DDIMER <0.27    Cardiac Enzymes No results for input(s): CKMB, TROPONINI, MYOGLOBIN in the last 168 hours.  Invalid input(s): CK ------------------------------------------------------------------------------------------------------------------    Component Value Date/Time   BNP 43.0 09/13/2019 1902    Micro Results Recent Results (from the past 240 hour(s))  Respiratory Panel by RT PCR (Flu A&B, Covid) - Nasopharyngeal Swab     Status: Abnormal   Collection Time: 09/14/19 12:16 AM   Specimen: Nasopharyngeal Swab  Result Value Ref Range Status   SARS Coronavirus 2 by RT PCR POSITIVE (A) NEGATIVE Final    Comment: RESULT CALLED TO, READ BACK BY AND VERIFIED WITH: M DOSS,RN@[id number]  09/14/19 MKELLY (NOTE) SARS-CoV-2 target nucleic acids are DETECTED. SARS-CoV-2 RNA is generally detectable in upper respiratory specimens  during the acute phase of infection. Positive results are indicative of the presence of the identified virus, but do not rule out bacterial infection or co-infection with other pathogens not detected by the test. Clinical correlation with patient history and other diagnostic information is necessary to determine patient infection status. The expected result is Negative. Fact Sheet for Patients:  https://www.moore.com/https://www.fda.gov/media/142436/download Fact Sheet for Healthcare Providers: https://www.young.biz/https://www.fda.gov/media/142435/download This test is not yet approved or cleared by the [place] FDA and  has been authorized for detection and/or diagnosis of SARS-CoV-2 by FDA under an Emergency Use Authorization (EUA).  This EUA will remain in effect  (meaning this test can be used) for th e duration of  the COVID-19 declaration under Section 564(b)(1) of the Act, 21 U.S.C. section 360bbb-3(b)(1), unless the authorization is terminated or revoked sooner.    Influenza A by PCR NEGATIVE NEGATIVE Final   Influenza B by PCR NEGATIVE NEGATIVE Final    Comment: (NOTE) The Xpert Xpress SARS-CoV-2/FLU/RSV assay is intended as an aid in  the diagnosis of influenza from Nasopharyngeal swab specimens and  should not be used as a sole basis for treatment. Nasal washings and  aspirates are unacceptable for Xpert Xpress SARS-CoV-2/FLU/RSV  testing. Fact Sheet for Patients: https://www.moore.com/https://www.fda.gov/media/142436/download Fact Sheet for Healthcare Providers: https://www.young.biz/https://www.fda.gov/media/142435/download This test is not yet approved or cleared by the [place] FDA and  has been authorized for detection and/or diagnosis of SARS-CoV-2 by  FDA under an Emergency Use Authorization (EUA). This EUA will remain  in effect (meaning this test can be used) for the duration of the  Covid-19 declaration under Section 564(b)(1) of the Act, 21  U.S.C. section 360bbb-3(b)(1), unless the authorization is  terminated or revoked. Performed at [facility], [address] [place], [place] [address]     Radiology Reports CT Angio Chest PE W/Cm &/Or Wo Cm  Result Date: 09/13/2019 CLINICAL DATA:  Shortness of breath EXAM: CT ANGIOGRAPHY CHEST WITH CONTRAST TECHNIQUE: Multidetector CT imaging of the chest was performed using the standard protocol during bolus administration of intravenous contrast. Multiplanar CT image reconstructions and MIPs were obtained to evaluate the vascular anatomy. CONTRAST:  100mL OMNIPAQUE IOHEXOL 350 MG/ML SOLN COMPARISON:  Radiograph 09/13/2019 FINDINGS: Cardiovascular: Satisfactory opacification the pulmonary arteries to the segmental level. No pulmonary artery filling defects  are identified. Central pulmonary arteries are normal caliber. Normal  heart size. No pericardial effusion. The aorta is normal caliber. Normal 3 vessel branching of the aortic arch. Proximal great vessels are unremarkable. 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 No mediastinal fluid or gas. Normal thyroid gland and thoracic inlet. No acute abnormality of the trachea or esophagus. No worrisome mediastinal, hilar or axillary adenopathy. Lungs/Pleura: No consolidation, features of edema, pneumothorax, or effusion. No suspicious pulmonary nodules or masses. The Upper Abdomen: No acute abnormalities present in the visualized portions of the upper abdomen. Musculoskeletal: No acute osseous abnormality or suspicious osseous lesion. Bilateral metallic nipple ornamentation. No worrisome chest wall lesions or mass. Review of the MIP images confirms the above findings. IMPRESSION: 1. No evidence of pulmonary embolism or other acute cardiopulmonary process. 2. 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 Electronically Signed   By: Kreg Shropshire M.D.   On: 09/13/2019 22:44   DG Chest Port 1 View  Result Date: 09/13/2019 CLINICAL DATA:  Shortness of breath, dyspnea EXAM: PORTABLE CHEST 1 VIEW COMPARISON:  08/03/2015 FINDINGS: The heart size and mediastinal contours are within normal limits. Both lungs are clear. The visualized skeletal structures are unremarkable. IMPRESSION: No acute abnormality of the lungs in AP portable projection. Electronically Signed   By: Lauralyn Primes M.D.   On: 09/13/2019 19:10   ECHOCARDIOGRAM COMPLETE  Result Date: 09/14/2019    ECHOCARDIOGRAM REPORT   Patient Name:   Donata [facility] Date of Exam: 09/14/2019 Medical Rec #:  [id number]   Height:       60.0 in Accession #:    [id number]  Weight:        117.0 lb Date of Birth:  August 12, 1990   BSA:          1.486 m Patient Age:    28 years    BP:           112/76 mmHg Patient Gender: F           HR:           92 bpm. [facility] Procedure: 2D Echo Indications:    Elevated Troponin  History:        Patient has no prior history of Echocardiogram examinations.                 Risk Factors:Current Smoker. COVID-19 virus infection, Elevated                 troponin.  Sonographer:    Jeryl Columbia RDCS (AE) Referring Phys: [id number] DAVID MANUEL ORTIZ IMPRESSIONS  1. Left ventricular ejection fraction, by estimation, is 60 to 65%. The left ventricle has normal function. The left ventricle has no regional wall motion abnormalities. Left ventricular diastolic parameters were normal.  2. Right ventricular systolic function is normal. The right ventricular size is normal.  3. The mitral valve is normal in structure. Trivial mitral valve regurgitation.  4. The aortic valve is tricuspid. Aortic valve regurgitation is not visualized. No aortic stenosis is present.  5. The inferior vena cava is small suggestive of low RAP. FINDINGS  Left Ventricle: Left ventricular ejection fraction, by estimation, is 60 to 65%. The left ventricle has normal function. The left ventricle has no regional wall motion abnormalities.  The left ventricular internal cavity size was normal in size. There is  no left ventricular hypertrophy. Left ventricular diastolic parameters were normal. Right Ventricle: The right ventricular size is normal. No increase in right ventricular wall thickness. Right ventricular systolic function is normal. Left Atrium: Left atrial size was normal in size. Right Atrium: Right atrial size was normal in size. Pericardium: There is no evidence of pericardial effusion. Mitral Valve: The mitral valve is normal in structure. Trivial mitral valve regurgitation. Tricuspid Valve: The tricuspid valve is normal in structure. Tricuspid valve regurgitation is not  demonstrated. Aortic Valve: The aortic valve is tricuspid. Aortic valve regurgitation is not visualized. No aortic stenosis is present. Pulmonic Valve: The pulmonic valve was not well visualized. Pulmonic valve regurgitation is not visualized. Aorta: The aortic root is normal in size and structure. Venous: The inferior vena cava is small suggestive of low RAP. IAS/Shunts: No atrial level shunt detected by color flow Doppler.  LEFT VENTRICLE PLAX 2D LVIDd:         4.06 cm Diastology LVIDs:         2.66 cm LV e' lateral:   10.10 cm/s LV PW:         1.20 cm LV E/e' lateral: 6.9 LV IVS:        0.90 cm LV e' medial:    11.50 cm/s                        LV E/e' medial:  6.0  RIGHT VENTRICLE RV S prime:     10.90 cm/s TAPSE (M-mode): 1.7 cm LEFT ATRIUM           Index LA diam:      2.30 cm 1.55 cm/m LA Vol (A4C): 29.0 ml 19.51 ml/m   AORTA Ao Root diam: 2.20 cm MITRAL VALVE MV Area (PHT): 3.65 cm MV Decel Time: 208 msec MV E velocity: 69.40 cm/s MV A velocity: 44.60 cm/s MV E/A ratio:  1.56 Kate Sable MD Electronically signed by Kate Sable MD Signature Date/Time: 09/14/2019/2:58:26 PM    Final

## 2019-09-15 NOTE — Plan of Care (Signed)

## 2019-09-15 NOTE — Consult Note (Addendum)
Cardiology Consultation:  Patient ID: Madeline Davis MRN: [id number]; DOB: 10-31-1990  Admit date: 09/13/2019 Date of Consult: 09/15/2019  Primary Care Provider: Patient, No Pcp Per Primary Cardiologist: Reatha Harps, MD   Patient Profile:  Madeline Davis is a 29 y.o. female with a hx of asthma, tobacco use, chronic constipation, and vitamin D deficiency  who is being seen today for the evaluation of elevated troponin at the request of Dr. Jerral Ralph.  History of Present Illness:   [name] has not been seen by cardiology in the past. No known history of MI, stent, heart failure, arrhythmia, stroke, diabetes, HTN, HLD. Patient is an every day smoker, 1/4 ppd. She occasionally uses alcohol. Denies drug use. Family history positive for MI in her father at 35 and stents in her grandmother (mom's side). She takes albuterol inhaler as needed. 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 She is not very active at baseline.   The patient presented to the ED at [facility] 09/14/19 for shortness of breath, chest pain, palpitations. She reports when she exerts herself she gets rapid heart beat sensation and chest pressure. The pain and symptoms resolve with cessation of activity. She denies fevers, chills, cough, or SOB. No prior covid exposure. No real covid symptoms reported to me.   In the ED BP 114/77, pulse 125, afebrile, 99%, RR 18. In the ED sodium 134, potassium 3.4, calcium 8.1. Hs troponin 194>367>570>688. WBC 7.6. Hgb 13.7.  EKG shows sinus rhythm, 99 bpm, TWI V3. She was given 1 L IVF. CXR with no acute abnormality. D-dimer 0.27. CTA chest negative for PE. Respiratory panel came back positive for COVID. The patient was transferred to [facility] for further evaluation.   Heart Pathway Score:       Past Medical History: Past Medical History:  Diagnosis Date  . Asthma   . Constipation 08/12/2014  . Tobacco use   . Vitamin D deficiency 08/30/2016    Past Surgical History: History reviewed. No pertinent surgical  history.   Home Medications:  Prior to Admission medications   Medication Sig Start Date End Date Taking? Authorizing Provider  albuterol (PROVENTIL) (2.5 MG/3ML) 0.083% nebulizer solution Take 3 mLs (2.5 mg total) by nebulization every 6 (six) hours as needed for wheezing or shortness of breath. 09/28/18  Yes Waldon Merl, PA-C  albuterol (VENTOLIN HFA) 108 (90 Base) MCG/ACT inhaler Inhale 1-2 puffs into the lungs every 6 (six) hours as needed for wheezing or shortness of breath.   Yes [provider]  ibuprofen (ADVIL,MOTRIN) 800 MG tablet TK 1 T PO Q 6 TO 8 H PRN 07/12/18   [provider]    Inpatient Medications: Scheduled Meds: . vitamin C  500 mg Oral Daily  . aspirin EC  325 mg Oral Daily  . fluticasone  2 spray Each Nare Daily  . loratadine  10 mg Oral Daily  . potassium chloride SA  40 mEq Oral Once  . zinc sulfate  220 mg Oral Daily   Continuous Infusions: . heparin 600 Units/hr (09/15/19 1212)   PRN Meds: acetaminophen **OR** acetaminophen, chlorpheniramine-HYDROcodone, guaiFENesin-dextromethorphan, Ipratropium-Albuterol, ondansetron **OR** ondansetron (ZOFRAN) IV  Allergies:    No Known Allergies  Social History:   Social History   Socioeconomic History  . Marital status: Single    Spouse name: Not on file  . Number of children: Not on file  . Years of education: Not on file  . Highest education level: Not on file  Occupational History  . Not  on file  Tobacco Use  . Smoking status: Current Every Day Smoker    Packs/day: 0.25    Years: 8.00    Pack years: 2.00    Types: Cigarettes  . Smokeless tobacco: Never Used  Substance and Sexual Activity  . Alcohol use: Yes    Comment: occasionally  . Drug use: Yes    Types: Marijuana    Comment: not since september of 2012  . Sexual activity: Yes    Birth control/protection: None    Comment: female partner  Other Topics Concern  . Not on file  Social History Narrative  . Not on file    Social Determinants of Health   Financial Resource Strain:   . Difficulty of Paying Living Expenses:   Food Insecurity:   . Worried About Programme researcher, broadcasting/film/video in the Last Year:   . Barista in the Last Year:   Transportation Needs:   . Freight forwarder (Medical):   Marland Kitchen Lack of Transportation (Non-Medical):   Physical Activity:   . Days of Exercise per Week:   . Minutes of Exercise per Session:   Stress:   . Feeling of Stress :   Social Connections:   . Frequency of Communication with Friends and Family:   . Frequency of Social Gatherings with Friends and Family:   . Attends Religious Services:   . Active Member of Clubs or Organizations:   . Attends Banker Meetings:   Marland Kitchen Marital Status:   Intimate Partner Violence:   . Fear of Current or Ex-Partner:   . Emotionally Abused:   Marland Kitchen Physically Abused:   . Sexually Abused:      Family History:    Family History  Problem Relation Age of Onset  . Hypertension Mother   . Hypertension Father   . Hyperlipidemia Father   . Stroke Father   . Heart attack Father 22  . Alzheimer's disease Maternal Grandmother   . Alzheimer's disease Maternal Grandfather      ROS:  All other ROS reviewed and negative. Pertinent positives noted in the HPI.     Physical Exam/Data:   Vitals:   09/14/19 2136 09/14/19 2300 09/14/19 2338 09/15/19 0312  BP: 121/90 114/75  102/82  Pulse: 98 93  (!) 106  Resp: 17 18  17   Temp: 98.4 F (36.9 C) 98.2 F (36.8 C)  98 F (36.7 C)  TempSrc: Oral Oral  Oral  SpO2: 100% 100%  99%  Weight:   52.3 kg   Height:         Intake/Output Summary (Last 24 hours) at 09/15/2019 1250 Last data filed at 09/14/2019 2351 Gross per 24 hour  Intake 1029.98 ml  Output 300 ml  Net 729.98 ml    Last 3 Weights 09/14/2019 09/13/2019 08/01/2018  Weight (lbs) 115 lb 4.8 oz 117 lb 113 lb 8 oz  Weight (kg) 52.3 kg 53.071 kg 51.483 kg    Body mass index is 22.52 kg/m.  General: Well nourished, well  developed, in no acute distress Head: Atraumatic, normal size  Eyes: PEERLA, EOMI  Neck: Supple, no JVD Endocrine: No thryomegaly Cardiac: Normal S1, S2; RRR; no murmurs, rubs, or gallops Lungs: Clear to auscultation bilaterally, no wheezing, rhonchi or rales  Abd: Soft, nontender, no hepatomegaly  Ext: No edema, pulses 2+ Musculoskeletal: No deformities, BUE and BLE strength normal and equal Skin: Warm and dry, no rashes   Neuro: Alert and oriented to person, place, time, and  situation, CNII-XII grossly intact, no focal deficits  Psych: Normal mood and affect    EKG:  The EKG was personally reviewed and demonstrates:  NSR, 99bpm, nonspecific ST T changes Telemetry:  Telemetry was personally reviewed and demonstrates:  Sinus tachycardia with sinus rhythm, HR around 100 with some elevation to 120s  Relevant CV Studies:  Echo 09/14/19 1. Left ventricular ejection fraction, by estimation, is 60 to 65%. The  left ventricle has normal function. The left ventricle has no regional  wall motion abnormalities. Left ventricular diastolic parameters were  normal.  2. Right ventricular systolic function is normal. The right ventricular  size is normal.  3. The mitral valve is normal in structure. Trivial mitral valve  regurgitation.  4. The aortic valve is tricuspid. Aortic valve regurgitation is not  visualized. No aortic stenosis is present.  5. The inferior vena cava is small suggestive of low RAP.   Laboratory Data: High Sensitivity Troponin:   Recent Labs  Lab 09/13/19 1902 09/13/19 2344 09/14/19 0344 09/14/19 1645  TROPONINIHS 194* 367* 570* 688*     Cardiac EnzymesNo results for input(s): TROPONINI in the last 168 hours. No results for input(s): TROPIPOC in the last 168 hours.  Chemistry Recent Labs  Lab 09/13/19 1902  NA 134*  K 3.4*  CL 102  CO2 22  GLUCOSE 91  BUN 8  CREATININE 0.70  CALCIUM 8.1*  GFRNONAA >60  GFRAA >60  ANIONGAP 10    No results for  input(s): PROT, ALBUMIN, AST, ALT, ALKPHOS, BILITOT in the last 168 hours. Hematology Recent Labs  Lab 09/13/19 1902  WBC 7.6  RBC 5.12*  HGB 13.7  HCT 43.7  MCV 85.4  MCH 26.8  MCHC 31.4  RDW 15.4  PLT 288   BNP Recent Labs  Lab 09/13/19 1902  BNP 43.0    DDimer  Recent Labs  Lab 09/14/19 0115  DDIMER <0.27    Radiology/Studies:  CT Angio Chest PE W/Cm &/Or Wo Cm  Result Date: 09/13/2019 CLINICAL DATA:  Shortness of breath EXAM: CT ANGIOGRAPHY CHEST WITH CONTRAST TECHNIQUE: Multidetector CT imaging of the chest was performed using the standard protocol during bolus administration of intravenous contrast. Multiplanar CT image reconstructions and MIPs were obtained to evaluate the vascular anatomy. CONTRAST:  184mL OMNIPAQUE IOHEXOL 350 MG/ML SOLN COMPARISON:  Radiograph 09/13/2019 FINDINGS: Cardiovascular: Satisfactory opacification the pulmonary arteries to the segmental level. No pulmonary artery filling defects are identified. Central pulmonary arteries are normal caliber. Normal heart size. No pericardial effusion. The aorta is normal caliber. Normal 3 vessel branching of the aortic arch. Proximal great vessels are unremarkable. 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 No mediastinal fluid or gas. Normal thyroid gland and thoracic inlet. No acute abnormality of the trachea or esophagus. No worrisome mediastinal, hilar or axillary adenopathy. Lungs/Pleura: No consolidation, features of edema, pneumothorax, or effusion. No suspicious pulmonary nodules or masses. The Upper Abdomen: No acute abnormalities present in the visualized portions of the upper abdomen. Musculoskeletal: No acute osseous abnormality or suspicious osseous lesion. Bilateral metallic nipple ornamentation. No worrisome chest wall lesions or mass. Review of the MIP images  confirms the above findings. IMPRESSION: 1. No evidence of pulmonary embolism or other acute cardiopulmonary process. 2. 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 Electronically  Signed   By: Kreg Shropshire M.D.   On: 09/13/2019 22:44   DG Chest Port 1 View  Result Date: 09/13/2019 CLINICAL DATA:  Shortness of breath, dyspnea EXAM: PORTABLE CHEST 1 VIEW COMPARISON:  08/03/2015 FINDINGS: The heart size and mediastinal contours are within normal limits. Both lungs are clear. The visualized skeletal structures are unremarkable. IMPRESSION: No acute abnormality of the lungs in [facility] portable projection. Electronically Signed   By: Lauralyn Primes M.D.   On: 09/13/2019 19:10   ECHOCARDIOGRAM COMPLETE  Result Date: 09/14/2019    ECHOCARDIOGRAM REPORT   Patient Name:   [facility] Date of Exam: 09/14/2019 Medical Rec #:  [id number]   Height:       60.0 in Accession #:    [id number]  Weight:       117.0 lb Date of Birth:  10-23-1990   BSA:          1.486 m Patient Age:    28 years    BP:           112/76 mmHg Patient Gender: F           HR:           92 bpm. [facility]am [facility] Procedure: 2D Echo Indications:    Elevated Troponin  History:        Patient has no prior history of Echocardiogram examinations.                 Risk Factors:Current Smoker. COVID-19 virus infection, Elevated                 troponin.  Sonographer:    Jeryl Columbia RDCS (AE) Referring Phys: [id number] DAVID MANUEL ORTIZ IMPRESSIONS  1. Left ventricular ejection fraction, by estimation, is 60 to 65%. The left ventricle has normal function. The left ventricle has no regional wall motion abnormalities. Left ventricular diastolic parameters were normal.  2. Right ventricular systolic function is normal. The right ventricular size is normal.  3. The mitral valve is normal in structure. Trivial mitral valve regurgitation.  4.  The aortic valve is tricuspid. Aortic valve regurgitation is not visualized. No aortic stenosis is present.  5. The inferior vena cava is small suggestive of low RAP. FINDINGS  Left Ventricle: Left ventricular ejection fraction, by estimation, is 60 to 65%. The left ventricle has normal function. The left ventricle has no regional wall motion abnormalities. The left ventricular internal cavity size was normal in size. There is  no left ventricular hypertrophy. Left ventricular diastolic parameters were normal. Right Ventricle: The right ventricular size is normal. No increase in right ventricular wall thickness. Right ventricular systolic function is normal. Left Atrium: Left atrial size was normal in size. Right Atrium: Right atrial size was normal in size. Pericardium: There is no evidence of pericardial effusion. Mitral Valve: The mitral valve is normal in structure. Trivial mitral valve regurgitation. Tricuspid Valve: The tricuspid valve is normal in structure. Tricuspid valve regurgitation is not demonstrated. Aortic Valve: The aortic valve is tricuspid. Aortic valve regurgitation is not visualized. No aortic stenosis is present. Pulmonic Valve: The pulmonic valve was not well visualized. Pulmonic valve regurgitation is not visualized. Aorta: The aortic root is normal in size and structure. Venous: The inferior vena cava is small suggestive of low RAP. IAS/Shunts: No atrial level shunt detected by color flow Doppler.  LEFT VENTRICLE PLAX 2D LVIDd:         4.06 cm Diastology LVIDs:  2.66 cm LV e' lateral:   10.10 cm/s LV PW:         1.20 cm LV E/e' lateral: 6.9 LV IVS:        0.90 cm LV e' medial:    11.50 cm/s                        LV E/e' medial:  6.0  RIGHT VENTRICLE RV S prime:     10.90 cm/s TAPSE (M-mode): 1.7 cm LEFT ATRIUM           Index LA diam:      2.30 cm 1.55 cm/m LA Vol (A4C): 29.0 ml 19.51 ml/m   AORTA Ao Root diam: 2.20 cm MITRAL VALVE MV Area (PHT): 3.65 cm MV Decel Time: 208 msec MV  E velocity: 69.40 cm/s MV A velocity: 44.60 cm/s MV E/A ratio:  1.56 Prentice DockerSuresh Koneswaran MD Electronically signed by Prentice DockerSuresh Koneswaran MD Signature Date/Time: 09/14/2019/2:58:26 PM    Final     Assessment and Plan:   1. Chest pain/NSTEMI -she presents with typical angina symptoms and has elevated troponin. Echo at [facility] without WMA and normal LVEF.  -EKG with ST and nonspecific ST T changes. No ST elevation. Stat EKG pending this afternoon.  -She is too young for plaque rupture in my opinion. LDL 100 in 2018 and repeat pending. She is covid positive with URI symptoms but really no strong chest symptoms to suggest myocarditis. CRP Given troponin rise I suspect coronary issue. Could be SCAD or thrombotic event in setting of covid. Clearly needs evaluation and is quite concerning.  -She will have to have a cardiac cath. Given lack of symptoms at rest, we can plan to do this Monday.  -pregnancy test negative  -Given 325 mg aspirin yesterday, continue 81 mg QD -I have added lipitor 80 mg QD -continue heparin drip -will add metoprolol 12.5 mg BID -will repeat limited echo as troponin continues to rise.  -She will need stat EKGs if she develops chest pain at rest as she may need cardiac cath sooner. Please notify cardiology on call of any change in her condition.   For questions or updates, please contact [facility] Please consult [email or web address] for contact info under   Signed, Gerri SporeWesley T. Flora Lipps'Neal, MD [facility]  [facility]  09/15/2019 12:50 PM

## 2019-09-15 NOTE — Progress Notes (Signed)
ANTICOAGULATION CONSULT NOTE - Initial Consult  Pharmacy Consult for Heparin Indication: chest pain/ACS  No Known Allergies  Patient Measurements: Height: 5' (152.4 cm) Weight: 52.3 kg (115 lb 4.8 oz) IBW/kg (Calculated) : 45.5  Vital Signs: Temp: 98 F (36.7 C) (04/17 0312) Temp Source: Oral (04/17 0312) BP: 102/82 (04/17 0312) Pulse Rate: 106 (04/17 0312)  Labs: Recent Labs    09/13/19 1902 09/13/19 1902 09/13/19 2344 09/14/19 0344 09/14/19 1645  HGB 13.7  --   --   --   --   HCT 43.7  --   --   --   --   PLT 288  --   --   --   --   CREATININE 0.70  --   --   --   --   TROPONINIHS 194*   < > 367* 570* 688*   < > = values in this interval not displayed.    Estimated Creatinine Clearance: 74.5 mL/min (by C-G formula based on SCr of 0.7 mg/dL).   Medical History: Past Medical History:  Diagnosis Date  . Asthma   . Constipation 08/12/2014  . Tobacco use   . Vitamin D deficiency 08/30/2016    Assessment: 29 yo female with chest discomfort and elevated troponin in the setting of Covid infection.  Pharmacy consulted to initiate heparin for ACS.  Patient got 1 dose of enoxaparin 40 mg, yesterday at 1000 am.  Not on any anticoagulants PTA.  Goal of Therapy:  Heparin level 0.3-0.7 units/ml Monitor platelets by anticoagulation protocol: Yes   Plan:  Give 3000 units bolus x 1 Start heparin infusion at 600 units/hr Check anti-Xa level in 6 hours and daily while on heparin Continue to monitor H&H and platelets  Jeanella Cara, PharmD, [facility] Clinical Pharmacist Please see AMION for all Pharmacists' Contact Phone Numbers 09/15/2019, 10:02 AM

## 2019-09-15 NOTE — Plan of Care (Signed)
Plan of care initiated.

## 2019-09-16 ENCOUNTER — Other Ambulatory Visit: Payer: Self-pay

## 2019-09-16 ENCOUNTER — Inpatient Hospital Stay (HOSPITAL_COMMUNITY): Payer: HRSA Program

## 2019-09-16 LAB — TROPONIN I (HIGH SENSITIVITY)
Troponin I (High Sensitivity): 2497 ng/L (ref <18)
Troponin I (High Sensitivity): 2368 ng/L (ref <18)

## 2019-09-16 LAB — COMPREHENSIVE METABOLIC PANEL
ALT: 10 U/L (ref 0–44)
AST: 21 U/L (ref 15–41)
Albumin: 3 g/dL — ABNORMAL LOW (ref 3.5–5.0)
Alkaline Phosphatase: 43 U/L (ref 38–126)
Anion gap: 6 (ref 5–15)
BUN: 6 mg/dL (ref 6–20)
CO2: 23 mmol/L (ref 22–32)
Calcium: 8.1 mg/dL — ABNORMAL LOW (ref 8.9–10.3)
Chloride: 107 mmol/L (ref 98–111)
Creatinine, Ser: 0.68 mg/dL (ref 0.44–1.00)
GFR calc Af Amer: >60 mL/min (ref >60)
GFR calc non Af Amer: >60 mL/min (ref >60)
Glucose, Bld: 95 mg/dL (ref 70–99)
Potassium: 3.9 mmol/L (ref 3.5–5.1)
Sodium: 136 mmol/L (ref 135–145)
Total Bilirubin: 0.5 mg/dL (ref 0.3–1.2)
Total Protein: 5.5 g/dL — ABNORMAL LOW (ref 6.5–8.1)

## 2019-09-16 LAB — CBC WITH DIFFERENTIAL/PLATELET
Abs Immature Granulocytes: 0.02 10*3/uL (ref 0.00–0.07)
Basophils Absolute: 0 10*3/uL (ref 0.0–0.1)
Basophils Relative: 0 %
Eosinophils Absolute: 0.3 10*3/uL (ref 0.0–0.5)
Eosinophils Relative: 3 %
HCT: 38.3 % (ref 36.0–46.0)
Hemoglobin: 12 g/dL (ref 12.0–15.0)
Immature Granulocytes: 0 %
Lymphocytes Relative: 34 %
Lymphs Abs: 3.5 10*3/uL (ref 0.7–4.0)
MCH: 26.8 pg (ref 26.0–34.0)
MCHC: 31.3 g/dL (ref 30.0–36.0)
MCV: 85.5 fL (ref 80.0–100.0)
Monocytes Absolute: 0.7 10*3/uL (ref 0.1–1.0)
Monocytes Relative: 6 %
Neutro Abs: 5.8 10*3/uL (ref 1.7–7.7)
Neutrophils Relative %: 57 %
Platelets: 211 10*3/uL (ref 150–400)
RBC: 4.48 MIL/uL (ref 3.87–5.11)
RDW: 15.9 % — ABNORMAL HIGH (ref 11.5–15.5)
WBC: 10.4 10*3/uL (ref 4.0–10.5)
nRBC: 0 % (ref 0.0–0.2)

## 2019-09-16 LAB — LIPID PANEL
Cholesterol: 150 mg/dL (ref 0–200)
HDL: 21 mg/dL — ABNORMAL LOW (ref >40)
LDL Cholesterol: 101 mg/dL — ABNORMAL HIGH (ref 0–99)
Total CHOL/HDL Ratio: 7.1 RATIO
Triglycerides: 139 mg/dL (ref <150)
VLDL: 28 mg/dL (ref 0–40)

## 2019-09-16 LAB — HEPARIN LEVEL (UNFRACTIONATED)
Heparin Unfractionated: 0.25 IU/mL — ABNORMAL LOW (ref 0.30–0.70)
Heparin Unfractionated: 0.49 IU/mL (ref 0.30–0.70)

## 2019-09-16 LAB — FERRITIN: Ferritin: 17 ng/mL (ref 11–307)

## 2019-09-16 LAB — C-REACTIVE PROTEIN: CRP: 0.7 mg/dL (ref <1.0)

## 2019-09-16 LAB — MAGNESIUM: Magnesium: 1.8 mg/dL (ref 1.7–2.4)

## 2019-09-16 LAB — D-DIMER, QUANTITATIVE: D-Dimer, Quant: <0.27 ug/mL-FEU (ref 0.00–0.50)

## 2019-09-16 MED ORDER — SODIUM CHLORIDE 0.9 % WEIGHT BASED INFUSION
3.0000 mL/kg/h | INTRAVENOUS | Status: AC
  Administered 2019-09-17: 3 mL/kg/h via INTRAVENOUS

## 2019-09-16 MED ORDER — SODIUM CHLORIDE 0.9% FLUSH
3.0000 mL | Freq: Two times a day (BID) | INTRAVENOUS | Status: DC
  Administered 2019-09-17: 3 mL via INTRAVENOUS

## 2019-09-16 MED ORDER — METOPROLOL TARTRATE 25 MG PO TABS
25.0000 mg | ORAL_TABLET | Freq: Two times a day (BID) | ORAL | Status: DC
  Administered 2019-09-16 (×2): 25 mg via ORAL
  Filled 2019-09-16 (×5): qty 1

## 2019-09-16 MED ORDER — SODIUM CHLORIDE 0.9 % IV SOLN: 250.0000 mL | INTRAVENOUS | Status: DC | PRN

## 2019-09-16 MED ORDER — SODIUM CHLORIDE 0.9 % WEIGHT BASED INFUSION: 1.0000 mL/kg/h | INTRAVENOUS | Status: DC

## 2019-09-16 MED ORDER — ASPIRIN 81 MG PO CHEW
81.0000 mg | CHEWABLE_TABLET | ORAL | Status: AC
  Administered 2019-09-17: 81 mg via ORAL
  Filled 2019-09-16: qty 1

## 2019-09-16 MED ORDER — MAGNESIUM SULFATE 2 GM/50ML IV SOLN
2.0000 g | Freq: Once | INTRAVENOUS | Status: AC
  Administered 2019-09-16: 2 g via INTRAVENOUS
  Filled 2019-09-16: qty 50

## 2019-09-16 MED ORDER — SODIUM CHLORIDE 0.9% FLUSH: 3.0000 mL | INTRAVENOUS | Status: DC | PRN

## 2019-09-16 NOTE — Progress Notes (Signed)
Pre-cath orders written per verbal request from Dr. Flora Lipps. He discussed procedure with patient. He recommended standard fluids. Name added to add-on board. Lyza Houseworth PA-C

## 2019-09-16 NOTE — Progress Notes (Signed)
ANTICOAGULATION CONSULT NOTE  Pharmacy Consult for Heparin Indication: chest pain/ACS  No Known Allergies  Patient Measurements: Height: 5' (152.4 cm) Weight: 52.3 kg (115 lb 4.8 oz) IBW/kg (Calculated) : 45.5  Vital Signs: Temp: 99 F (37.2 C) (04/18 0445) Temp Source: Oral (04/18 0445) BP: 119/61 (04/18 0445) Pulse Rate: 99 (04/18 0445)  Labs: Recent Labs    09/13/19 1902 09/13/19 1902 09/13/19 2344 09/14/19 1645 09/15/19 1156 09/15/19 1624 09/16/19 0448  HGB 13.7   < >  --   --   --  12.7 12.0  HCT 43.7  --   --   --   --  40.0 38.3  PLT 288  --   --   --   --  231 211  HEPARINUNFRC  --   --   --   --   --  0.55 0.25*  CREATININE 0.70  --   --   --   --  0.66 0.68  TROPONINIHS 194*  --    < > 688* 2,226* 2,723*  --    < > = values in this interval not displayed.    Estimated Creatinine Clearance: 74.5 mL/min (by C-G formula based on SCr of 0.68 mg/dL).   Assessment: 29 yo female with chest discomfort and elevated troponin in the setting of Covid infection.  Pharmacy consulted to initiate heparin for ACS.  Patient got 1 dose of enoxaparin 40 mg, yesterday at 1000 am.  Not on any anticoagulants PTA.  Heparin level down to subtherapeutic( 0.25) on gtt at 600 units/hr. No issues with line or bleeding reported per RN.  Goal of Therapy:  Heparin level 0.3-0.7 units/ml Monitor platelets by anticoagulation protocol: Yes   Plan:  Increase heparin infusion to 700 units/hr Will f/u 6 hr heparin level  Christoper Fabian, PharmD, BCPS Please see amion for complete clinical pharmacist phone list 09/16/2019 6:42 AM

## 2019-09-16 NOTE — Progress Notes (Signed)
ANTICOAGULATION CONSULT NOTE  Pharmacy Consult for Heparin Indication: chest pain/ACS  No Known Allergies  Patient Measurements: Height: 5' (152.4 cm) Weight: 52.3 kg (115 lb 4.8 oz) IBW/kg (Calculated) : 45.5  Vital Signs: Temp: 98.7 F (37.1 C) (04/18 0819) Temp Source: Oral (04/18 0819) BP: 110/70 (04/18 0819) Pulse Rate: 103 (04/18 0819)  Labs: Recent Labs    09/13/19 1902 09/13/19 2344 09/15/19 1624 09/16/19 0448 09/16/19 0926 09/16/19 1246  HGB 13.7  --  12.7 12.0  --   --   HCT 43.7  --  40.0 38.3  --   --   PLT 288  --  231 211  --   --   HEPARINUNFRC  --   --  0.55 0.25*  --  0.49  CREATININE 0.70  --  0.66 0.68  --   --   TROPONINIHS 194*   < > 2,723* 2,497* 2,368*  --    < > = values in this interval not displayed.    Estimated Creatinine Clearance: 74.5 mL/min (by C-G formula based on SCr of 0.68 mg/dL).   Assessment: 29 yo female with chest discomfort and elevated troponin in the setting of Covid infection.  Pharmacy consulted to initiate heparin for ACS.  Patient got 1 dose of enoxaparin 40 mg, yesterday at 1000 am.  Not on any anticoagulants PTA.  Heparin level therapeutic( 0.49) on gtt at 700 units/hr. No issues with line or bleeding reported per RN.  Goal of Therapy:  Heparin level 0.3-0.7 units/ml Monitor platelets by anticoagulation protocol: Yes   Plan:  Continue heparin infusion at 700 units/hr Will f/u with am heparin level and CBC  Jeanella Cara, PharmD, [facility] Clinical Pharmacist Please see AMION for all Pharmacists' Contact Phone Numbers 09/16/2019, 2:24 PM

## 2019-09-16 NOTE — Progress Notes (Signed)
PROGRESS NOTE                                                                                                                                                                                                             Patient Demographics:    Madeline Davis, is a 29 y.o. female, DOB - 09/11/1990, ZOX:[id number]  Outpatient Primary MD for the patient is Patient, No Pcp Per   Admit date - 09/13/2019   LOS - 1  Chief Complaint  Patient presents with  . Cough       Brief Narrative: Patient is a 29 y.o. female with PMHx of bronchial asthma-who was diagnosed with COVID-19 on admission-[facility]ed to the ED at [facility] with exertional chest pain and dyspnea along with palpitations.  For the past 1 week prior to this hospitalization-patient was having URI/allergy-like symptoms.  Significant Events: 4[facility]15>> [facility] pain and shortness of breath  COVID-19 medications: Steroids: Not started Remdesivir: Not started  Antibiotics: None  Microbiology data: None  DVT prophylaxis: IV heparin  Procedures: None  Consults: Cardiology    Subjective:   Stable at rest-continues to have tachycardia and chest pain along with shortness of breath with ambulation.   Assessment  & Plan :   Chest pain/exertional dyspnea with elevated troponin-likely Non-STEMI: Remains essentially unchanged-on IV heparin, aspirin, statin and beta-blocker.  LHC planned for tomorrow.  Cardiology following.  CTA chest negative for PE, TTE without any regional wall motion abnormalities.  COVID-19 infection: Without pneumonia on imaging studies-CRP not significantly elevated.  Acknowledges URI/allergic-like symptoms for the past 1 week.  Do not think she requires Remdesivir steroid at this point.  Supportive care with Claritin and Flonase for now.  Follow clinical trajectory and inflammatory markers.  Fever: afebrile  O2  requirements:  SpO2: 100 %   COVID-19 Labs: Recent Labs    09/13/19 1902 09/14/19 0115 09/16/19 0448  DDIMER  --  <0.27 <0.27  FERRITIN 10*  --  17  CRP 0.6  --  0.7       Component Value Date/Time   BNP 43.0 09/13/2019 1902    No results for input(s): PROCALCITON in the last 168 hours.  Lab Results  Component Value Date   SARSCOV2NAA POSITIVE (A) 09/14/2019    Prone/Incentive Spirometry: encouraged  incentive spirometry use 3-4/hour.  Bronchial asthma: No signs of exacerbation-continue bronchodilators. ABG:    Component Value Date/Time   TCO2 25 08/10/2011 0553    Vent Settings: N/A  Condition - Stable  Family Communication  : Patient will update family herself-I have asked her to let me know if family members have questions for [place].  Code Status :  Full Code  Diet :  Diet Order            Diet NPO time specified Except for: Sips with Meds  Diet effective midnight        Diet Heart Room service appropriate? Yes; Fluid consistency: Thin  Diet effective now               Disposition Plan  : Home when medically stable  Barriers to discharge: Concern for ACS-on IV heparin-still having active symptoms.  Antimicorbials  :    Anti-infectives (From admission, onward)   None      Inpatient Medications  Scheduled Meds: . vitamin C  500 mg Oral Daily  . aspirin  81 mg Oral Daily  . atorvastatin  80 mg Oral Daily  . fluticasone  2 spray Each Nare Daily  . loratadine  10 mg Oral Daily  . metoprolol tartrate  25 mg Oral BID  . potassium chloride SA  40 mEq Oral Once  . sodium chloride flush  3 mL Intravenous Q12H  . zinc sulfate  220 mg Oral Daily   Continuous Infusions: . heparin 700 Units/hr (09/16/19 0651)   PRN Meds:.acetaminophen **OR** acetaminophen, chlorpheniramine-HYDROcodone, guaiFENesin-dextromethorphan, Ipratropium-Albuterol, ondansetron **OR** ondansetron (ZOFRAN) IV   Time Spent in minutes  25  See all Orders from today for further  details   Jeoffrey Massed M.D on 09/16/2019 at 12:18 PM  To page go to [email or web address] - use universal password  [facility] -  Office  [phone]    Objective:   Vitals:   09/16/19 0030 09/16/19 0031 09/16/19 0445 09/16/19 0819  BP:   119/61 110/70  Pulse: (!) 111 (!) 108 99 (!) 103  Resp:  19 17 (!) 22  Temp:   99 F (37.2 C) 98.7 F (37.1 C)  TempSrc:   Oral Oral  SpO2: 100% 100% 98% 100%  Weight:      Height:        Wt Readings from Last 3 Encounters:  09/14/19 52.3 kg  08/01/18 51.5 kg  09/21/17 52.2 kg     Intake/Output Summary (Last 24 hours) at 09/16/2019 1218 Last data filed at 09/15/2019 1500 Gross per 24 hour  Intake 51.68 ml  Output --  Net 51.68 ml     Physical Exam Gen Exam:Alert awake-not in any distress HEENT:atraumatic, normocephalic Chest: B/L clear to auscultation anteriorly CVS:S1S2 regular Abdomen:soft non tender, non distended Extremities:no edema Neurology: Non focal Skin: no rash   Data Review:    CBC Recent Labs  Lab 09/13/19 1902 09/15/19 1624 09/16/19 0448  WBC 7.6 12.4* 10.4  HGB 13.7 12.7 12.0  HCT 43.7 40.0 38.3  PLT 288 231 211  MCV 85.4 84.6 85.5  MCH 26.8 26.8 26.8  MCHC 31.4 31.8 31.3  RDW 15.4 15.7* 15.9*  LYMPHSABS 1.6  --  3.5  MONOABS 0.6  --  0.7  EOSABS 0.2  --  0.3  BASOSABS 0.0  --  0.0    Chemistries  Recent Labs  Lab 09/13/19 1902 09/13/19 2344 09/15/19 1624 09/16/19 0448  NA 134*  --  136 136  K 3.4*  --  4.6  3.9  CL 102  --  106 107  CO2 22  --  21* 23  GLUCOSE 91  --  99 95  BUN 8  --  6 6  CREATININE 0.70  --  0.66 0.68  CALCIUM 8.1*  --  8.1* 8.1*  MG  --  1.8 1.8 1.8  AST  --   --  24 21  ALT  --   --  12 10  ALKPHOS  --   --  45 43  BILITOT  --   --  0.5 0.5   ------------------------------------------------------------------------------------------------------------------ Recent Labs    09/16/19 0448  CHOL 150  HDL 21*  LDLCALC 101*  TRIG 139  CHOLHDL 7.1     Lab Results  Component Value Date   HGBA1C 5.4 09/15/2019   ------------------------------------------------------------------------------------------------------------------ No results for input(s): TSH, T4TOTAL, T3FREE, THYROIDAB in the last 72 hours.  Invalid input(s): FREET3 ------------------------------------------------------------------------------------------------------------------ Recent Labs    09/13/19 1902 09/16/19 0448  FERRITIN 10* 17    Coagulation profile No results for input(s): INR, PROTIME in the last 168 hours.  Recent Labs    09/14/19 0115 09/16/19 0448  DDIMER <0.27 <0.27    Cardiac Enzymes No results for input(s): CKMB, TROPONINI, MYOGLOBIN in the last 168 hours.  Invalid input(s): CK ------------------------------------------------------------------------------------------------------------------    Component Value Date/Time   BNP 43.0 09/13/2019 1902    Micro Results Recent Results (from the past 240 hour(s))  Respiratory Panel by RT PCR (Flu A&B, Covid) - Nasopharyngeal Swab     Status: Abnormal   Collection Time: 09/14/19 12:16 AM   Specimen: Nasopharyngeal Swab  Result Value Ref Range Status   SARS Coronavirus 2 by RT PCR POSITIVE (A) NEGATIVE Final    Comment: RESULT CALLED TO, READ BACK BY AND VERIFIED WITH: M DOSS,RN@[id number]  09/14/19 MKELLY (NOTE) SARS-CoV-2 target nucleic acids are DETECTED. SARS-CoV-2 RNA is generally detectable in upper respiratory specimens  during the acute phase of infection. Positive results are indicative of the presence of the identified virus, but do not rule out bacterial infection or co-infection with other pathogens not detected by the test. Clinical correlation with patient history and other diagnostic information is necessary to determine patient infection status. The expected result is Negative. Fact Sheet for Patients:  PinkCheek.be Fact Sheet for Healthcare  Providers: GravelBags.it This test is not yet approved or cleared by the [place] FDA and  has been authorized for detection and/or diagnosis of SARS-CoV-2 by FDA under an Emergency Use Authorization (EUA).  This EUA will remain in effect (meaning this test can be used) for th e duration of  the COVID-19 declaration under Section 564(b)(1) of the Act, 21 U.S.C. section 360bbb-3(b)(1), unless the authorization is terminated or revoked sooner.    Influenza A by PCR NEGATIVE NEGATIVE Final   Influenza B by PCR NEGATIVE NEGATIVE Final    Comment: (NOTE) The Xpert Xpress SARS-CoV-2/FLU/RSV assay is intended as an aid in  the diagnosis of influenza from Nasopharyngeal swab specimens and  should not be used as a sole basis for treatment. Nasal washings and  aspirates are unacceptable for Xpert Xpress SARS-CoV-2/FLU/RSV  testing. Fact Sheet for Patients: PinkCheek.be Fact Sheet for Healthcare Providers: GravelBags.it This test is not yet approved or cleared by the [place] FDA and  has been authorized for detection and/or diagnosis of SARS-CoV-2 by  FDA under an Emergency Use Authorization (EUA). This EUA will remain  in effect (meaning this test can be used) for the duration of the  Covid-19 declaration under Section 564(b)(1) of the Act, 21  U.S.C. section 360bbb-3(b)(1), unless the authorization is  terminated or revoked. Performed at [facility], [address] [place], [place] [address]     Radiology Reports CT Angio Chest PE W/Cm &/Or Wo Cm  Result Date: 09/13/2019 CLINICAL DATA:  Shortness of breath EXAM: CT ANGIOGRAPHY CHEST WITH CONTRAST TECHNIQUE: Multidetector CT imaging of the chest was performed using the standard protocol during bolus administration of intravenous contrast. Multiplanar CT image reconstructions and MIPs were obtained to evaluate the vascular anatomy. CONTRAST:   OMNIPAQUE IOHEXOL 350 MG/ML SOLN COMPARISON:  Radiograph 09/13/2019 FINDINGS: Cardiovascular: Satisfactory opacification the pulmonary arteries to the segmental level. No pulmonary artery filling defects are identified. Central pulmonary arteries are normal caliber. Normal heart size. No pericardial effusion. The aorta is normal caliber. Normal 3 vessel branching of the aortic arch. Proximal great vessels are unremarkable. 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 No mediastinal fluid or gas. Normal thyroid gland and thoracic inlet. No acute abnormality of the trachea or esophagus. No worrisome mediastinal, hilar or axillary adenopathy. Lungs/Pleura: No consolidation, features of edema, pneumothorax, or effusion. No suspicious pulmonary nodules or masses. The Upper Abdomen: No acute abnormalities present in the visualized portions of the upper abdomen. Musculoskeletal: No acute osseous abnormality or suspicious osseous lesion. Bilateral metallic nipple ornamentation. No worrisome chest wall lesions or mass. Review of the MIP images confirms the above findings. IMPRESSION: 1. No evidence of pulmonary embolism or other acute cardiopulmonary process. 2. 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 Electronically Signed   By: Kreg Shropshire M.D.   On: 09/13/2019 22:44   DG Chest Port 1 View  Result Date: 09/16/2019 CLINICAL DATA:  Dyspnea, COVID-19 positive EXAM: PORTABLE CHEST 1 VIEW COMPARISON:  09/13/2019 chest radiograph. FINDINGS: Stable cardiomediastinal silhouette with normal heart size. No pneumothorax. No pleural effusion. Lungs appear clear, with no acute consolidative airspace disease and no pulmonary edema. IMPRESSION: No active disease.  Electronically Signed   By: Delbert Phenix M.D.   On: 09/16/2019 10:39   DG Chest Port 1 View  Result Date: 09/13/2019 CLINICAL DATA:  Shortness of breath, dyspnea EXAM: PORTABLE CHEST 1 VIEW COMPARISON:  08/03/2015 FINDINGS: The heart size and mediastinal contours are within normal limits. Both lungs are clear. The visualized skeletal structures are unremarkable. IMPRESSION: No acute abnormality of the lungs in AP portable projection. Electronically Signed   By: Lauralyn Primes M.D.   On: 09/13/2019 19:10   ECHOCARDIOGRAM COMPLETE  Result Date: 09/14/2019    ECHOCARDIOGRAM REPORT   Patient Name:   [facility] Date of Exam: 09/14/2019 Medical Rec #:  [id number]   Height:       60.0 in Accession #:    [id number]  Weight:       117.0 lb Date of Birth:  1991/05/05   BSA:          1.486 m Patient Age:    28 years    BP:           112/76 mmHg Patient Gender: F           HR:           92 bpm. [facility]am [facility] Procedure: 2D Echo Indications:    Elevated  Troponin  History:        Patient has no prior history of Echocardiogram examinations.                 Risk Factors:Current Smoker. COVID-19 virus infection, Elevated                 troponin.  Sonographer:    Jeryl Columbia RDCS (AE) Referring Phys: [id number] DAVID MANUEL ORTIZ IMPRESSIONS  1. Left ventricular ejection fraction, by estimation, is 60 to 65%. The left ventricle has normal function. The left ventricle has no regional wall motion abnormalities. Left ventricular diastolic parameters were normal.  2. Right ventricular systolic function is normal. The right ventricular size is normal.  3. The mitral valve is normal in structure. Trivial mitral valve regurgitation.  4. The aortic valve is tricuspid. Aortic valve regurgitation is not visualized. No aortic stenosis is present.  5. The inferior vena cava is small suggestive of low RAP. FINDINGS  Left Ventricle: Left ventricular ejection fraction, by estimation, is 60 to 65%. The left ventricle has normal  function. The left ventricle has no regional wall motion abnormalities. The left ventricular internal cavity size was normal in size. There is  no left ventricular hypertrophy. Left ventricular diastolic parameters were normal. Right Ventricle: The right ventricular size is normal. No increase in right ventricular wall thickness. Right ventricular systolic function is normal. Left Atrium: Left atrial size was normal in size. Right Atrium: Right atrial size was normal in size. Pericardium: There is no evidence of pericardial effusion. Mitral Valve: The mitral valve is normal in structure. Trivial mitral valve regurgitation. Tricuspid Valve: The tricuspid valve is normal in structure. Tricuspid valve regurgitation is not demonstrated. Aortic Valve: The aortic valve is tricuspid. Aortic valve regurgitation is not visualized. No aortic stenosis is present. Pulmonic Valve: The pulmonic valve was not well visualized. Pulmonic valve regurgitation is not visualized. Aorta: The aortic root is normal in size and structure. Venous: The inferior vena cava is small suggestive of low RAP. IAS/Shunts: No atrial level shunt detected by color flow Doppler.  LEFT VENTRICLE PLAX 2D LVIDd:         4.06 cm Diastology LVIDs:         2.66 cm LV e' lateral:   10.10 cm/s LV PW:         1.20 cm LV E/e' lateral: 6.9 LV IVS:        0.90 cm LV e' medial:    11.50 cm/s                        LV E/e' medial:  6.0  RIGHT VENTRICLE RV S prime:     10.90 cm/s TAPSE (M-mode): 1.7 cm LEFT ATRIUM           Index LA diam:      2.30 cm 1.55 cm/m LA Vol (A4C): 29.0 ml 19.51 ml/m   AORTA Ao Root diam: 2.20 cm MITRAL VALVE MV Area (PHT): 3.65 cm MV Decel Time: 208 msec MV E velocity: 69.40 cm/s MV A velocity: 44.60 cm/s MV E/A ratio:  1.56 Prentice Docker MD Electronically signed by Prentice Docker MD Signature Date/Time: 09/14/2019/2:58:26 PM    Final

## 2019-09-16 NOTE — Progress Notes (Addendum)
Cardiology Progress Note  Patient ID: Madeline Davis MRN: [id number] DOB: 10-07-1990 Date of Encounter: 09/16/2019  Primary Cardiologist: Evalina Field, MD  Subjective  Feels better with metoprolol. No CP when sitting. Gets pressure with exertion.   ROS:  All other ROS reviewed and negative. Pertinent positives noted in the HPI.     Inpatient Medications  Scheduled Meds: . vitamin C  500 mg Oral Daily  . aspirin  81 mg Oral Daily  . atorvastatin  80 mg Oral Daily  . fluticasone  2 spray Each Nare Daily  . loratadine  10 mg Oral Daily  . metoprolol tartrate  25 mg Oral BID  . potassium chloride SA  40 mEq Oral Once  . zinc sulfate  220 mg Oral Daily   Continuous Infusions: . heparin 700 Units/hr (09/16/19 0651)  . magnesium sulfate bolus IVPB 2 g (09/16/19 0952)   PRN Meds: acetaminophen **OR** acetaminophen, chlorpheniramine-HYDROcodone, guaiFENesin-dextromethorphan, Ipratropium-Albuterol, ondansetron **OR** ondansetron (ZOFRAN) IV   Vital Signs   Vitals:   09/16/19 0030 09/16/19 0031 09/16/19 0445 09/16/19 0819  BP:   119/61 110/70  Pulse: (!) 111 (!) 108 99 (!) 103  Resp:  19 17 (!) 22  Temp:   99 F (37.2 C) 98.7 F (37.1 C)  TempSrc:   Oral Oral  SpO2: 100% 100% 98% 100%  Weight:      Height:        Intake/Output Summary (Last 24 hours) at 09/16/2019 1035 Last data filed at 09/15/2019 1500 Gross per 24 hour  Intake 51.68 ml  Output --  Net 51.68 ml   Last 3 Weights 09/14/2019 09/13/2019 08/01/2018  Weight (lbs) 115 lb 4.8 oz 117 lb 113 lb 8 oz  Weight (kg) 52.3 kg 53.071 kg 51.483 kg      Telemetry  Overnight telemetry shows ST 110s, which I personally reviewed.   ECG  The most recent ECG shows ST 101, diffuse non-specific ST changes, minimal ST elevation diffusely but no reciprocal changes to suggest STEMI, which I personally reviewed.   Physical Exam   Vitals:   09/16/19 0030 09/16/19 0031 09/16/19 0445 09/16/19 0819  BP:   119/61 110/70  Pulse:  (!) 111 (!) 108 99 (!) 103  Resp:  19 17 (!) 22  Temp:   99 F (37.2 C) 98.7 F (37.1 C)  TempSrc:   Oral Oral  SpO2: 100% 100% 98% 100%  Weight:      Height:         Intake/Output Summary (Last 24 hours) at 09/16/2019 1035 Last data filed at 09/15/2019 1500 Gross per 24 hour  Intake 51.68 ml  Output --  Net 51.68 ml    Last 3 Weights 09/14/2019 09/13/2019 08/01/2018  Weight (lbs) 115 lb 4.8 oz 117 lb 113 lb 8 oz  Weight (kg) 52.3 kg 53.071 kg 51.483 kg    Body mass index is 22.52 kg/m.   General: Well nourished, well developed, in no acute distress Head: Atraumatic, normal size  Eyes: PEERLA, EOMI  Neck: Supple, no JVD Endocrine: No thryomegaly Cardiac: Normal S1, S2; RRR; no murmurs, rubs, or gallops Lungs: Clear to auscultation bilaterally, no wheezing, rhonchi or rales  Abd: Soft, nontender, no hepatomegaly  Ext: No edema, pulses 2+ Musculoskeletal: No deformities, BUE and BLE strength normal and equal Skin: Warm and dry, no rashes   Neuro: Alert and oriented to person, place, time, and situation, CNII-XII grossly intact, no focal deficits  Psych: Normal mood and affect  Labs  High Sensitivity Troponin:   Recent Labs  Lab 09/14/19 1645 09/15/19 1156 09/15/19 1624 09/16/19 0448 09/16/19 0926  TROPONINIHS 688* 2,226* 2,723* 2,497* 2,368*     Cardiac EnzymesNo results for input(s): TROPONINI in the last 168 hours. No results for input(s): TROPIPOC in the last 168 hours.  Chemistry Recent Labs  Lab 09/13/19 1902 09/15/19 1624 09/16/19 0448  NA 134* 136 136  K 3.4* 4.6 3.9  CL 102 106 107  CO2 22 21* 23  GLUCOSE 91 99 95  BUN '8 6 6  '$ CREATININE 0.70 0.66 0.68  CALCIUM 8.1* 8.1* 8.1*  PROT  --  5.9* 5.5*  ALBUMIN  --  3.2* 3.0*  AST  --  24 21  ALT  --  12 10  ALKPHOS  --  45 43  BILITOT  --  0.5 0.5  GFRNONAA >60 >60 >60  GFRAA >60 >60 >60  ANIONGAP '10 9 6    '$ Hematology Recent Labs  Lab 09/13/19 1902 09/15/19 1624 09/16/19 0448  WBC 7.6 12.4*  10.4  RBC 5.12* 4.73 4.48  HGB 13.7 12.7 12.0  HCT 43.7 40.0 38.3  MCV 85.4 84.6 85.5  MCH 26.8 26.8 26.8  MCHC 31.4 31.8 31.3  RDW 15.4 15.7* 15.9*  PLT 288 231 211   BNP Recent Labs  Lab 09/13/19 1902  BNP 43.0    DDimer  Recent Labs  Lab 09/14/19 0115 09/16/19 0448  DDIMER <0.27 <0.27     Radiology  ECHOCARDIOGRAM COMPLETE  Result Date: 09/14/2019    ECHOCARDIOGRAM REPORT   Patient Name:   Sherrice [facility] Date of Exam: 09/14/2019 Medical Rec #:  [id number]   Height:       60.0 in Accession #:    [id number]  Weight:       117.0 lb Date of Birth:  August 19, 1990   BSA:          1.486 m Patient Age:    29 years    BP:           112/76 mmHg Patient Gender: F           HR:           92 bpm. Exam Location:  [facility] Procedure: 2D Echo Indications:    Elevated Troponin  History:        Patient has no prior history of Echocardiogram examinations.                 Risk Factors:Current Smoker. COVID-19 virus infection, Elevated                 troponin.  Sonographer:    Leavy Cella RDCS (AE) Referring Phys: [id number] DAVID MANUEL [place]  1. Left ventricular ejection fraction, by estimation, is 60 to 65%. The left ventricle has normal function. The left ventricle has no regional wall motion abnormalities. Left ventricular diastolic parameters were normal.  2. Right ventricular systolic function is normal. The right ventricular size is normal.  3. The mitral valve is normal in structure. Trivial mitral valve regurgitation.  4. The aortic valve is tricuspid. Aortic valve regurgitation is not visualized. No aortic stenosis is present.  5. The inferior vena cava is small suggestive of low RAP. FINDINGS  Left Ventricle: Left ventricular ejection fraction, by estimation, is 60 to 65%. The left ventricle has normal function. The left ventricle has no regional wall motion abnormalities. The left ventricular internal cavity size was normal in size. There is  no  left ventricular hypertrophy. Left  ventricular diastolic parameters were normal. Right Ventricle: The right ventricular size is normal. No increase in right ventricular wall thickness. Right ventricular systolic function is normal. Left Atrium: Left atrial size was normal in size. Right Atrium: Right atrial size was normal in size. Pericardium: There is no evidence of pericardial effusion. Mitral Valve: The mitral valve is normal in structure. Trivial mitral valve regurgitation. Tricuspid Valve: The tricuspid valve is normal in structure. Tricuspid valve regurgitation is not demonstrated. Aortic Valve: The aortic valve is tricuspid. Aortic valve regurgitation is not visualized. No aortic stenosis is present. Pulmonic Valve: The pulmonic valve was not well visualized. Pulmonic valve regurgitation is not visualized. Aorta: The aortic root is normal in size and structure. Venous: The inferior vena cava is small suggestive of low RAP. IAS/Shunts: No atrial level shunt detected by color flow Doppler.  LEFT VENTRICLE PLAX 2D LVIDd:         4.06 cm Diastology LVIDs:         2.66 cm LV e' lateral:   10.10 cm/s LV PW:         1.20 cm LV E/e' lateral: 6.9 LV IVS:        0.90 cm LV e' medial:    11.50 cm/s                        LV E/e' medial:  6.0  RIGHT VENTRICLE RV S prime:     10.90 cm/s TAPSE (M-mode): 1.7 cm LEFT ATRIUM           Index LA diam:      2.30 cm 1.55 cm/m LA Vol (A4C): 29.0 ml 19.51 ml/m   AORTA Ao Root diam: 2.20 cm MITRAL VALVE MV Area (PHT): 3.65 cm MV Decel Time: 208 msec MV E velocity: 69.40 cm/s MV A velocity: 44.60 cm/s MV E/A ratio:  1.56 Kate Sable MD Electronically signed by Kate Sable MD Signature Date/Time: 09/14/2019/2:58:26 PM    Final     Cardiac Studies  TTE 09/14/2019 1. Left ventricular ejection fraction, by estimation, is 60 to 65%. The  left ventricle has normal function. The left ventricle has no regional  wall motion abnormalities. Left ventricular diastolic parameters were  normal.  2. Right  ventricular systolic function is normal. The right ventricular  size is normal.  3. The mitral valve is normal in structure. Trivial mitral valve  regurgitation.  4. The aortic valve is tricuspid. Aortic valve regurgitation is not  visualized. No aortic stenosis is present.  5. The inferior vena cava is small suggestive of low RAP.   Patient Profile  Defne Lovick is a 29 y.o. female with asthma who was admitted 4/17 with covid URI symptoms and symptoms of CP with exertion, elevated troponin concerning for NSTEMI.   Assessment & Plan   1. Chest pain/NSTEMI -she presents with typical angina symptoms and has elevated troponin. Echo at [facility] without WMA and normal LVEF.  -troponin peaked 2723 and trending down -EKG this AM with nonspecific STT changes and minimal diffuse ST elevation 0.5 mm. Not meeting STEMI criteria. -She is too young for plaque rupture in my opinion. LDL 101. She is covid positive with URI symptoms but really no strong chest symptoms to suggest myocarditis. CRP 0.7 ESR 5. Could be myopericarditis given minimal diffuse ST elevation. CT PE study negative.  Could be SCAD or thrombotic event in setting of covid. Clearly needs evaluation and is quite concerning.  -  continue ASA/staitn/heparin drip. NPO midnight for LHC in AM. I discussed the risks/benefits of left heat cath, including, bleeding, infection, myocardial infarction, stroke, death. She has agreed to proceed despite these risks.  -pregnancy test negative  -continue metoprolol tartrate 12.5 mg BID -will repeat limited echo -She will need stat EKGs if she develops chest pain at rest as she may need cardiac cath sooner. Please notify cardiology on call of any change in her condition.   For questions or updates, please contact [place] Please consult [email or web address] for contact info under   Time Spent with Patient: I have spent a total of 25 minutes with patient reviewing hospital notes, telemetry, EKGs, labs and  examining the patient as well as establishing an assessment and plan that was discussed with the patient.  > 50% of time was spent in direct patient care.    Signed, Addison Naegeli. Audie Box, [place]  09/16/2019 10:35 AM

## 2019-09-17 ENCOUNTER — Inpatient Hospital Stay (HOSPITAL_COMMUNITY): Payer: HRSA Program

## 2019-09-17 ENCOUNTER — Encounter (HOSPITAL_COMMUNITY)
Admission: EM | Disposition: A | Discharge status: Home / Self Care | Payer: Self-pay | Source: Home / Self Care | Attending: Internal Medicine

## 2019-09-17 DIAGNOSIS — U071 COVID-19: Secondary | ICD-10-CM

## 2019-09-17 DIAGNOSIS — R0602 Shortness of breath: Secondary | ICD-10-CM

## 2019-09-17 DIAGNOSIS — I214 Non-ST elevation (NSTEMI) myocardial infarction: Secondary | ICD-10-CM

## 2019-09-17 HISTORY — PX: LEFT HEART CATH AND CORONARY ANGIOGRAPHY: CATH118249

## 2019-09-17 LAB — CBC WITH DIFFERENTIAL/PLATELET
Abs Immature Granulocytes: 0.05 10*3/uL (ref 0.00–0.07)
Basophils Absolute: 0 10*3/uL (ref 0.0–0.1)
Basophils Relative: 0 %
Eosinophils Absolute: 0.3 10*3/uL (ref 0.0–0.5)
Eosinophils Relative: 3 %
HCT: 36.9 % (ref 36.0–46.0)
Hemoglobin: 11.6 g/dL — ABNORMAL LOW (ref 12.0–15.0)
Immature Granulocytes: 0 %
Lymphocytes Relative: 26 %
Lymphs Abs: 2.9 10*3/uL (ref 0.7–4.0)
MCH: 27 pg (ref 26.0–34.0)
MCHC: 31.4 g/dL (ref 30.0–36.0)
MCV: 86 fL (ref 80.0–100.0)
Monocytes Absolute: 0.6 10*3/uL (ref 0.1–1.0)
Monocytes Relative: 6 %
Neutro Abs: 7.3 10*3/uL (ref 1.7–7.7)
Neutrophils Relative %: 65 %
Platelets: 210 10*3/uL (ref 150–400)
RBC: 4.29 MIL/uL (ref 3.87–5.11)
RDW: 15.9 % — ABNORMAL HIGH (ref 11.5–15.5)
WBC: 11.3 10*3/uL — ABNORMAL HIGH (ref 4.0–10.5)
nRBC: 0 % (ref 0.0–0.2)

## 2019-09-17 LAB — COMPREHENSIVE METABOLIC PANEL
ALT: 14 U/L (ref 0–44)
AST: 21 U/L (ref 15–41)
Albumin: 3 g/dL — ABNORMAL LOW (ref 3.5–5.0)
Alkaline Phosphatase: 43 U/L (ref 38–126)
Anion gap: 7 (ref 5–15)
BUN: 5 mg/dL — ABNORMAL LOW (ref 6–20)
CO2: 23 mmol/L (ref 22–32)
Calcium: 7.9 mg/dL — ABNORMAL LOW (ref 8.9–10.3)
Chloride: 107 mmol/L (ref 98–111)
Creatinine, Ser: 0.71 mg/dL (ref 0.44–1.00)
GFR calc Af Amer: >60 mL/min (ref >60)
GFR calc non Af Amer: >60 mL/min (ref >60)
Glucose, Bld: 96 mg/dL (ref 70–99)
Potassium: 3.9 mmol/L (ref 3.5–5.1)
Sodium: 137 mmol/L (ref 135–145)
Total Bilirubin: 0.5 mg/dL (ref 0.3–1.2)
Total Protein: 5.5 g/dL — ABNORMAL LOW (ref 6.5–8.1)

## 2019-09-17 LAB — TROPONIN I (HIGH SENSITIVITY)
Troponin I (High Sensitivity): 1335 ng/L (ref <18)
Troponin I (High Sensitivity): 1408 ng/L (ref <18)

## 2019-09-17 LAB — HEPARIN LEVEL (UNFRACTIONATED): Heparin Unfractionated: 0.36 IU/mL (ref 0.30–0.70)

## 2019-09-17 LAB — ECHOCARDIOGRAM LIMITED
Height: 60 in
Weight: 1844.81 oz

## 2019-09-17 LAB — MAGNESIUM: Magnesium: 1.9 mg/dL (ref 1.7–2.4)

## 2019-09-17 LAB — D-DIMER, QUANTITATIVE: D-Dimer, Quant: 0.3 ug/mL-FEU (ref 0.00–0.50)

## 2019-09-17 LAB — FERRITIN: Ferritin: 17 ng/mL (ref 11–307)

## 2019-09-17 LAB — C-REACTIVE PROTEIN: CRP: 0.7 mg/dL (ref <1.0)

## 2019-09-17 SURGERY — LEFT HEART CATH AND CORONARY ANGIOGRAPHY
Anesthesia: LOCAL

## 2019-09-17 MED ORDER — FENTANYL CITRATE (PF) 100 MCG/2ML IJ SOLN
INTRAMUSCULAR | Status: DC | PRN
  Administered 2019-09-17: 25 ug via INTRAVENOUS

## 2019-09-17 MED ORDER — SODIUM CHLORIDE 0.9 % IV BOLUS
500.0000 mL | Freq: Once | INTRAVENOUS | Status: AC
  Administered 2019-09-17: 500 mL via INTRAVENOUS

## 2019-09-17 MED ORDER — HYDRALAZINE HCL 20 MG/ML IJ SOLN: 10.0000 mg | INTRAMUSCULAR | Status: AC | PRN

## 2019-09-17 MED ORDER — VERAPAMIL HCL 2.5 MG/ML IV SOLN
INTRAVENOUS | Status: AC
  Filled 2019-09-17: qty 2

## 2019-09-17 MED ORDER — ENOXAPARIN SODIUM 40 MG/0.4ML ~~LOC~~ SOLN
40.0000 mg | SUBCUTANEOUS | Status: DC
  Administered 2019-09-18 – 2019-09-19 (×2): 40 mg via SUBCUTANEOUS
  Filled 2019-09-17 (×2): qty 0.4

## 2019-09-17 MED ORDER — MIDAZOLAM HCL 2 MG/2ML IJ SOLN
INTRAMUSCULAR | Status: AC
  Filled 2019-09-17: qty 2

## 2019-09-17 MED ORDER — HEPARIN SODIUM (PORCINE) 1000 UNIT/ML IJ SOLN
INTRAMUSCULAR | Status: DC | PRN
  Administered 2019-09-17: 2500 [IU] via INTRAVENOUS

## 2019-09-17 MED ORDER — VERAPAMIL HCL 2.5 MG/ML IV SOLN
INTRAVENOUS | Status: DC | PRN
  Administered 2019-09-17: 10 mL via INTRA_ARTERIAL

## 2019-09-17 MED ORDER — HEPARIN (PORCINE) IN NACL 1000-0.9 UT/500ML-% IV SOLN
INTRAVENOUS | Status: AC
  Filled 2019-09-17: qty 1000

## 2019-09-17 MED ORDER — LIDOCAINE HCL (PF) 1 % IJ SOLN
INTRAMUSCULAR | Status: AC
  Filled 2019-09-17: qty 30

## 2019-09-17 MED ORDER — LABETALOL HCL 5 MG/ML IV SOLN: 10.0000 mg | INTRAVENOUS | Status: AC | PRN

## 2019-09-17 MED ORDER — SODIUM CHLORIDE 0.9 % IV SOLN: INTRAVENOUS | Status: AC

## 2019-09-17 MED ORDER — FENTANYL CITRATE (PF) 100 MCG/2ML IJ SOLN
INTRAMUSCULAR | Status: AC
  Filled 2019-09-17: qty 2

## 2019-09-17 MED ORDER — MIDAZOLAM HCL 2 MG/2ML IJ SOLN
INTRAMUSCULAR | Status: DC | PRN
  Administered 2019-09-17: 1 mg via INTRAVENOUS

## 2019-09-17 MED ORDER — SODIUM CHLORIDE 0.9% FLUSH
3.0000 mL | Freq: Two times a day (BID) | INTRAVENOUS | Status: DC
  Administered 2019-09-17 – 2019-09-19 (×4): 3 mL via INTRAVENOUS

## 2019-09-17 MED ORDER — HEPARIN (PORCINE) IN NACL 1000-0.9 UT/500ML-% IV SOLN
INTRAVENOUS | Status: DC | PRN
  Administered 2019-09-17 (×2): 500 mL

## 2019-09-17 MED ORDER — LIDOCAINE HCL (PF) 1 % IJ SOLN
INTRAMUSCULAR | Status: DC | PRN
  Administered 2019-09-17: 2 mL

## 2019-09-17 MED ORDER — SODIUM CHLORIDE 0.9% FLUSH: 3.0000 mL | INTRAVENOUS | Status: DC | PRN

## 2019-09-17 MED ORDER — HEPARIN SODIUM (PORCINE) 1000 UNIT/ML IJ SOLN
INTRAMUSCULAR | Status: AC
  Filled 2019-09-17: qty 1

## 2019-09-17 MED ORDER — SODIUM CHLORIDE 0.9 % IV SOLN: 250.0000 mL | INTRAVENOUS | Status: DC | PRN

## 2019-09-17 MED ORDER — IOHEXOL 350 MG/ML SOLN
INTRAVENOUS | Status: DC | PRN
  Administered 2019-09-17: 40 mL

## 2019-09-17 SURGICAL SUPPLY — 10 items
CATH 5FR JL3.5 JR4 ANG PIG MP (CATHETERS) ×1 IMPLANT
DEVICE RAD TR BAND REGULAR (VASCULAR PRODUCTS) ×1 IMPLANT
GLIDESHEATH SLEND SS 6F .021 (SHEATH) ×1 IMPLANT
GUIDEWIRE INQWIRE 1.5J.035X260 (WIRE) IMPLANT
INQWIRE 1.5J .035X260CM (WIRE) ×2
KIT HEART LEFT (KITS) ×2 IMPLANT
PACK CARDIAC CATHETERIZATION (CUSTOM PROCEDURE TRAY) ×2 IMPLANT
TRANSDUCER W/STOPCOCK (MISCELLANEOUS) ×2 IMPLANT
TUBING CIL FLEX 10 FLL-RA (TUBING) ×2 IMPLANT
WIRE HI TORQ VERSACORE-J 145CM (WIRE) ×1 IMPLANT

## 2019-09-17 NOTE — Progress Notes (Signed)
CCMD tech, Darrell, charted this HR and it caused the patients MEWS to turn red. A set of vitals were done and were stable. There are no further interventions needed.

## 2019-09-17 NOTE — Progress Notes (Signed)
PROGRESS NOTE                                                                                                                                                                                                             Patient Demographics:    Barrie Sigmund, is a 29 y.o. female, DOB - 04/02/1991, EYC:[id number]  Outpatient Primary MD for the patient is Patient, No Pcp Per   Admit date - 09/13/2019   LOS - 2  Chief Complaint  Patient presents with  . Cough       Brief Narrative: Patient is a 29 y.o. female with PMHx of bronchial asthma-who was diagnosed with COVID-19 on admission-presented to the ED at [facility] with exertional chest pain and dyspnea along with palpitations.  For the past 1 week prior to this hospitalization-patient was having URI/allergy-like symptoms.  Significant Events: 4/15>> admit to [facility] for exertional chest pain and shortness of breath  COVID-19 medications: Steroids: Not started Remdesivir: Not started  Antibiotics: None  Microbiology data: None  DVT prophylaxis: IV heparin  Procedures: None  Consults: Cardiology    Subjective:   Remains essentially the same-continues to have chest pain and exertional dyspnea with ambulation.  For LHC today.   Assessment  & Plan :   Chest pain/exertional dyspnea with elevated troponin-likely Non-STEMI: Remains essentially unchanged-on IV heparin, aspirin, statin and beta-blocker.  LHC planned for today-cardiology following-some concern for myopericarditis as well.  CTA chest negative for PE, TTE without any regional wall motion abnormalities.  COVID-19 infection: Without pneumonia on imaging studies-CRP not significantly elevated.  Acknowledges URI/allergic-like symptoms for the past 1 week.  Do not think she requires Remdesivir steroid at this point.  Supportive care with Claritin and Flonase for now.  Follow clinical trajectory and  inflammatory markers.  Fever: afebrile  O2 requirements:  SpO2: 92 %   COVID-19 Labs: Recent Labs    09/16/19 0448 09/17/19 0249  DDIMER <0.27 0.30  FERRITIN 17 17  CRP 0.7 0.7       Component Value Date/Time   BNP 43.0 09/13/2019 1902    No results for input(s): PROCALCITON in the last 168 hours.  Lab Results  Component Value Date   SARSCOV2NAA POSITIVE (A) 09/14/2019    Prone/Incentive Spirometry: encouraged  incentive spirometry use 3-4/hour.  Bronchial asthma: No signs of exacerbation-continue  bronchodilators. ABG:    Component Value Date/Time   TCO2 25 08/10/2011 0553    Vent Settings: N/A  Condition - Stable  Family Communication  : Patient will update family herself-I have asked her to let me know if family members have questions for [place].  Code Status :  Full Code  Diet :  Diet Order            Diet NPO time specified Except for: Sips with Meds  Diet effective midnight               Disposition Plan  : Home when medically stable  Barriers to discharge: Concern for ACS-on IV heparin-still having active symptoms.  Antimicorbials  :    Anti-infectives (From admission, onward)   None      Inpatient Medications  Scheduled Meds: . vitamin C  500 mg Oral Daily  . aspirin  81 mg Oral Daily  . atorvastatin  80 mg Oral Daily  . fluticasone  2 spray Each Nare Daily  . loratadine  10 mg Oral Daily  . metoprolol tartrate  25 mg Oral BID  . potassium chloride SA  40 mEq Oral Once  . sodium chloride flush  3 mL Intravenous Q12H  . zinc sulfate  220 mg Oral Daily   Continuous Infusions: . sodium chloride    . sodium chloride 1 mL/kg/hr (09/17/19 0531)  . heparin 700 Units/hr (09/16/19 0651)   PRN Meds:.sodium chloride, acetaminophen **OR** acetaminophen, chlorpheniramine-HYDROcodone, guaiFENesin-dextromethorphan, Ipratropium-Albuterol, ondansetron **OR** ondansetron (ZOFRAN) IV, sodium chloride flush   Time Spent in minutes  25  See all  Orders from today for further details   Jeoffrey Massed M.D on 09/17/2019 at 12:41 PM  To page go to [email or web address] - use universal password  [facility] -  Office  [phone]    Objective:   Vitals:   09/16/19 2356 09/17/19 0520 09/17/19 0741 09/17/19 1200  BP: (!) 94/58 (!) 85/54 (!) 80/65 (!) 98/56  Pulse: 99 98 86   Resp: 15 15 (!) 22   Temp: 98 F (36.7 C) 99.1 F (37.3 C) 98.1 F (36.7 C) 98.8 F (37.1 C)  TempSrc: Oral Oral Oral Oral  SpO2: 99% 100% 92%   Weight:      Height:        Wt Readings from Last 3 Encounters:  09/14/19 52.3 kg  08/01/18 51.5 kg  09/21/17 52.2 kg     Intake/Output Summary (Last 24 hours) at 09/17/2019 1241 Last data filed at 09/16/2019 2359 Gross per 24 hour  Intake 271.87 ml  Output --  Net 271.87 ml     Physical Exam Gen Exam:Alert awake-not in any distress HEENT:atraumatic, normocephalic Chest: B/L clear to auscultation anteriorly CVS:S1S2 regular Abdomen:soft non tender, non distended Extremities:no edema Neurology: Non focal Skin: no rash   Data Review:    CBC Recent Labs  Lab 09/13/19 1902 09/15/19 1624 09/16/19 0448 09/17/19 0249  WBC 7.6 12.4* 10.4 11.3*  HGB 13.7 12.7 12.0 11.6*  HCT 43.7 40.0 38.3 36.9  PLT 288 231 211 210  MCV 85.4 84.6 85.5 86.0  MCH 26.8 26.8 26.8 27.0  MCHC 31.4 31.8 31.3 31.4  RDW 15.4 15.7* 15.9* 15.9*  LYMPHSABS 1.6  --  3.5 2.9  MONOABS 0.6  --  0.7 0.6  EOSABS 0.2  --  0.3 0.3  BASOSABS 0.0  --  0.0 0.0    Chemistries  Recent Labs  Lab 09/13/19 1902 09/13/19 2344 09/15/19 1624 09/16/19 0448 09/17/19  0249  NA 134*  --  136 136 137  K 3.4*  --  4.6 3.9 3.9  CL 102  --  106 107 107  CO2 22  --  21* 23 23  GLUCOSE 91  --  99 95 96  BUN 8  --  6 6 5*  CREATININE 0.70  --  0.66 0.68 0.71  CALCIUM 8.1*  --  8.1* 8.1* 7.9*  MG  --  1.8 1.8 1.8 1.9  AST  --   --  24 21 21   ALT  --   --  12 10 14   ALKPHOS  --   --  45 43 43  BILITOT  --   --  0.5 0.5 0.5    ------------------------------------------------------------------------------------------------------------------ Recent Labs    09/16/19 0448  CHOL 150  HDL 21*  LDLCALC 101*  TRIG 139  CHOLHDL 7.1    Lab Results  Component Value Date   HGBA1C 5.4 09/15/2019   ------------------------------------------------------------------------------------------------------------------ No results for input(s): TSH, T4TOTAL, T3FREE, THYROIDAB in the last 72 hours.  Invalid input(s): FREET3 ------------------------------------------------------------------------------------------------------------------ Recent Labs    09/16/19 0448 09/17/19 0249  FERRITIN 17 17    Coagulation profile No results for input(s): INR, PROTIME in the last 168 hours.  Recent Labs    09/16/19 0448 09/17/19 0249  DDIMER <0.27 0.30    Cardiac Enzymes No results for input(s): CKMB, TROPONINI, MYOGLOBIN in the last 168 hours.  Invalid input(s): CK ------------------------------------------------------------------------------------------------------------------    Component Value Date/Time   BNP 43.0 09/13/2019 1902    Micro Results Recent Results (from the past 240 hour(s))  Respiratory Panel by RT PCR (Flu A&B, Covid) - Nasopharyngeal Swab     Status: Abnormal   Collection Time: 09/14/19 12:16 AM   Specimen: Nasopharyngeal Swab  Result Value Ref Range Status   SARS Coronavirus 2 by RT PCR POSITIVE (A) NEGATIVE Final    Comment: RESULT CALLED TO, READ BACK BY AND VERIFIED WITH: M DOSS,RN@[id number]  09/14/19 MKELLY (NOTE) SARS-CoV-2 target nucleic acids are DETECTED. SARS-CoV-2 RNA is generally detectable in upper respiratory specimens  during the acute phase of infection. Positive results are indicative of the presence of the identified virus, but do not rule out bacterial infection or co-infection with other pathogens not detected by the test. Clinical correlation with patient history and other  diagnostic information is necessary to determine patient infection status. The expected result is Negative. Fact Sheet for Patients:  https://www.moore.com/https://www.fda.gov/media/142436/download Fact Sheet for Healthcare Providers: https://www.young.biz/https://www.fda.gov/media/142435/download This test is not yet approved or cleared by the [place] FDA and  has been authorized for detection and/or diagnosis of SARS-CoV-2 by FDA under an Emergency Use Authorization (EUA).  This EUA will remain in effect (meaning this test can be used) for th e duration of  the COVID-19 declaration under Section 564(b)(1) of the Act, 21 U.S.C. section 360bbb-3(b)(1), unless the authorization is terminated or revoked sooner.    Influenza A by PCR NEGATIVE NEGATIVE Final   Influenza B by PCR NEGATIVE NEGATIVE Final    Comment: (NOTE) The Xpert Xpress SARS-CoV-2/FLU/RSV assay is intended as an aid in  the diagnosis of influenza from Nasopharyngeal swab specimens and  should not be used as a sole basis for treatment. Nasal washings and  aspirates are unacceptable for Xpert Xpress SARS-CoV-2/FLU/RSV  testing. Fact Sheet for Patients: https://www.moore.com/https://www.fda.gov/media/142436/download Fact Sheet for Healthcare Providers: https://www.young.biz/https://www.fda.gov/media/142435/download This test is not yet approved or cleared by the [place] FDA and  has been authorized for detection and/or diagnosis  of SARS-CoV-2 by  FDA under an Emergency Use Authorization (EUA). This EUA will remain  in effect (meaning this test can be used) for the duration of the  Covid-19 declaration under Section 564(b)(1) of the Act, 21  U.S.C. section 360bbb-3(b)(1), unless the authorization is  terminated or revoked. Performed at [facility], [address], [place], [place] [address]     Radiology Reports CT Angio Chest PE W/Cm &/Or Wo Cm  Result Date: 09/13/2019 CLINICAL DATA:  Shortness of breath EXAM: CT ANGIOGRAPHY CHEST WITH CONTRAST TECHNIQUE: Multidetector CT imaging of the  chest was performed using the standard protocol during bolus administration of intravenous contrast. Multiplanar CT image reconstructions and MIPs were obtained to evaluate the vascular anatomy. CONTRAST:  OMNIPAQUE IOHEXOL 350 MG/ML SOLN COMPARISON:  Radiograph 09/13/2019 FINDINGS: Cardiovascular: Satisfactory opacification the pulmonary arteries to the segmental level. No pulmonary artery filling defects are identified. Central pulmonary arteries are normal caliber. Normal heart size. No pericardial effusion. The aorta is normal caliber. Normal 3 vessel branching of the aortic arch. Proximal great vessels are unremarkable. 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 No mediastinal fluid or gas. Normal thyroid gland and thoracic inlet. No acute abnormality of the trachea or esophagus. No worrisome mediastinal, hilar or axillary adenopathy. Lungs/Pleura: No consolidation, features of edema, pneumothorax, or effusion. No suspicious pulmonary nodules or masses. The Upper Abdomen: No acute abnormalities present in the visualized portions of the upper abdomen. Musculoskeletal: No acute osseous abnormality or suspicious osseous lesion. Bilateral metallic nipple ornamentation. No worrisome chest wall lesions or mass. Review of the MIP images confirms the above findings. IMPRESSION: 1. No evidence of pulmonary embolism or other acute cardiopulmonary process. 2. 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 Electronically Signed   By: Kreg Shropshire M.D.   On: 09/13/2019 22:44   DG Chest Port 1 View  Result Date: 09/16/2019 CLINICAL DATA:  Dyspnea, COVID-19 positive EXAM: PORTABLE CHEST 1 VIEW COMPARISON:  09/13/2019 chest radiograph. FINDINGS: Stable  cardiomediastinal silhouette with normal heart size. No pneumothorax. No pleural effusion. Lungs appear clear, with no acute consolidative airspace disease and no pulmonary edema. IMPRESSION: No active disease. Electronically Signed   By: Delbert Phenix M.D.   On: 09/16/2019 10:39   DG Chest Port 1 View  Result Date: 09/13/2019 CLINICAL DATA:  Shortness of breath, dyspnea EXAM: PORTABLE CHEST 1 VIEW COMPARISON:  08/03/2015 FINDINGS: The heart size and mediastinal contours are within normal limits. Both lungs are clear. The visualized skeletal structures are unremarkable. IMPRESSION: No acute abnormality of the lungs in AP portable projection. Electronically Signed   By: Lauralyn Primes M.D.   On: 09/13/2019 19:10   ECHOCARDIOGRAM COMPLETE  Result Date: 09/14/2019    ECHOCARDIOGRAM REPORT   Patient Name:   [facility] Date of Exam: 09/14/2019 Medical Rec #:  [id number]   Height:       60.0 in Accession #:    [id number]  Weight:       117.0 lb Date of Birth:  05-15-1991   BSA:          1.486 m Patient Age:    28 years    BP:           112/76 mmHg Patient Gender: F  HR:           92 bpm. [facility]am [facility] Procedure: 2D Echo Indications:    Elevated Troponin  History:        Patient has no prior history of Echocardiogram examinations.                 Risk Factors:Current Smoker. COVID-19 virus infection, Elevated                 troponin.  Sonographer:    Jeryl Columbia RDCS (AE) Referring Phys: [id number] DAVID MANUEL ORTIZ IMPRESSIONS  1. Left ventricular ejection fraction, by estimation, is 60 to 65%. The left ventricle has normal function. The left ventricle has no regional wall motion abnormalities. Left ventricular diastolic parameters were normal.  2. Right ventricular systolic function is normal. The right ventricular size is normal.  3. The mitral valve is normal in structure. Trivial mitral valve regurgitation.  4. The aortic valve is tricuspid. Aortic valve regurgitation is not visualized. No  aortic stenosis is present.  5. The inferior vena cava is small suggestive of low RAP. FINDINGS  Left Ventricle: Left ventricular ejection fraction, by estimation, is 60 to 65%. The left ventricle has normal function. The left ventricle has no regional wall motion abnormalities. The left ventricular internal cavity size was normal in size. There is  no left ventricular hypertrophy. Left ventricular diastolic parameters were normal. Right Ventricle: The right ventricular size is normal. No increase in right ventricular wall thickness. Right ventricular systolic function is normal. Left Atrium: Left atrial size was normal in size. Right Atrium: Right atrial size was normal in size. Pericardium: There is no evidence of pericardial effusion. Mitral Valve: The mitral valve is normal in structure. Trivial mitral valve regurgitation. Tricuspid Valve: The tricuspid valve is normal in structure. Tricuspid valve regurgitation is not demonstrated. Aortic Valve: The aortic valve is tricuspid. Aortic valve regurgitation is not visualized. No aortic stenosis is present. Pulmonic Valve: The pulmonic valve was not well visualized. Pulmonic valve regurgitation is not visualized. Aorta: The aortic root is normal in size and structure. Venous: The inferior vena cava is small suggestive of low RAP. IAS/Shunts: No atrial level shunt detected by color flow Doppler.  LEFT VENTRICLE PLAX 2D LVIDd:         4.06 cm Diastology LVIDs:         2.66 cm LV e' lateral:   10.10 cm/s LV PW:         1.20 cm LV E/e' lateral: 6.9 LV IVS:        0.90 cm LV e' medial:    11.50 cm/s                        LV E/e' medial:  6.0  RIGHT VENTRICLE RV S prime:     10.90 cm/s TAPSE (M-mode): 1.7 cm LEFT ATRIUM           Index LA diam:      2.30 cm 1.55 cm/m LA Vol (A4C): 29.0 ml 19.51 ml/m   AORTA Ao Root diam: 2.20 cm MITRAL VALVE MV Area (PHT): 3.65 cm MV Decel Time: 208 msec MV E velocity: 69.40 cm/s MV A velocity: 44.60 cm/s MV E/A ratio:  1.56 Prentice Docker MD Electronically signed by Prentice Docker MD Signature Date/Time: 09/14/2019/2:58:26 PM    Final

## 2019-09-17 NOTE — Interval H&P Note (Signed)
History and Physical Interval Note:  09/17/2019 5:02 PM  Madeline Davis  has presented today for surgery, with the diagnosis of NSTEMI.  The various methods of treatment have been discussed with the patient and family. After consideration of risks, benefits and other options for treatment, the patient has consented to  Procedure(s): LEFT HEART CATH AND CORONARY ANGIOGRAPHY (N/A) as a surgical intervention.  The patient's history has been reviewed, patient examined, no change in status, stable for surgery.  I have reviewed the patient's chart and labs.  Questions were answered to the patient's satisfaction.    Cath Lab Visit (complete for each Cath Lab visit)  Clinical Evaluation Leading to the Procedure:   ACS: Yes.    Non-ACS:  N/A  Carlester Kasparek

## 2019-09-17 NOTE — Progress Notes (Signed)
  Echocardiogram 2D Echocardiogram has been performed.  Burnard Hawthorne 09/17/2019, 3:29 PM

## 2019-09-17 NOTE — Progress Notes (Addendum)
°Cardiology Progress Note  °Patient ID: Madeline Davis °MRN: [id number] °DOB: 04/27/1991 °Date of Encounter: 09/17/2019 ° °Primary Cardiologist: Eureka T O'Neal, MD ° °Subjective  ° °Pt complains of CP with moving /activity   Not just with sitting up or laying flat   Not pleuritic   Breathing is OK   ° °Inpatient Medications  °Scheduled Meds: °• vitamin C  500 mg Oral Daily  °• aspirin  81 mg Oral Daily  °• atorvastatin  80 mg Oral Daily  °• fluticasone  2 spray Each Nare Daily  °• loratadine  10 mg Oral Daily  °• metoprolol tartrate  25 mg Oral BID  °• potassium chloride SA  40 mEq Oral Once  °• sodium chloride flush  3 mL Intravenous Q12H  °• zinc sulfate  220 mg Oral Daily  ° °Continuous Infusions: °• sodium chloride    °• sodium chloride 1 mL/kg/hr (09/17/19 0531)  °• heparin 700 Units/hr (09/16/19 0651)  ° °PRN Meds: °sodium chloride, acetaminophen **OR** acetaminophen, chlorpheniramine-HYDROcodone, guaiFENesin-dextromethorphan, Ipratropium-Albuterol, ondansetron **OR** ondansetron (ZOFRAN) IV, sodium chloride flush  ° °Vital Signs  ° °Vitals:  ° 09/16/19 1958 09/16/19 2356 09/17/19 0520 09/17/19 0741  °BP: (!) 98/57 (!) 94/58 (!) 85/54 (!) 80/65  °Pulse: (!) 104 99 98 86  °Resp: 17 15 15 (!) 22  °Temp: 98.1 °F (36.7 °C) 98 °F (36.7 °C) 99.1 °F (37.3 °C) 98.1 °F (36.7 °C)  °TempSrc: Oral Oral Oral Oral  °SpO2: 98% 99% 100% 92%  °Weight:      °Height:      ° ° °Intake/Output Summary (Last 24 hours) at 09/17/2019 0755 °Last data filed at 09/16/2019 2359 °Gross per 24 hour  °Intake 271.87 ml  °Output --  °Net 271.87 ml  ° °Last 3 Weights 09/14/2019 09/13/2019 08/01/2018  °Weight (lbs) 115 lb 4.8 oz 117 lb 113 lb 8 oz  °Weight (kg) 52.3 kg 53.071 kg 51.483 kg  °   ° °Telemetry  °SR  ° °ECG  ° ° °Physical Exam  ° °Vitals:  ° 09/16/19 1958 09/16/19 2356 09/17/19 0520 09/17/19 0741  °BP: (!) 98/57 (!) 94/58 (!) 85/54 (!) 80/65  °Pulse: (!) 104 99 98 86  °Resp: 17 15 15 (!) 22  °Temp: 98.1 °F (36.7 °C) 98 °F (36.7 °C) 99.1 °F  (37.3 °C) 98.1 °F (36.7 °C)  °TempSrc: Oral Oral Oral Oral  °SpO2: 98% 99% 100% 92%  °Weight:      °Height:      °  ° °Intake/Output Summary (Last 24 hours) at 09/17/2019 0755 °Last data filed at 09/16/2019 2359 °Gross per 24 hour  °Intake 271.87 ml  °Output --  °Net 271.87 ml  °  °Last 3 Weights 09/14/2019 09/13/2019 08/01/2018  °Weight (lbs) 115 lb 4.8 oz 117 lb 113 lb 8 oz  °Weight (kg) 52.3 kg 53.071 kg 51.483 kg  °  Body mass index is 22.52 kg/m².  ° °General: Well nourished, well developed, in no acute distress °Head: Atraumatic, normal size  °Eyes: PEERLA, EOMI  °Neck:  °Cardiac:  RRR  No murmurs  No rubs   °Lungs: Clear to auscultation bilaterally, no wheezing, rhonchi or rales  °Abd: Soft, nontender, no hepatomegaly  °Ext: No edema, pulses 2+ °Neuro: Alert and oriented to person, place, time, and situation, CNII-XII grossly intact, no focal deficits  °Psych: Normal mood and affect  ° °Labs  °High Sensitivity Troponin:   °Recent Labs  °Lab 09/14/19 °1645 09/15/19 °1156 09/15/19 °1624 09/16/19 °0448 09/16/19 °0926  °TROPONINIHS   688* 2,226* 2,723* 2,497* 2,368*  °   °Cardiac EnzymesNo results for input(s): TROPONINI in the last 168 hours. No results for input(s): TROPIPOC in the last 168 hours.  °Chemistry °Recent Labs  °Lab 09/15/19 °1624 09/16/19 °0448 09/17/19 °0249  °NA 136 136 137  °K 4.6 3.9 3.9  °CL 106 107 107  °CO2 21* 23 23  °GLUCOSE 99 95 96  °BUN 6 6 5*  °CREATININE 0.66 0.68 0.71  °CALCIUM 8.1* 8.1* 7.9*  °PROT 5.9* 5.5* 5.5*  °ALBUMIN 3.2* 3.0* 3.0*  °AST 24 21 21  °ALT 12 10 14  °ALKPHOS 45 43 43  °BILITOT 0.5 0.5 0.5  °GFRNONAA >60 >60 >60  °GFRAA >60 >60 >60  °ANIONGAP 9 6 7  °  °Hematology °Recent Labs  °Lab 09/15/19 °1624 09/16/19 °0448 09/17/19 °0249  °WBC 12.4* 10.4 11.3*  °RBC 4.73 4.48 4.29  °HGB 12.7 12.0 11.6*  °HCT 40.0 38.3 36.9  °MCV 84.6 85.5 86.0  °MCH 26.8 26.8 27.0  °MCHC 31.8 31.3 31.4  °RDW 15.7* 15.9* 15.9*  °PLT 231 211 210  ° °BNP °Recent Labs  °Lab 09/13/19 °1902  °BNP 43.0  °   °DDimer  °Recent Labs  °Lab 09/14/19 °0115 09/16/19 °0448 09/17/19 °0249  °DDIMER <0.27 <0.27 0.30  °  ° °Radiology  °DG Chest Port 1 View ° °Result Date: 09/16/2019 °CLINICAL DATA:  Dyspnea, COVID-19 positive EXAM: PORTABLE CHEST 1 VIEW COMPARISON:  09/13/2019 chest radiograph. FINDINGS: Stable cardiomediastinal silhouette with normal heart size. No pneumothorax. No pleural effusion. Lungs appear clear, with no acute consolidative airspace disease and no pulmonary edema. IMPRESSION: No active disease. Electronically Signed   By: Jason A Poff M.D.   On: 09/16/2019 10:39  ° ° °Cardiac Studies  °TTE 09/14/2019 ° 1. Left ventricular ejection fraction, by estimation, is 60 to 65%. The  °left ventricle has normal function. The left ventricle has no regional  °wall motion abnormalities. Left ventricular diastolic parameters were  °normal.  ° 2. Right ventricular systolic function is normal. The right ventricular  °size is normal.  ° 3. The mitral valve is normal in structure. Trivial mitral valve  °regurgitation.  ° 4. The aortic valve is tricuspid. Aortic valve regurgitation is not  °visualized. No aortic stenosis is present.  ° 5. The inferior vena cava is small suggestive of low RAP.  ° °Patient Profile  °Madeline Davis is a 28 y.o. female with asthma who was admitted 4/17 with covid URI symptoms and symptoms of CP with exertion, elevated troponin concerning for NSTEMI.  ° °Assessment & Plan  ° °1. Chest pain/NSTEMI °-sPt presents with CP that is new   Pain with activity but not positional or pleuritic °EKG with relatively diffuse ST elevation    Exam without a rub. Echo at [facility] without WMA and normal LVEF.  Troponin peaked 2723 and trending down   ° °Although most likely myocarditis not completely typical for myopericarditis °Agree with assessment of W ONeill yesterday    °Would recomm L heart cath to confirm anatomy °Pt understands and agrees to proceed.   ° ° °  Shjon Lizarraga MD °09/17/2019 ° ° °

## 2019-09-17 NOTE — H&P (View-Only) (Signed)
Cardiology Progress Note  Patient ID: Madeline Davis MRN: [id number] DOB: 02/27/1991 Date of Encounter: 09/17/2019  Primary Cardiologist: Reatha Harps, MD  Subjective   Pt complains of CP with moving /activity   Not just with sitting up or laying flat   Not pleuritic   Breathing is OK    Inpatient Medications  Scheduled Meds:  vitamin C  500 mg Oral Daily   aspirin  81 mg Oral Daily   atorvastatin  80 mg Oral Daily   fluticasone  2 spray Each Nare Daily   loratadine  10 mg Oral Daily   metoprolol tartrate  25 mg Oral BID   potassium chloride SA  40 mEq Oral Once   sodium chloride flush  3 mL Intravenous Q12H   zinc sulfate  220 mg Oral Daily   Continuous Infusions:  sodium chloride     sodium chloride 1 mL/kg/hr (09/17/19 0531)   heparin 700 Units/hr (09/16/19 0651)   PRN Meds: sodium chloride, acetaminophen **OR** acetaminophen, chlorpheniramine-HYDROcodone, guaiFENesin-dextromethorphan, Ipratropium-Albuterol, ondansetron **OR** ondansetron (ZOFRAN) IV, sodium chloride flush   Vital Signs   Vitals:   09/16/19 1958 09/16/19 2356 09/17/19 0520 09/17/19 0741  BP: (!) 98/57 (!) 94/58 (!) 85/54 (!) 80/65  Pulse: (!) 104 99 98 86  Resp: 17 15 15  (!) 22  Temp: 98.1 F (36.7 C) 98 F (36.7 C) 99.1 F (37.3 C) 98.1 F (36.7 C)  TempSrc: Oral Oral Oral Oral  SpO2: 98% 99% 100% 92%  Weight:      Height:        Intake/Output Summary (Last 24 hours) at 09/17/2019 0755 Last data filed at 09/16/2019 2359 Gross per 24 hour  Intake 271.87 ml  Output --  Net 271.87 ml   Last 3 Weights 09/14/2019 09/13/2019 08/01/2018  Weight (lbs) 115 lb 4.8 oz 117 lb 113 lb 8 oz  Weight (kg) 52.3 kg 53.071 kg 51.483 kg      Telemetry  SR   ECG    Physical Exam   Vitals:   09/16/19 1958 09/16/19 2356 09/17/19 0520 09/17/19 0741  BP: (!) 98/57 (!) 94/58 (!) 85/54 (!) 80/65  Pulse: (!) 104 99 98 86  Resp: 17 15 15  (!) 22  Temp: 98.1 F (36.7 C) 98 F (36.7 C) 99.1 F  (37.3 C) 98.1 F (36.7 C)  TempSrc: Oral Oral Oral Oral  SpO2: 98% 99% 100% 92%  Weight:      Height:         Intake/Output Summary (Last 24 hours) at 09/17/2019 0755 Last data filed at 09/16/2019 2359 Gross per 24 hour  Intake 271.87 ml  Output --  Net 271.87 ml    Last 3 Weights 09/14/2019 09/13/2019 08/01/2018  Weight (lbs) 115 lb 4.8 oz 117 lb 113 lb 8 oz  Weight (kg) 52.3 kg 53.071 kg 51.483 kg    Body mass index is 22.52 kg/m.   General: Well nourished, well developed, in no acute distress Head: Atraumatic, normal size  Eyes: PEERLA, EOMI  Neck:  Cardiac:  RRR  No murmurs  No rubs   Lungs: Clear to auscultation bilaterally, no wheezing, rhonchi or rales  Abd: Soft, nontender, no hepatomegaly  Ext: No edema, pulses 2+ Neuro: Alert and oriented to person, place, time, and situation, CNII-XII grossly intact, no focal deficits  Psych: Normal mood and affect   Labs  High Sensitivity Troponin:   Recent Labs  Lab 09/14/19 1645 09/15/19 1156 09/15/19 1624 09/16/19 0448 09/16/19 0926  TROPONINIHS  688* 2,226* 2,723* 2,497* 2,368*     Cardiac EnzymesNo results for input(s): TROPONINI in the last 168 hours. No results for input(s): TROPIPOC in the last 168 hours.  Chemistry Recent Labs  Lab 09/15/19 1624 09/16/19 0448 09/17/19 0249  NA 136 136 137  K 4.6 3.9 3.9  CL 106 107 107  CO2 21* 23 23  GLUCOSE 99 95 96  BUN 6 6 5*  CREATININE 0.66 0.68 0.71  CALCIUM 8.1* 8.1* 7.9*  PROT 5.9* 5.5* 5.5*  ALBUMIN 3.2* 3.0* 3.0*  AST 24 21 21   ALT 12 10 14   ALKPHOS 45 43 43  BILITOT 0.5 0.5 0.5  GFRNONAA >60 >60 >60  GFRAA >60 >60 >60  ANIONGAP 9 6 7     Hematology Recent Labs  Lab 09/15/19 1624 09/16/19 0448 09/17/19 0249  WBC 12.4* 10.4 11.3*  RBC 4.73 4.48 4.29  HGB 12.7 12.0 11.6*  HCT 40.0 38.3 36.9  MCV 84.6 85.5 86.0  MCH 26.8 26.8 27.0  MCHC 31.8 31.3 31.4  RDW 15.7* 15.9* 15.9*  PLT 231 211 210   BNP Recent Labs  Lab 09/13/19 1902  BNP 43.0     DDimer  Recent Labs  Lab 09/14/19 0115 09/16/19 0448 09/17/19 0249  DDIMER <0.27 <0.27 0.30     Radiology  DG Chest Port 1 View  Result Date: 09/16/2019 CLINICAL DATA:  Dyspnea, COVID-19 positive EXAM: PORTABLE CHEST 1 VIEW COMPARISON:  09/13/2019 chest radiograph. FINDINGS: Stable cardiomediastinal silhouette with normal heart size. No pneumothorax. No pleural effusion. Lungs appear clear, with no acute consolidative airspace disease and no pulmonary edema. IMPRESSION: No active disease. Electronically Signed   By: Ilona Sorrel M.D.   On: 09/16/2019 10:39    Cardiac Studies  TTE 09/14/2019 1. Left ventricular ejection fraction, by estimation, is 60 to 65%. The  left ventricle has normal function. The left ventricle has no regional  wall motion abnormalities. Left ventricular diastolic parameters were  normal.  2. Right ventricular systolic function is normal. The right ventricular  size is normal.  3. The mitral valve is normal in structure. Trivial mitral valve  regurgitation.  4. The aortic valve is tricuspid. Aortic valve regurgitation is not  visualized. No aortic stenosis is present.  5. The inferior vena cava is small suggestive of low RAP.   Patient Profile  Madeline Davis is a 30 y.o. female with asthma who was admitted 4/17 with covid URI symptoms and symptoms of CP with exertion, elevated troponin concerning for NSTEMI.   Assessment & Plan   1. Chest pain/NSTEMI -sPt presents with CP that is new   Pain with activity but not positional or pleuritic EKG with relatively diffuse ST elevation    Exam without a rub. Echo at [facility] without WMA and normal LVEF.  Troponin peaked 2723 and trending down    Although most likely myocarditis not completely typical for myopericarditis Agree with assessment of W ONeill yesterday    Would recomm L heart cath to confirm anatomy Pt understands and agrees to proceed.       Dorris Carnes MD 09/17/2019

## 2019-09-17 NOTE — Progress Notes (Signed)
ANTICOAGULATION CONSULT NOTE - Follow Up Consult  Pharmacy Consult for Heparin Indication: chest pain/ACS  No Known Allergies  Patient Measurements: Height: 5' (152.4 cm) Weight: 52.3 kg (115 lb 4.8 oz) IBW/kg (Calculated) : 45.5 Heparin Dosing Weight: 52.3 kg  Vital Signs: Temp: 98.8 F (37.1 C) (04/19 1200) Temp Source: Oral (04/19 1200) BP: 98/56 (04/19 1200) Pulse Rate: 86 (04/19 0741)  Labs: Recent Labs    09/15/19 1624 09/15/19 1624 09/16/19 0448 09/16/19 0448 09/16/19 0926 09/16/19 1246 09/17/19 0249 09/17/19 0834 09/17/19 1046  HGB 12.7   < > 12.0  --   --   --  11.6*  --   --   HCT 40.0  --  38.3  --   --   --  36.9  --   --   PLT 231  --  211  --   --   --  210  --   --   HEPARINUNFRC 0.55   < > 0.25*  --   --  0.49 0.36  --   --   CREATININE 0.66  --  0.68  --   --   --  0.71  --   --   TROPONINIHS 2,723*   < > 2,497*   < > 2,368*  --   --  1,335* 1,408*   < > = values in this interval not displayed.    Estimated Creatinine Clearance: 74.5 mL/min (by C-G formula based on SCr of 0.71 mg/dL).  Assessment:  29 yo female with chest discomfort and elevated troponin in the setting of Covid infection.  Pharmacy consulted to initiate heparin for ACS on 4/17. Received 1 dose of enoxaparin 40 mg SQ on 4/16.  Not on any anticoagulants PTA.   Heparin level remains therapeutic (0.36) on 700 units/hr.  Hgb trended down some, no bleeding reported. Cardiac cath planned for today.  Goal of Therapy:  Heparin level 0.3-0.7 units/ml Monitor platelets by anticoagulation protocol: Yes   Plan:   Continue heparin drip at 700 units/hr.  Daily heparin level and CBC while on heparin.  Follow up post-cath.  Dennie Fetters, [place] Phone: [phone] 09/17/2019,12:52 PM

## 2019-09-18 ENCOUNTER — Telehealth: Payer: Self-pay | Admitting: Cardiovascular Disease

## 2019-09-18 DIAGNOSIS — I249 Acute ischemic heart disease, unspecified: Secondary | ICD-10-CM

## 2019-09-18 LAB — MAGNESIUM: Magnesium: 1.7 mg/dL (ref 1.7–2.4)

## 2019-09-18 LAB — COMPREHENSIVE METABOLIC PANEL
ALT: 24 U/L (ref 0–44)
AST: 29 U/L (ref 15–41)
Albumin: 2.7 g/dL — ABNORMAL LOW (ref 3.5–5.0)
Alkaline Phosphatase: 43 U/L (ref 38–126)
Anion gap: 7 (ref 5–15)
BUN: 5 mg/dL — ABNORMAL LOW (ref 6–20)
CO2: 24 mmol/L (ref 22–32)
Calcium: 7.7 mg/dL — ABNORMAL LOW (ref 8.9–10.3)
Chloride: 108 mmol/L (ref 98–111)
Creatinine, Ser: 0.67 mg/dL (ref 0.44–1.00)
GFR calc Af Amer: >60 mL/min (ref >60)
GFR calc non Af Amer: >60 mL/min (ref >60)
Glucose, Bld: 85 mg/dL (ref 70–99)
Potassium: 3.7 mmol/L (ref 3.5–5.1)
Sodium: 139 mmol/L (ref 135–145)
Total Bilirubin: 0.4 mg/dL (ref 0.3–1.2)
Total Protein: 5 g/dL — ABNORMAL LOW (ref 6.5–8.1)

## 2019-09-18 LAB — C-REACTIVE PROTEIN: CRP: 0.7 mg/dL (ref <1.0)

## 2019-09-18 LAB — CBC WITH DIFFERENTIAL/PLATELET
Abs Immature Granulocytes: 0.03 10*3/uL (ref 0.00–0.07)
Basophils Absolute: 0 10*3/uL (ref 0.0–0.1)
Basophils Relative: 0 %
Eosinophils Absolute: 0.3 10*3/uL (ref 0.0–0.5)
Eosinophils Relative: 5 %
HCT: 32.5 % — ABNORMAL LOW (ref 36.0–46.0)
Hemoglobin: 10 g/dL — ABNORMAL LOW (ref 12.0–15.0)
Immature Granulocytes: 1 %
Lymphocytes Relative: 31 %
Lymphs Abs: 1.8 10*3/uL (ref 0.7–4.0)
MCH: 26.4 pg (ref 26.0–34.0)
MCHC: 30.8 g/dL (ref 30.0–36.0)
MCV: 85.8 fL (ref 80.0–100.0)
Monocytes Absolute: 0.5 10*3/uL (ref 0.1–1.0)
Monocytes Relative: 9 %
Neutro Abs: 3.1 10*3/uL (ref 1.7–7.7)
Neutrophils Relative %: 54 %
Platelets: 219 10*3/uL (ref 150–400)
RBC: 3.79 MIL/uL — ABNORMAL LOW (ref 3.87–5.11)
RDW: 16.1 % — ABNORMAL HIGH (ref 11.5–15.5)
WBC: 5.6 10*3/uL (ref 4.0–10.5)
nRBC: 0 % (ref 0.0–0.2)

## 2019-09-18 LAB — D-DIMER, QUANTITATIVE: D-Dimer, Quant: 0.4 ug/mL-FEU (ref 0.00–0.50)

## 2019-09-18 LAB — FERRITIN: Ferritin: 18 ng/mL (ref 11–307)

## 2019-09-18 MED ORDER — COLCHICINE 0.6 MG PO TABS
0.6000 mg | ORAL_TABLET | Freq: Every day | ORAL | Status: DC
  Administered 2019-09-18 – 2019-09-19 (×2): 0.6 mg via ORAL
  Filled 2019-09-18 (×2): qty 1

## 2019-09-18 MED ORDER — IBUPROFEN 400 MG PO TABS
400.0000 mg | ORAL_TABLET | Freq: Three times a day (TID) | ORAL | Status: DC
  Administered 2019-09-18 – 2019-09-19 (×4): 400 mg via ORAL
  Filled 2019-09-18 (×3): qty 1
  Filled 2019-09-18: qty 2

## 2019-09-18 MED ORDER — PANTOPRAZOLE SODIUM 40 MG PO TBEC
40.0000 mg | DELAYED_RELEASE_TABLET | Freq: Every day | ORAL | Status: DC
  Administered 2019-09-18 – 2019-09-19 (×2): 40 mg via ORAL
  Filled 2019-09-18 (×2): qty 1

## 2019-09-18 NOTE — Progress Notes (Signed)
PROGRESS NOTE                                                                                                                                                                                                             Patient Demographics:    Madeline Davis, is a 29 y.o. female, DOB - 1990/12/09, PIR:[id number]  Outpatient Primary MD for the patient is Patient, No Pcp Per   Admit date - 09/13/2019   LOS - 3  Chief Complaint  Patient presents with  . Cough       Brief Narrative: Patient is a 29 y.o. female with PMHx of bronchial asthma-who was diagnosed with COVID-19 on admission-presented to the ED at [facility] with exertional chest pain and dyspnea along with palpitations.  For the past 1 week prior to this hospitalization-patient was having URI/allergy-like symptoms.  Significant Events: 4/15>> admit to [facility] for exertional chest pain and shortness of breath 4/15>> CTA chest negative for PE 4/16>> echo-EF 60-65%, no wall motion regional abnormalities 4/19>> Limited echo-EF 60-65% no regional wall motion abnormalities 4/19>> LHC-no angiographically significant CAD  COVID-19 medications: Steroids: Not started Remdesivir: Not started  Antibiotics: None  Microbiology data: None  DVT prophylaxis: Lovenox  Procedures: 4/19>> LHC-no angiographically significant CAD  Consults: Cardiology    Subjective:   Continues to have chest pain and shortness of breath along with tachycardia with minimal exertion   Assessment  & Plan :   Chest pain/exertional dyspnea with elevated troponin: Initially considered to be non-STEMI-however LHC negative for significant CAD-now thought to have myopericarditis secondary to COVID-19.  No longer on IV heparin/statin-cardiology following-she continues to have chest pain/shortness of breath with minimal ambulation-has been started on NSAIDs and colchicine.  Continue telemetry  monitoring.  COVID-19 infection: Without pneumonia on imaging studies-CRP not significantly elevated.  Acknowledges URI/allergic-like symptoms for the past 1 week.  Do not think she requires Remdesivir steroid at this point.  Supportive care with Claritin and Flonase for now.  Follow clinical trajectory and inflammatory markers.  Fever: afebrile  O2 requirements:  SpO2: 98 % O2 Flow Rate (L/min): 2 L/min   COVID-19 Labs: Recent Labs    09/16/19 0448 09/17/19 0249 09/18/19 0431  DDIMER <0.27 0.30 0.40  FERRITIN 17 17 18   CRP 0.7 0.7 0.7       Component Value Date/Time  BNP 43.0 09/13/2019 1902    No results for input(s): PROCALCITON in the last 168 hours.  Lab Results  Component Value Date   SARSCOV2NAA POSITIVE (A) 09/14/2019    Prone/Incentive Spirometry: encouraged  incentive spirometry use 3-4/hour.  Bronchial asthma: No signs of exacerbation-continue bronchodilators. ABG:    Component Value Date/Time   TCO2 25 08/10/2011 0553    Vent Settings: N/A  Condition - Stable  Family Communication  : Patient will update family herself-I have asked her to let me know if family members have questions for [place].  Code Status :  Full Code  Diet :  Diet Order            Diet regular Room service appropriate? Yes; Fluid consistency: Thin  Diet effective now               Disposition Plan  : Home when medically stable  Barriers to discharge: Ongoing chest pain/shortness of breath secondary to myopericarditis.  Antimicorbials  :    Anti-infectives (From admission, onward)   None      Inpatient Medications  Scheduled Meds: . vitamin C  500 mg Oral Daily  . aspirin  81 mg Oral Daily  . colchicine  0.6 mg Oral Daily  . enoxaparin (LOVENOX) injection  40 mg Subcutaneous Q24H  . fluticasone  2 spray Each Nare Daily  . ibuprofen  400 mg Oral Q8H  . loratadine  10 mg Oral Daily  . metoprolol tartrate  25 mg Oral BID  . pantoprazole  40 mg Oral Q1200  .  sodium chloride flush  3 mL Intravenous Q12H  . sodium chloride flush  3 mL Intravenous Q12H  . zinc sulfate  220 mg Oral Daily   Continuous Infusions: . sodium chloride     PRN Meds:.sodium chloride, acetaminophen **OR** acetaminophen, chlorpheniramine-HYDROcodone, guaiFENesin-dextromethorphan, Ipratropium-Albuterol, ondansetron **OR** ondansetron (ZOFRAN) IV, sodium chloride flush   Time Spent in minutes  25  See all Orders from today for further details   Jeoffrey Massed M.D on 09/18/2019 at 11:58 AM  To page go to [email or web address] - use universal password  [facility] -  Office  [phone]    Objective:   Vitals:   09/17/19 2151 09/17/19 2251 09/18/19 0500 09/18/19 1047  BP: (!) 95/49 (!) 89/46 (!) 81/51 (!) 92/53  Pulse: 99 93 90 89  Resp:      Temp:   98.2 F (36.8 C)   TempSrc:   Oral   SpO2: 100% 100% 98%   Weight:      Height:        Wt Readings from Last 3 Encounters:  09/14/19 52.3 kg  08/01/18 51.5 kg  09/21/17 52.2 kg     Intake/Output Summary (Last 24 hours) at 09/18/2019 1158 Last data filed at 09/18/2019 1051 Gross per 24 hour  Intake 1046.23 ml  Output --  Net 1046.23 ml     Physical Exam Gen Exam:Alert awake-not in any distress HEENT:atraumatic, normocephalic Chest: B/L clear to auscultation anteriorly CVS:S1S2 regular Abdomen:soft non tender, non distended Extremities:no edema Neurology: Non focal Skin: no rash   Data Review:    CBC Recent Labs  Lab 09/13/19 1902 09/15/19 1624 09/16/19 0448 09/17/19 0249 09/18/19 0431  WBC 7.6 12.4* 10.4 11.3* 5.6  HGB 13.7 12.7 12.0 11.6* 10.0*  HCT 43.7 40.0 38.3 36.9 32.5*  PLT 288 231 211 210 219  MCV 85.4 84.6 85.5 86.0 85.8  MCH 26.8 26.8 26.8 27.0 26.4  MCHC 31.4 31.8 31.3  31.4 30.8  RDW 15.4 15.7* 15.9* 15.9* 16.1*  LYMPHSABS 1.6  --  3.5 2.9 1.8  MONOABS 0.6  --  0.7 0.6 0.5  EOSABS 0.2  --  0.3 0.3 0.3  BASOSABS 0.0  --  0.0 0.0 0.0    Chemistries  Recent Labs   Lab 09/13/19 1902 09/13/19 2344 09/15/19 1624 09/16/19 0448 09/17/19 0249 09/18/19 0431  NA 134*  --  136 136 137 139  K 3.4*  --  4.6 3.9 3.9 3.7  CL 102  --  106 107 107 108  CO2 22  --  21* 23 23 24   GLUCOSE 91  --  99 95 96 85  BUN 8  --  6 6 5* 5*  CREATININE 0.70  --  0.66 0.68 0.71 0.67  CALCIUM 8.1*  --  8.1* 8.1* 7.9* 7.7*  MG  --  1.8 1.8 1.8 1.9 1.7  AST  --   --  24 21 21 29   ALT  --   --  12 10 14 24   ALKPHOS  --   --  45 43 43 43  BILITOT  --   --  0.5 0.5 0.5 0.4   ------------------------------------------------------------------------------------------------------------------ Recent Labs    09/16/19 0448  CHOL 150  HDL 21*  LDLCALC 101*  TRIG 139  CHOLHDL 7.1    Lab Results  Component Value Date   HGBA1C 5.4 09/15/2019   ------------------------------------------------------------------------------------------------------------------ No results for input(s): TSH, T4TOTAL, T3FREE, THYROIDAB in the last 72 hours.  Invalid input(s): FREET3 ------------------------------------------------------------------------------------------------------------------ Recent Labs    09/17/19 0249 09/18/19 0431  FERRITIN 17 18    Coagulation profile No results for input(s): INR, PROTIME in the last 168 hours.  Recent Labs    09/17/19 0249 09/18/19 0431  DDIMER 0.30 0.40    Cardiac Enzymes No results for input(s): CKMB, TROPONINI, MYOGLOBIN in the last 168 hours.  Invalid input(s): CK ------------------------------------------------------------------------------------------------------------------    Component Value Date/Time   BNP 43.0 09/13/2019 1902    Micro Results Recent Results (from the past 240 hour(s))  Respiratory Panel by RT PCR (Flu A&B, Covid) - Nasopharyngeal Swab     Status: Abnormal   Collection Time: 09/14/19 12:16 AM   Specimen: Nasopharyngeal Swab  Result Value Ref Range Status   SARS Coronavirus 2 by RT PCR POSITIVE (A)  NEGATIVE Final    Comment: RESULT CALLED TO, READ BACK BY AND VERIFIED WITH: M DOSS,RN@[id number]  09/14/19 MKELLY (NOTE) SARS-CoV-2 target nucleic acids are DETECTED. SARS-CoV-2 RNA is generally detectable in upper respiratory specimens  during the acute phase of infection. Positive results are indicative of the presence of the identified virus, but do not rule out bacterial infection or co-infection with other pathogens not detected by the test. Clinical correlation with patient history and other diagnostic information is necessary to determine patient infection status. The expected result is Negative. Fact Sheet for Patients:  PinkCheek.be Fact Sheet for Healthcare Providers: GravelBags.it This test is not yet approved or cleared by the [place] FDA and  has been authorized for detection and/or diagnosis of SARS-CoV-2 by FDA under an Emergency Use Authorization (EUA).  This EUA will remain in effect (meaning this test can be used) for th e duration of  the COVID-19 declaration under Section 564(b)(1) of the Act, 21 U.S.C. section 360bbb-3(b)(1), unless the authorization is terminated or revoked sooner.    Influenza A by PCR NEGATIVE NEGATIVE Final   Influenza B by PCR NEGATIVE NEGATIVE Final    Comment: (NOTE)  The Xpert Xpress SARS-CoV-2/FLU/RSV assay is intended as an aid in  the diagnosis of influenza from Nasopharyngeal swab specimens and  should not be used as a sole basis for treatment. Nasal washings and  aspirates are unacceptable for Xpert Xpress SARS-CoV-2/FLU/RSV  testing. Fact Sheet for Patients: https://www.moore.com/ Fact Sheet for Healthcare Providers: https://www.young.biz/ This test is not yet approved or cleared by the [place] FDA and  has been authorized for detection and/or diagnosis of SARS-CoV-2 by  FDA under an Emergency Use Authorization (EUA). This EUA  will remain  in effect (meaning this test can be used) for the duration of the  Covid-19 declaration under Section 564(b)(1) of the Act, 21  U.S.C. section 360bbb-3(b)(1), unless the authorization is  terminated or revoked. Performed at [facility], [address] [place], [place] [address]     Radiology Reports CT Angio Chest PE W/Cm &/Or Wo Cm  Result Date: 09/13/2019 CLINICAL DATA:  Shortness of breath EXAM: CT ANGIOGRAPHY CHEST WITH CONTRAST TECHNIQUE: Multidetector CT imaging of the chest was performed using the standard protocol during bolus administration of intravenous contrast. Multiplanar CT image reconstructions and MIPs were obtained to evaluate the vascular anatomy. CONTRAST:  OMNIPAQUE IOHEXOL 350 MG/ML SOLN COMPARISON:  Radiograph 09/13/2019 FINDINGS: Cardiovascular: Satisfactory opacification the pulmonary arteries to the segmental level. No pulmonary artery filling defects are identified. Central pulmonary arteries are normal caliber. Normal heart size. No pericardial effusion. The aorta is normal caliber. Normal 3 vessel branching of the aortic arch. Proximal great vessels are unremarkable. 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 No mediastinal fluid or gas. Normal thyroid gland and thoracic inlet. No acute abnormality of the trachea or esophagus. No worrisome mediastinal, hilar or axillary adenopathy. Lungs/Pleura: No consolidation, features of edema, pneumothorax, or effusion. No suspicious pulmonary nodules or masses. The Upper Abdomen: No acute abnormalities present in the visualized portions of the upper abdomen. Musculoskeletal: No acute osseous abnormality or suspicious osseous lesion. Bilateral metallic nipple ornamentation. No worrisome chest wall lesions or mass. Review of the MIP images confirms the above findings.  IMPRESSION: 1. No evidence of pulmonary embolism or other acute cardiopulmonary process. 2. 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 Electronically Signed   By: Kreg Shropshire M.D.   On: 09/13/2019 22:44   CARDIAC CATHETERIZATION  Result Date: 09/17/2019 Conclusions: 1. No angiographically significant coronary artery disease. 2. Normal left ventricular filling pressure. Recommendations: 1. Ongoing management of COVID-19 and likely myopericarditis contributing to chest pain and elevated troponin. 2. Primary prevention of coronary artery disease. [name], MD [facility]   DG Chest Port 1 View  Result Date: 09/16/2019 CLINICAL DATA:  Dyspnea, COVID-19 positive EXAM: PORTABLE CHEST 1 VIEW COMPARISON:  09/13/2019 chest radiograph. FINDINGS: Stable cardiomediastinal silhouette with normal heart size. No pneumothorax. No pleural effusion. Lungs appear clear, with no acute consolidative airspace disease and no pulmonary edema. IMPRESSION: No active disease. Electronically Signed   By: Delbert Phenix M.D.   On: 09/16/2019 10:39   DG Chest Port 1 View  Result Date: 09/13/2019 CLINICAL DATA:  Shortness of breath, dyspnea EXAM: PORTABLE CHEST 1 VIEW COMPARISON:  08/03/2015 FINDINGS: The heart size and mediastinal contours are within normal limits. Both lungs are clear. The visualized skeletal structures are unremarkable. IMPRESSION: No acute abnormality of  the lungs in AP portable projection. Electronically Signed   By: Lauralyn PrimesAlex  Bibbey M.D.   On: 09/13/2019 19:10   ECHOCARDIOGRAM COMPLETE  Result Date: 09/14/2019    ECHOCARDIOGRAM REPORT   [facility] Date of Exam: 09/14/2019 Medical Rec #:  [id number]   Height:       60.0 in Accession #:    [id number]  Weight:       117.0 lb Date of Birth:  March 29, 1991   BSA:          1.486 m Patient Age:    28 years    BP:            112/76 mmHg Patient Gender: F           HR:           92 bpm. [facility] Procedure: 2D Echo Indications:    Elevated Troponin  History:        Patient has no prior history of Echocardiogram examinations.                 Risk Factors:Current Smoker. COVID-19 virus infection, Elevated                 troponin.  Sonographer:    Jeryl ColumbiaJohanna Elliott RDCS (AE) Referring Phys: [id number] DAVID MANUEL ORTIZ IMPRESSIONS  1. Left ventricular ejection fraction, by estimation, is 60 to 65%. The left ventricle has normal function. The left ventricle has no regional wall motion abnormalities. Left ventricular diastolic parameters were normal.  2. Right ventricular systolic function is normal. The right ventricular size is normal.  3. The mitral valve is normal in structure. Trivial mitral valve regurgitation.  4. The aortic valve is tricuspid. Aortic valve regurgitation is not visualized. No aortic stenosis is present.  5. The inferior vena cava is small suggestive of low RAP. FINDINGS  Left Ventricle: Left ventricular ejection fraction, by estimation, is 60 to 65%. The left ventricle has normal function. The left ventricle has no regional wall motion abnormalities. The left ventricular internal cavity size was normal in size. There is  no left ventricular hypertrophy. Left ventricular diastolic parameters were normal. Right Ventricle: The right ventricular size is normal. No increase in right ventricular wall thickness. Right ventricular systolic function is normal. Left Atrium: Left atrial size was normal in size. Right Atrium: Right atrial size was normal in size. Pericardium: There is no evidence of pericardial effusion. Mitral Valve: The mitral valve is normal in structure. Trivial mitral valve regurgitation. Tricuspid Valve: The tricuspid valve is normal in structure. Tricuspid valve regurgitation is not demonstrated. Aortic Valve: The aortic valve is tricuspid. Aortic valve regurgitation is not visualized. No  aortic stenosis is present. Pulmonic Valve: The pulmonic valve was not well visualized. Pulmonic valve regurgitation is not visualized. Aorta: The aortic root is normal in size and structure. Venous: The inferior vena cava is small suggestive of low RAP. IAS/Shunts: No atrial level shunt detected by color flow Doppler.  LEFT VENTRICLE PLAX 2D LVIDd:         4.06 cm Diastology LVIDs:         2.66 cm LV e' lateral:   10.10 cm/s LV PW:         1.20 cm LV E/e' lateral: 6.9 LV IVS:        0.90 cm LV e' medial:    11.50 cm/s  LV E/e' medial:  6.0  RIGHT VENTRICLE RV S prime:     10.90 cm/s TAPSE (M-mode): 1.7 cm LEFT ATRIUM           Index LA diam:      2.30 cm 1.55 cm/m LA Vol (A4C): 29.0 ml 19.51 ml/m   AORTA Ao Root diam: 2.20 cm MITRAL VALVE MV Area (PHT): 3.65 cm MV Decel Time: 208 msec MV E velocity: 69.40 cm/s MV A velocity: 44.60 cm/s MV E/A ratio:  1.56 Prentice Docker MD Electronically signed by Prentice Docker MD Signature Date/Time: 09/14/2019/2:58:26 PM    Final    ECHOCARDIOGRAM LIMITED  Result Date: 09/17/2019    ECHOCARDIOGRAM LIMITED REPORT   Patient Name:   Madeline Davis [facility] Date of Exam: 09/17/2019 Medical Rec #:  [id number]   Height:       60.0 in Accession #:    [id number]  Weight:       115.3 lb Date of Birth:  1990-06-05   BSA:          1.477 m Patient Age:    28 years    BP:           98/56 mmHg Patient Gender: F           HR:           93 bpm. Exam Location:  Inpatient Procedure: Limited Echo Indications:    NSTEMI I21.4  History:        Patient has prior history of Echocardiogram examinations, most                 recent 09/14/2019. Risk Factors:Current Smoker. Elevated                 troponin.  Sonographer:    Renella Cunas RDCS Referring Phys: [id number] [facility]  Sonographer Comments: Covid positive. IMPRESSIONS  1. Left ventricular ejection fraction, by estimation, is 60 to 65%. The left ventricle has normal function. The left ventricle has no regional wall  motion abnormalities.  2. Right ventricular systolic function is normal. The right ventricular size is normal.  3. The mitral valve is normal in structure.  4. The aortic valve is normal in structure. Comparison(s): Prior images reviewed side by side. The left ventricular function is unchanged. FINDINGS  Left Ventricle: Left ventricular ejection fraction, by estimation, is 60 to 65%. The left ventricle has normal function. The left ventricle has no regional wall motion abnormalities. The left ventricular internal cavity size was normal in size. There is  no left ventricular hypertrophy. Right Ventricle: The right ventricular size is normal. No increase in right ventricular wall thickness. Right ventricular systolic function is normal. Left Atrium: Left atrial size was normal in size. Right Atrium: Right atrial size was normal in size. Pericardium: There is no evidence of pericardial effusion. Mitral Valve: The mitral valve is normal in structure. Normal mobility of the mitral valve leaflets. Tricuspid Valve: The tricuspid valve is normal in structure. Aortic Valve: The aortic valve is normal in structure. Pulmonic Valve: The pulmonic valve was not assessed. Aorta: The aortic root is normal in size and structure. IAS/Shunts: The interatrial septum was not assessed. Donato Schultz MD Electronically signed by Donato Schultz MD Signature Date/Time: 09/17/2019/3:37:42 PM    Final

## 2019-09-18 NOTE — Progress Notes (Signed)
BP 95/49 at 2140. Scheduled Metoprolol held. Blood pressure remains low throughout night; asymptomatic and no signs of  bleeding at the right radial site.

## 2019-09-18 NOTE — Progress Notes (Signed)
Cardiology Progress Note  Patient ID: Krisandra Bueno MRN: [id number] DOB: Apr 30, 1991 Date of Encounter: 09/18/2019  Primary Cardiologist: Evalina Field, MD  Subjective   Still complains of pain  Inpatient Medications  Scheduled Meds: . vitamin C  500 mg Oral Daily  . aspirin  81 mg Oral Daily  . enoxaparin (LOVENOX) injection  40 mg Subcutaneous Q24H  . fluticasone  2 spray Each Nare Daily  . loratadine  10 mg Oral Daily  . metoprolol tartrate  25 mg Oral BID  . sodium chloride flush  3 mL Intravenous Q12H  . sodium chloride flush  3 mL Intravenous Q12H  . zinc sulfate  220 mg Oral Daily   Continuous Infusions: . sodium chloride     PRN Meds: sodium chloride, acetaminophen **OR** acetaminophen, chlorpheniramine-HYDROcodone, guaiFENesin-dextromethorphan, Ipratropium-Albuterol, ondansetron **OR** ondansetron (ZOFRAN) IV, sodium chloride flush   Vital Signs   Vitals:   09/17/19 2148 09/17/19 2151 09/17/19 2251 09/18/19 0500  BP: (!) 95/49 (!) 95/49 (!) 89/46 (!) 81/51  Pulse: (!) 101 99 93 90  Resp:      Temp:    98.2 F (36.8 C)  TempSrc:    Oral  SpO2:  100% 100% 98%  Weight:      Height:        Intake/Output Summary (Last 24 hours) at 09/18/2019 0825 Last data filed at 09/17/2019 2200 Gross per 24 hour  Intake 1043.23 ml  Output --  Net 1043.23 ml   Last 3 Weights 09/14/2019 09/13/2019 08/01/2018  Weight (lbs) 115 lb 4.8 oz 117 lb 113 lb 8 oz  Weight (kg) 52.3 kg 53.071 kg 51.483 kg      Telemetry  SR   ECG    Physical Exam   Vitals:   09/17/19 2148 09/17/19 2151 09/17/19 2251 09/18/19 0500  BP: (!) 95/49 (!) 95/49 (!) 89/46 (!) 81/51  Pulse: (!) 101 99 93 90  Resp:      Temp:    98.2 F (36.8 C)  TempSrc:    Oral  SpO2:  100% 100% 98%  Weight:      Height:         Intake/Output Summary (Last 24 hours) at 09/18/2019 0825 Last data filed at 09/17/2019 2200 Gross per 24 hour  Intake 1043.23 ml  Output --  Net 1043.23 ml    Last 3 Weights  09/14/2019 09/13/2019 08/01/2018  Weight (lbs) 115 lb 4.8 oz 117 lb 113 lb 8 oz  Weight (kg) 52.3 kg 53.071 kg 51.483 kg    Body mass index is 22.52 kg/m.   Exam deferred to given COVID t   Labs  High Sensitivity Troponin:   Recent Labs  Lab 09/15/19 1624 09/16/19 0448 09/16/19 0926 09/17/19 0834 09/17/19 1046  TROPONINIHS 2,723* 2,497* 2,368* 1,335* 1,408*     Cardiac EnzymesNo results for input(s): TROPONINI in the last 168 hours. No results for input(s): TROPIPOC in the last 168 hours.  Chemistry Recent Labs  Lab 09/16/19 0448 09/17/19 0249 09/18/19 0431  NA 136 137 139  K 3.9 3.9 3.7  CL 107 107 108  CO2 23 23 24   GLUCOSE 95 96 85  BUN 6 5* 5*  CREATININE 0.68 0.71 0.67  CALCIUM 8.1* 7.9* 7.7*  PROT 5.5* 5.5* 5.0*  ALBUMIN 3.0* 3.0* 2.7*  AST 21 21 29   ALT 10 14 24   ALKPHOS 43 43 43  BILITOT 0.5 0.5 0.4  GFRNONAA >60 >60 >60  GFRAA >60 >60 >60  ANIONGAP 6 7  7    Hematology Recent Labs  Lab 09/16/19 0448 09/17/19 0249 09/18/19 0431  WBC 10.4 11.3* 5.6  RBC 4.48 4.29 3.79*  HGB 12.0 11.6* 10.0*  HCT 38.3 36.9 32.5*  MCV 85.5 86.0 85.8  MCH 26.8 27.0 26.4  MCHC 31.3 31.4 30.8  RDW 15.9* 15.9* 16.1*  PLT 211 210 219   BNP Recent Labs  Lab 09/13/19 1902  BNP 43.0    DDimer  Recent Labs  Lab 09/16/19 0448 09/17/19 0249 09/18/19 0431  DDIMER <0.27 0.30 0.40     Radiology  CARDIAC CATHETERIZATION  Result Date: 09/17/2019 Conclusions: 1. No angiographically significant coronary artery disease. 2. Normal left ventricular filling pressure. Recommendations: 1. Ongoing management of COVID-19 and likely myopericarditis contributing to chest pain and elevated troponin. 2. Primary prevention of coronary artery disease. [name], MD [facility]   ECHOCARDIOGRAM LIMITED  Result Date: 09/17/2019    ECHOCARDIOGRAM LIMITED REPORT   Patient Name:   [facility] Date of Exam: 09/17/2019 Medical Rec #:  [id number]   Height:       60.0 in Accession #:     [id number]  Weight:       115.3 lb Date of Birth:  1990-11-19   BSA:          1.477 m Patient Age:    29 years    BP:           98/56 mmHg Patient Gender: F           HR:           93 bpm. Exam Location:  Inpatient Procedure: Limited Echo Indications:    NSTEMI I21.4  History:        Patient has prior history of Echocardiogram examinations, most                 recent 09/14/2019. Risk Factors:Current Smoker. Elevated                 troponin.  Sonographer:    Renella Cunas RDCS Referring Phys: [id number] [facility]  Sonographer Comments: Covid positive. IMPRESSIONS  1. Left ventricular ejection fraction, by estimation, is 60 to 65%. The left ventricle has normal function. The left ventricle has no regional wall motion abnormalities.  2. Right ventricular systolic function is normal. The right ventricular size is normal.  3. The mitral valve is normal in structure.  4. The aortic valve is normal in structure. Comparison(s): Prior images reviewed side by side. The left ventricular function is unchanged. FINDINGS  Left Ventricle: Left ventricular ejection fraction, by estimation, is 60 to 65%. The left ventricle has normal function. The left ventricle has no regional wall motion abnormalities. The left ventricular internal cavity size was normal in size. There is  no left ventricular hypertrophy. Right Ventricle: The right ventricular size is normal. No increase in right ventricular wall thickness. Right ventricular systolic function is normal. Left Atrium: Left atrial size was normal in size. Right Atrium: Right atrial size was normal in size. Pericardium: There is no evidence of pericardial effusion. Mitral Valve: The mitral valve is normal in structure. Normal mobility of the mitral valve leaflets. Tricuspid Valve: The tricuspid valve is normal in structure. Aortic Valve: The aortic valve is normal in structure. Pulmonic Valve: The pulmonic valve was not assessed. Aorta: The aortic root is normal in size and  structure. IAS/Shunts: The interatrial septum was not assessed. Donato Schultz MD Electronically signed by Donato Schultz MD Signature Date/Time: 09/17/2019/3:37:42 PM  Final     Cardiac Studies  TTE 09/14/2019 1. Left ventricular ejection fraction, by estimation, is 60 to 65%. The  left ventricle has normal function. The left ventricle has no regional  wall motion abnormalities. Left ventricular diastolic parameters were  normal.  2. Right ventricular systolic function is normal. The right ventricular  size is normal.  3. The mitral valve is normal in structure. Trivial mitral valve  regurgitation.  4. The aortic valve is tricuspid. Aortic valve regurgitation is not  visualized. No aortic stenosis is present.  5. The inferior vena cava is small suggestive of low RAP. \  Left heart catheterization  Conclusions: 1. No angiographically significant coronary artery disease. 2. Normal left ventricular filling pressure.  Recommendations: 1. Ongoing management of COVID-19 and likely myopericarditis contributing to chest pain and elevated troponin. 2. Primary prevention of coronary artery disease.      Patient Profile  Johnell Wilms is a 29 y.o. female with asthma who was admitted 4/17 with covid URI symptoms and symptoms of CP with exertion, elevated troponin concerning for NSTEMI.   Assessment & Plan   1. Chest pain/NSTEMI Pt with myopericarditis from COVID   Trop trending down   Cath normal Will start colchicine 0.6 daily and motrin 400 q 8  And follow Add protonix prophylaxis Continue tele     Dietrich Pates MD 09/18/2019

## 2019-09-18 NOTE — Care Management (Signed)
1113 09-18-19 Case Manager spoke with patient regarding insurance and primary care provider. Patient is currently working at a new job and does not have insurance at this time. Case Manager discussed assistance at the [facility] Department for primary care provider and medication assistance until patient can get insurance via job. Patient wanted to wait and not schedule appointment at this time. Case Manager will place information on the AVS so that patient can feel free to call at her leisure. Patient may benefit from [facility] for medications once stable to transition home. No further needs at this time. Gala Lewandowsky, RN,BSN Case Manager

## 2019-09-18 NOTE — Progress Notes (Signed)
Pt transferred to 5W02 from 6E. IV in place. Pt verbalized understanding of risk associated with falls, and verbalized understanding to call nsg before up out of bed.  Call light within reach, patient able to voice, and demonstrate understanding.  Skin, clean-dry- intact without evidence of bruising, or skin tears.   No evidence of skin break down noted on exam   Will cont to eval and treat per MD orders.  Quincy Carnes, RN 09/18/2019 12:01 PM

## 2019-09-19 DIAGNOSIS — I319 Disease of pericardium, unspecified: Secondary | ICD-10-CM

## 2019-09-19 LAB — CBC WITH DIFFERENTIAL/PLATELET
Abs Immature Granulocytes: 0.02 10*3/uL (ref 0.00–0.07)
Basophils Absolute: 0 10*3/uL (ref 0.0–0.1)
Basophils Relative: 0 %
Eosinophils Absolute: 0.2 10*3/uL (ref 0.0–0.5)
Eosinophils Relative: 5 %
HCT: 33.9 % — ABNORMAL LOW (ref 36.0–46.0)
Hemoglobin: 10.2 g/dL — ABNORMAL LOW (ref 12.0–15.0)
Immature Granulocytes: 0 %
Lymphocytes Relative: 34 %
Lymphs Abs: 1.6 10*3/uL (ref 0.7–4.0)
MCH: 26.2 pg (ref 26.0–34.0)
MCHC: 30.1 g/dL (ref 30.0–36.0)
MCV: 87.1 fL (ref 80.0–100.0)
Monocytes Absolute: 0.5 10*3/uL (ref 0.1–1.0)
Monocytes Relative: 10 %
Neutro Abs: 2.4 10*3/uL (ref 1.7–7.7)
Neutrophils Relative %: 51 %
Platelets: 236 10*3/uL (ref 150–400)
RBC: 3.89 MIL/uL (ref 3.87–5.11)
RDW: 16.3 % — ABNORMAL HIGH (ref 11.5–15.5)
WBC: 4.6 10*3/uL (ref 4.0–10.5)
nRBC: 0 % (ref 0.0–0.2)

## 2019-09-19 LAB — COMPREHENSIVE METABOLIC PANEL
ALT: 32 U/L (ref 0–44)
AST: 33 U/L (ref 15–41)
Albumin: 2.8 g/dL — ABNORMAL LOW (ref 3.5–5.0)
Alkaline Phosphatase: 44 U/L (ref 38–126)
Anion gap: 6 (ref 5–15)
BUN: 7 mg/dL (ref 6–20)
CO2: 24 mmol/L (ref 22–32)
Calcium: 8 mg/dL — ABNORMAL LOW (ref 8.9–10.3)
Chloride: 109 mmol/L (ref 98–111)
Creatinine, Ser: 0.63 mg/dL (ref 0.44–1.00)
GFR calc Af Amer: >60 mL/min (ref >60)
GFR calc non Af Amer: >60 mL/min (ref >60)
Glucose, Bld: 102 mg/dL — ABNORMAL HIGH (ref 70–99)
Potassium: 3.8 mmol/L (ref 3.5–5.1)
Sodium: 139 mmol/L (ref 135–145)
Total Bilirubin: 0.4 mg/dL (ref 0.3–1.2)
Total Protein: 5.2 g/dL — ABNORMAL LOW (ref 6.5–8.1)

## 2019-09-19 LAB — C-REACTIVE PROTEIN: CRP: 0.6 mg/dL (ref <1.0)

## 2019-09-19 MED ORDER — COLCHICINE 0.6 MG PO TABS: 0.6000 mg | ORAL_TABLET | Freq: Every day | ORAL | 0 refills | Status: DC

## 2019-09-19 MED ORDER — PANTOPRAZOLE SODIUM 40 MG PO TBEC: 40.0000 mg | DELAYED_RELEASE_TABLET | Freq: Every day | ORAL | 0 refills | Status: DC

## 2019-09-19 MED ORDER — IBUPROFEN 400 MG PO TABS: 400.0000 mg | ORAL_TABLET | Freq: Three times a day (TID) | ORAL | 0 refills | Status: AC

## 2019-09-19 MED FILL — IBUPROFEN 400 MG TABS: 400 | 14 days supply | Qty: 42 | Fill #0

## 2019-09-19 MED FILL — PANTOPRAZOLE SOD DR 40 MG T: 40 | 30 days supply | Qty: 30 | Fill #0

## 2019-09-19 MED FILL — COLCHICINE 0.6 MG TABS: 0.6 | 30 days supply | Qty: 30 | Fill #0

## 2019-09-19 NOTE — Discharge Instructions (Signed)
Person Under Monitoring Name: Madeline Davis  Location: [address]  [place] [address]   Infection Prevention Recommendations for Individuals Confirmed to have, or Being Evaluated for, 2019 Novel Coronavirus (COVID-19) Infection Who Receive Care at Home  Individuals who are confirmed to have, or are being evaluated for, COVID-19 should follow the prevention steps below until a healthcare provider or local or state health department says they can return to normal activities.  Stay home except to get medical care You should restrict activities outside your home, except for getting medical care. Do not go to work, school, or public areas, and do not use public transportation or taxis.  Call ahead before visiting your doctor Before your medical appointment, call the healthcare provider and tell them that you have, or are being evaluated for, COVID-19 infection. This will help the healthcare provider's office take steps to keep other people from getting infected. Ask your healthcare provider to call the local or state health department.  Monitor your symptoms Seek prompt medical attention if your illness is worsening (e.g., difficulty breathing). Before going to your medical appointment, call the healthcare provider and tell them that you have, or are being evaluated for, COVID-19 infection. Ask your healthcare provider to call the local or state health department.  Wear a facemask You should wear a facemask that covers your nose and mouth when you are in the same room with other people and when you visit a healthcare provider. People who live with or visit you should also wear a facemask while they are in the same room with you.  Separate yourself from other people in your home As much as possible, you should stay in a different room from other people in your home. Also, you should use a separate bathroom, if available.  Avoid sharing household items You should not share  dishes, drinking glasses, cups, eating utensils, towels, bedding, or other items with other people in your home. After using these items, you should wash them thoroughly with soap and water.  Cover your coughs and sneezes Cover your mouth and nose with a tissue when you cough or sneeze, or you can cough or sneeze into your sleeve. Throw used tissues in a lined trash can, and immediately wash your hands with soap and water for at least 20 seconds or use an alcohol-based hand rub.  Wash your [employer] your hands often and thoroughly with soap and water for at least 20 seconds. You can use an alcohol-based hand sanitizer if soap and water are not available and if your hands are not visibly dirty. Avoid touching your eyes, nose, and mouth with unwashed hands.   Prevention Steps for Caregivers and Household Members of Individuals Confirmed to have, or Being Evaluated for, COVID-19 Infection Being Cared for in the Home  If you live with, or provide care at home for, a person confirmed to have, or being evaluated for, COVID-19 infection please follow these guidelines to prevent infection:  Follow healthcare provider's instructions Make sure that you understand and can help the patient follow any healthcare provider instructions for all care.  Provide for the patient's basic needs You should help the patient with basic needs in the home and provide support for getting groceries, prescriptions, and other personal needs.  Monitor the patient's symptoms If they are getting sicker, call his or her medical provider and tell them that the patient has, or is being evaluated for, COVID-19 infection. This will help the healthcare provider's office  take steps to keep other people from getting infected. Ask the healthcare provider to call the local or state health department.  Limit the number of people who have contact with the patient  If possible, have only one caregiver for the patient.  Other  household members should stay in another home or place of residence. If this is not possible, they should stay  in another room, or be separated from the patient as much as possible. Use a separate bathroom, if available.  Restrict visitors who do not have an essential need to be in the home.  Keep older adults, very young children, and other sick people away from the patient Keep older adults, very young children, and those who have compromised immune systems or chronic health conditions away from the patient. This includes people with chronic heart, lung, or kidney conditions, diabetes, and cancer.  Ensure good ventilation Make sure that shared spaces in the home have good air flow, such as from an air conditioner or an opened window, weather permitting.  Wash your hands often  Wash your hands often and thoroughly with soap and water for at least 20 seconds. You can use an alcohol based hand sanitizer if soap and water are not available and if your hands are not visibly dirty.  Avoid touching your eyes, nose, and mouth with unwashed hands.  Use disposable paper towels to dry your hands. If not available, use dedicated cloth towels and replace them when they become wet.  Wear a facemask and gloves  Wear a disposable facemask at all times in the room and gloves when you touch or have contact with the patient's blood, body fluids, and/or secretions or excretions, such as sweat, saliva, sputum, nasal mucus, vomit, urine, or feces.  Ensure the mask fits over your nose and mouth tightly, and do not touch it during use.  Throw out disposable facemasks and gloves after using them. Do not reuse.  Wash your hands immediately after removing your facemask and gloves.  If your personal clothing becomes contaminated, carefully remove clothing and launder. Wash your hands after handling contaminated clothing.  Place all used disposable facemasks, gloves, and other waste in a lined container before  disposing them with other household waste.  Remove gloves and wash your hands immediately after handling these items.  Do not share dishes, glasses, or other household items with the patient  Avoid sharing household items. You should not share dishes, drinking glasses, cups, eating utensils, towels, bedding, or other items with a patient who is confirmed to have, or being evaluated for, COVID-19 infection.  After the person uses these items, you should wash them thoroughly with soap and water.  Wash laundry thoroughly  Immediately remove and wash clothes or bedding that have blood, body fluids, and/or secretions or excretions, such as sweat, saliva, sputum, nasal mucus, vomit, urine, or feces, on them.  Wear gloves when handling laundry from the patient.  Read and follow directions on labels of laundry or clothing items and detergent. In general, wash and dry with the warmest temperatures recommended on the label.  Clean all areas the individual has used often  Clean all touchable surfaces, such as counters, tabletops, doorknobs, bathroom fixtures, toilets, phones, keyboards, tablets, and bedside tables, every day. Also, clean any surfaces that may have blood, body fluids, and/or secretions or excretions on them.  Wear gloves when cleaning surfaces the patient has come in contact with.  Use a diluted bleach solution (e.g., dilute bleach with 1 part  bleach and 10 parts water) or a household disinfectant with a label that says EPA-registered for coronaviruses. To make a bleach solution at home, add 1 tablespoon of bleach to 1 quart (4 cups) of water. For a larger supply, add  cup of bleach to 1 gallon (16 cups) of water.  Read labels of cleaning products and follow recommendations provided on product labels. Labels contain instructions for safe and effective use of the cleaning product including precautions you should take when applying the product, such as wearing gloves or eye protection  and making sure you have good ventilation during use of the product.  Remove gloves and wash hands immediately after cleaning.  Monitor yourself for signs and symptoms of illness Caregivers and household members are considered close contacts, should monitor their health, and will be asked to limit movement outside of the home to the extent possible. Follow the monitoring steps for close contacts listed on the symptom monitoring form.   ? If you have additional questions, contact your local health department or call the epidemiologist on call at [phone] (available 24/7). ? This guidance is subject to change. For the most up-to-date guidance from [facility], please refer to their website: [email or web address]

## 2019-09-19 NOTE — Discharge Summary (Signed)
PATIENT DETAILS Name: Madeline Davis Age: 29 y.o. Sex: female Date of Birth: May 18, 1991 MRN: [id number]. Admitting Physician: Maretta Bees, MD WUJ:[id number], No Pcp Per  Admit Date: 09/13/2019 Discharge date: 09/19/2019  Recommendations for Outpatient Follow-up:  1. Follow up with PCP in 1-2 weeks 2. Please obtain CMP/CBC in one week 3. Repeat Chest Xray in 4-6 week 4. Please follow up on the following pending results:  Admitted From:  Home   Disposition: Home  Home Health: No  Equipment/Devices: None  Discharge Condition: Stable  CODE STATUS: FULL CODE  Diet recommendation:  Diet Order            Diet - low sodium heart healthy        Diet regular Room service appropriate? Yes; Fluid consistency: Thin  Diet effective now               Brief Narrative: Patient is a 29 y.o. female with PMHx of bronchial asthma-who was diagnosed with COVID-19 on admission-presented to the ED at [facility] with exertional chest pain and dyspnea along with palpitations.  For the past 1 week prior to this hospitalization-patient was having URI/allergy-like symptoms.  Significant Events: 4/15>> admit to [facility] for exertional chest pain and shortness of breath 4/15>> CTA chest negative for PE 4/16>> echo-EF 60-65%, no wall motion regional abnormalities 4/19>> Limited echo-EF 60-65% no regional wall motion abnormalities 4/19>> LHC-no angiographically significant CAD  COVID-19 medications: Steroids: Not started Remdesivir: Not started  Antibiotics: None  Microbiology data: None  Procedures: 4/19>> LHC-no angiographically significant CAD  Consults: Cardiology  Brief Hospital Course: Chest pain/exertional dyspnea with elevated troponin secondary to myopericarditis (non-STEMI ruled out): Initially considered to be non-STEMI-however LHC negative for significant CAD-now thought to have myopericarditis secondary to COVID-19.    Initially treated with IV heparin,  statin/beta-blocker and aspirin-but since thought to have myopericarditis-started on NSAIDs and colchicine-she feels better today-significantly less pain compared to the past few days.  She is ambulating in the room this morning and is requesting discharge.  Spoke with cardiology-continue NSAIDs and colchicine on discharge, they will follow-up in 1 week and determine appropriate duration of NSAIDs-we will probably require colchicine for 3 months.  COVID-19 infection: Without pneumonia on imaging studies-CRP not significantly elevated.  Acknowledges URI/allergic-like symptoms for the past 1 week prior to this hospital stay.  Since otherwise asymptomatic without any pulmonary symptoms-not started on steroids/remdesivir.   COVID-19 Labs:  Recent Labs    09/17/19 0249 09/18/19 0431 09/19/19 0450  DDIMER 0.30 0.40  --   FERRITIN 17 18  --   CRP 0.7 0.7 0.6    Lab Results  Component Value Date   SARSCOV2NAA POSITIVE (A) 09/14/2019     Discharge Diagnoses:  Principal Problem:   COVID-19 virus infection Active Problems:   Elevated troponin   Hypokalemia   Asthma exacerbation   Tobacco use   ACS (acute coronary syndrome) [facility])   Discharge Instructions:    Person Under Monitoring Name: Madeline Davis  Location: [address]  [place] [address]   Infection Prevention Recommendations for Individuals Confirmed to have, or Being Evaluated for, 2019 Novel Coronavirus (COVID-19) Infection Who Receive Care at Home  Individuals who are confirmed to have, or are being evaluated for, COVID-19 should follow the prevention steps below until a healthcare provider or local or state health department says they can return to normal activities.  Stay home except to get medical care You should restrict activities outside your home, except for  getting medical care. Do not go to work, school, or public areas, and do not use public transportation or taxis.  Call ahead before visiting your  doctor Before your medical appointment, call the healthcare provider and tell them that you have, or are being evaluated for, COVID-19 infection. This will help the healthcare provider's office take steps to keep other people from getting infected. Ask your healthcare provider to call the local or state health department.  Monitor your symptoms Seek prompt medical attention if your illness is worsening (e.g., difficulty breathing). Before going to your medical appointment, call the healthcare provider and tell them that you have, or are being evaluated for, COVID-19 infection. Ask your healthcare provider to call the local or state health department.  Wear a facemask You should wear a facemask that covers your nose and mouth when you are in the same room with other people and when you visit a healthcare provider. People who live with or visit you should also wear a facemask while they are in the same room with you.  Separate yourself from other people in your home As much as possible, you should stay in a different room from other people in your home. Also, you should use a separate bathroom, if available.  Avoid sharing household items You should not share dishes, drinking glasses, cups, eating utensils, towels, bedding, or other items with other people in your home. After using these items, you should wash them thoroughly with soap and water.  Cover your coughs and sneezes Cover your mouth and nose with a tissue when you cough or sneeze, or you can cough or sneeze into your sleeve. Throw used tissues in a lined trash can, and immediately wash your hands with soap and water for at least 20 seconds or use an alcohol-based hand rub.  Wash your [employer] your hands often and thoroughly with soap and water for at least 20 seconds. You can use an alcohol-based hand sanitizer if soap and water are not available and if your hands are not visibly dirty. Avoid touching your eyes, nose,  and mouth with unwashed hands.   Prevention Steps for Caregivers and Household Members of Individuals Confirmed to have, or Being Evaluated for, COVID-19 Infection Being Cared for in the Home  If you live with, or provide care at home for, a person confirmed to have, or being evaluated for, COVID-19 infection please follow these guidelines to prevent infection:  Follow healthcare provider's instructions Make sure that you understand and can help the patient follow any healthcare provider instructions for all care.  Provide for the patient's basic needs You should help the patient with basic needs in the home and provide support for getting groceries, prescriptions, and other personal needs.  Monitor the patient's symptoms If they are getting sicker, call his or her medical provider and tell them that the patient has, or is being evaluated for, COVID-19 infection. This will help the healthcare provider's office take steps to keep other people from getting infected. Ask the healthcare provider to call the local or state health department.  Limit the number of people who have contact with the patient  If possible, have only one caregiver for the patient.  Other household members should stay in another home or place of residence. If this is not possible, they should stay  in another room, or be separated from the patient as much as possible. Use a separate bathroom, if available.  Restrict visitors who do not have an essential  need to be in the home.  Keep older adults, very young children, and other sick people away from the patient Keep older adults, very young children, and those who have compromised immune systems or chronic health conditions away from the patient. This includes people with chronic heart, lung, or kidney conditions, diabetes, and cancer.  Ensure good ventilation Make sure that shared spaces in the home have good air flow, such as from an air conditioner or an  opened window, weather permitting.  Wash your hands often  Wash your hands often and thoroughly with soap and water for at least 20 seconds. You can use an alcohol based hand sanitizer if soap and water are not available and if your hands are not visibly dirty.  Avoid touching your eyes, nose, and mouth with unwashed hands.  Use disposable paper towels to dry your hands. If not available, use dedicated cloth towels and replace them when they become wet.  Wear a facemask and gloves  Wear a disposable facemask at all times in the room and gloves when you touch or have contact with the patient's blood, body fluids, and/or secretions or excretions, such as sweat, saliva, sputum, nasal mucus, vomit, urine, or feces.  Ensure the mask fits over your nose and mouth tightly, and do not touch it during use.  Throw out disposable facemasks and gloves after using them. Do not reuse.  Wash your hands immediately after removing your facemask and gloves.  If your personal clothing becomes contaminated, carefully remove clothing and launder. Wash your hands after handling contaminated clothing.  Place all used disposable facemasks, gloves, and other waste in a lined container before disposing them with other household waste.  Remove gloves and wash your hands immediately after handling these items.  Do not share dishes, glasses, or other household items with the patient  Avoid sharing household items. You should not share dishes, drinking glasses, cups, eating utensils, towels, bedding, or other items with a patient who is confirmed to have, or being evaluated for, COVID-19 infection.  After the person uses these items, you should wash them thoroughly with soap and water.  Wash laundry thoroughly  Immediately remove and wash clothes or bedding that have blood, body fluids, and/or secretions or excretions, such as sweat, saliva, sputum, nasal mucus, vomit, urine, or feces, on them.  Wear gloves  when handling laundry from the patient.  Read and follow directions on labels of laundry or clothing items and detergent. In general, wash and dry with the warmest temperatures recommended on the label.  Clean all areas the individual has used often  Clean all touchable surfaces, such as counters, tabletops, doorknobs, bathroom fixtures, toilets, phones, keyboards, tablets, and bedside tables, every day. Also, clean any surfaces that may have blood, body fluids, and/or secretions or excretions on them.  Wear gloves when cleaning surfaces the patient has come in contact with.  Use a diluted bleach solution (e.g., dilute bleach with 1 part bleach and 10 parts water) or a household disinfectant with a label that says EPA-registered for coronaviruses. To make a bleach solution at home, add 1 tablespoon of bleach to 1 quart (4 cups) of water. For a larger supply, add  cup of bleach to 1 gallon (16 cups) of water.  Read labels of cleaning products and follow recommendations provided on product labels. Labels contain instructions for safe and effective use of the cleaning product including precautions you should take when applying the product, such as wearing gloves or eye protection  and making sure you have good ventilation during use of the product.  Remove gloves and wash hands immediately after cleaning.  Monitor yourself for signs and symptoms of illness Caregivers and household members are considered close contacts, should monitor their health, and will be asked to limit movement outside of the home to the extent possible. Follow the monitoring steps for close contacts listed on the symptom monitoring form.   ? If you have additional questions, contact your local health department or call the epidemiologist on call at [phone] (available 24/7). ? This guidance is subject to change. For the most up-to-date guidance from CDC, please refer to their  website: TripMetro.huhttps://www.cdc.gov/coronavirus/2019-ncov/hcp/guidance-prevent-spread.html    Activity:  As tolerated   Discharge Instructions    Call MD for:  difficulty breathing, headache or visual disturbances   Complete by: As directed    Call MD for:  extreme fatigue   Complete by: As directed    Call MD for:  persistant dizziness or light-headedness   Complete by: As directed    Call MD for:  persistant nausea and vomiting   Complete by: As directed    Call MD for:  severe uncontrolled pain   Complete by: As directed    Diet - low sodium heart healthy   Complete by: As directed    Discharge instructions   Complete by: As directed    Follow with Primary MD  in 1-2 weeks  You will get a call from cardiology office to schedule a post hospital discharge follow-up visit, cardiology plans to see you in the next 1 week or so.  If you do not hear from them please call [phone].  Please get a complete blood count and chemistry panel checked by your Primary MD at your next visit, and again as instructed by your Primary MD.  Get Medicines reviewed and adjusted: Please take all your medications with you for your next visit with your Primary MD  Laboratory/radiological data: Please request your Primary MD to go over all hospital tests and procedure/radiological results at the follow up, please ask your Primary MD to get all Hospital records sent to his/her office.  In some cases, they will be blood work, cultures and biopsy results pending at the time of your discharge. Please request that your primary care M.D. follows up on these results.  Also Note the following: If you experience worsening of your admission symptoms, develop shortness of breath, life threatening emergency, suicidal or homicidal thoughts you must seek medical attention immediately by calling 911 or calling your MD immediately  if symptoms less severe.  You must read complete instructions/literature along with all the  possible adverse reactions/side effects for all the Medicines you take and that have been prescribed to you. Take any new Medicines after you have completely understood and accpet all the possible adverse reactions/side effects.   Do not drive when taking Pain medications or sleeping medications (Benzodaizepines)  Do not take more than prescribed Pain, Sleep and Anxiety Medications. It is not advisable to combine anxiety,sleep and pain medications without talking with your primary care practitioner  Special Instructions: If you have smoked or chewed Tobacco  in the last 2 yrs please stop smoking, stop any regular Alcohol  and or any Recreational drug use.  Wear Seat belts while driving.  Please note: You were cared for by a hospitalist during your hospital stay. Once you are discharged, your primary care physician will handle any further medical issues. Please note that NO REFILLS  for any discharge medications will be authorized once you are discharged, as it is imperative that you return to your primary care physician (or establish a relationship with a primary care physician if you do not have one) for your post hospital discharge needs so that they can reassess your need for medications and monitor your lab values.   2 weeks of isolation from 09/14/2019   Increase activity slowly   Complete by: As directed      Allergies as of 09/19/2019   No Known Allergies     Medication List    TAKE these medications   albuterol 108 (90 Base) MCG/ACT inhaler Commonly known as: VENTOLIN HFA Inhale 1-2 puffs into the lungs every 6 (six) hours as needed for wheezing or shortness of breath. What changed: Another medication with the same name was removed. Continue taking this medication, and follow the directions you see here.   colchicine 0.6 MG tablet Take 1 tablet (0.6 mg total) by mouth daily. Start taking on: September 20, 2019   ibuprofen 400 MG tablet Commonly known as: ADVIL Take 1 tablet (400  mg total) by mouth every 8 (eight) hours for 14 days. What changed:   medication strength  See the new instructions.   pantoprazole 40 MG tablet Commonly known as: PROTONIX Take 1 tablet (40 mg total) by mouth daily at 12 noon.      Follow-up Information    [facility], [facility]. Call.   Why: to schedule hospital folllow up appointment within 1-2 weeks post hospitalization.  Contact information: [address] [place] [address] [phone]        [facility]. Schedule an appointment as soon as possible for a visit in 1 week(s).   Specialty: Cardiology Contact information: [address] [place] [address] [phone]       Sande Rives, MD. Schedule an appointment as soon as possible for a visit in 1 week(s).   Specialties: Internal Medicine, Cardiology, Radiology Contact information: [address] [place] [address] [phone]          No Known Allergies    Other Procedures/Studies: CT Angio Chest PE W/Cm &/Or Wo Cm  Result Date: 09/13/2019 CLINICAL DATA:  Shortness of breath EXAM: CT ANGIOGRAPHY CHEST WITH CONTRAST TECHNIQUE: Multidetector CT imaging of the chest was performed using the standard protocol during bolus administration of intravenous contrast. Multiplanar CT image reconstructions and MIPs were obtained to evaluate the vascular anatomy. CONTRAST:  OMNIPAQUE IOHEXOL 350 MG/ML SOLN COMPARISON:  Radiograph 09/13/2019 FINDINGS: Cardiovascular: Satisfactory opacification the pulmonary arteries to the segmental level. No pulmonary artery filling defects are identified. Central pulmonary arteries are normal caliber. Normal heart size. No pericardial effusion. The aorta is normal caliber. Normal 3 vessel branching of the aortic arch. Proximal great vessels are unremarkable. Mediastinum/Nodes: Wedge-shaped soft tissue attenuation in the anterior mediastinum is most likely reflective of thymic  remnant in a patient of this age given location and configuration. No mediastinal fluid or gas. Normal thyroid gland and thoracic inlet. No acute abnormality of the trachea or esophagus. No worrisome mediastinal, hilar or axillary adenopathy. Lungs/Pleura: No consolidation, features of edema, pneumothorax, or effusion. No suspicious pulmonary nodules or masses. The Upper Abdomen: No acute abnormalities present in the visualized portions of the upper abdomen. Musculoskeletal: No acute osseous abnormality or suspicious osseous lesion. Bilateral metallic nipple ornamentation. No worrisome chest wall lesions or mass. Review of the MIP images confirms the above findings. IMPRESSION: 1. No  evidence of pulmonary embolism or other acute cardiopulmonary process. 2. 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 Electronically Signed   By: Kreg Shropshire M.D.   On: 09/13/2019 22:44   CARDIAC CATHETERIZATION  Result Date: 09/17/2019 Conclusions: 1. No angiographically significant coronary artery disease. 2. Normal left ventricular filling pressure. Recommendations: 1. Ongoing management of COVID-19 and likely myopericarditis contributing to chest pain and elevated troponin. 2. Primary prevention of coronary artery disease. [name], MD [facility]   DG Chest Port 1 View  Result Date: 09/16/2019 CLINICAL DATA:  Dyspnea, COVID-19 positive EXAM: PORTABLE CHEST 1 VIEW COMPARISON:  09/13/2019 chest radiograph. FINDINGS: Stable cardiomediastinal silhouette with normal heart size. No pneumothorax. No pleural effusion. Lungs appear clear, with no acute consolidative airspace disease and no pulmonary edema. IMPRESSION: No active disease. Electronically Signed   By: Delbert Phenix M.D.   On: 09/16/2019 10:39   DG Chest Port 1 View  Result Date: 09/13/2019 CLINICAL DATA:  Shortness of breath, dyspnea  EXAM: PORTABLE CHEST 1 VIEW COMPARISON:  08/03/2015 FINDINGS: The heart size and mediastinal contours are within normal limits. Both lungs are clear. The visualized skeletal structures are unremarkable. IMPRESSION: No acute abnormality of the lungs in AP portable projection. Electronically Signed   By: Lauralyn Primes M.D.   On: 09/13/2019 19:10   ECHOCARDIOGRAM COMPLETE  Result Date: 09/14/2019    ECHOCARDIOGRAM REPORT   Patient Name:   Madeline Davis Date of Exam: 09/14/2019 Medical Rec #:  [id number]   Height:       60.0 in Accession #:    [id number]  Weight:       117.0 lb Date of Birth:  24-Jun-1990   BSA:          1.486 m Patient Age:    28 years    BP:           112/76 mmHg Patient Gender: F           HR:           92 bpm. [facility]am [facility] Procedure: 2D Echo Indications:    Elevated Troponin  History:        Patient has no prior history of Echocardiogram examinations.                 Risk Factors:Current Smoker. COVID-19 virus infection, Elevated                 troponin.  Sonographer:    Jeryl Columbia RDCS (AE) Referring Phys: [id number] [facility] ORTIZ IMPRESSIONS  1. Left ventricular ejection fraction, by estimation, is 60 to 65%. The left ventricle has normal function. The left ventricle has no regional wall motion abnormalities. Left ventricular diastolic parameters were normal.  2. Right ventricular systolic function is normal. The right ventricular size is normal.  3. The mitral valve is normal in structure. Trivial mitral valve regurgitation.  4. The aortic valve is tricuspid. Aortic valve regurgitation is not visualized. No aortic stenosis is present.  5. The inferior vena cava is small suggestive of low RAP. FINDINGS  Left Ventricle: Left ventricular ejection fraction, by estimation, is 60 to 65%. The left ventricle has normal function. The left ventricle has no regional wall motion abnormalities. The left ventricular internal cavity size was normal in size. There is  no left ventricular  hypertrophy. Left ventricular diastolic parameters were normal. Right Ventricle:  The right ventricular size is normal. No increase in right ventricular wall thickness. Right ventricular systolic function is normal. Left Atrium: Left atrial size was normal in size. Right Atrium: Right atrial size was normal in size. Pericardium: There is no evidence of pericardial effusion. Mitral Valve: The mitral valve is normal in structure. Trivial mitral valve regurgitation. Tricuspid Valve: The tricuspid valve is normal in structure. Tricuspid valve regurgitation is not demonstrated. Aortic Valve: The aortic valve is tricuspid. Aortic valve regurgitation is not visualized. No aortic stenosis is present. Pulmonic Valve: The pulmonic valve was not well visualized. Pulmonic valve regurgitation is not visualized. Aorta: The aortic root is normal in size and structure. Venous: The inferior vena cava is small suggestive of low RAP. IAS/Shunts: No atrial level shunt detected by color flow Doppler.  LEFT VENTRICLE PLAX 2D LVIDd:         4.06 cm Diastology LVIDs:         2.66 cm LV e' lateral:   10.10 cm/s LV PW:         1.20 cm LV E/e' lateral: 6.9 LV IVS:        0.90 cm LV e' medial:    11.50 cm/s                        LV E/e' medial:  6.0  RIGHT VENTRICLE RV S prime:     10.90 cm/s TAPSE (M-mode): 1.7 cm LEFT ATRIUM           Index LA diam:      2.30 cm 1.55 cm/m LA Vol (A4C): 29.0 ml 19.51 ml/m   AORTA Ao Root diam: 2.20 cm MITRAL VALVE MV Area (PHT): 3.65 cm MV Decel Time: 208 msec MV E velocity: 69.40 cm/s MV A velocity: 44.60 cm/s MV E/A ratio:  1.56 Kate Sable MD Electronically signed by Kate Sable MD Signature Date/Time: 09/14/2019/2:58:26 PM    Final    ECHOCARDIOGRAM LIMITED  Result Date: 09/17/2019    ECHOCARDIOGRAM LIMITED REPORT   Patient Name:   Madeline [facility] Date of Exam: 09/17/2019 Medical Rec #:  [id number]   Height:       60.0 in Accession #:    [id number]  Weight:       115.3 lb Date of Birth:   February 14, 1991   BSA:          1.477 m Patient Age:    28 years    BP:           98/56 mmHg Patient Gender: F           HR:           93 bpm. Exam Location:  Inpatient Procedure: Limited Echo Indications:    NSTEMI I21.4  History:        Patient has prior history of Echocardiogram examinations, most                 recent 09/14/2019. Risk Factors:Current Smoker. Elevated                 troponin.  Sonographer:    Vickie Epley RDCS Referring Phys: [id number] [facility]  Sonographer Comments: Covid positive. IMPRESSIONS  1. Left ventricular ejection fraction, by estimation, is 60 to 65%. The left ventricle has normal function. The left ventricle has no regional wall motion abnormalities.  2. Right ventricular systolic function is normal. The right ventricular size is normal.  3. The mitral valve is normal in  structure.  4. The aortic valve is normal in structure. Comparison(s): Prior images reviewed side by side. The left ventricular function is unchanged. FINDINGS  Left Ventricle: Left ventricular ejection fraction, by estimation, is 60 to 65%. The left ventricle has normal function. The left ventricle has no regional wall motion abnormalities. The left ventricular internal cavity size was normal in size. There is  no left ventricular hypertrophy. Right Ventricle: The right ventricular size is normal. No increase in right ventricular wall thickness. Right ventricular systolic function is normal. Left Atrium: Left atrial size was normal in size. Right Atrium: Right atrial size was normal in size. Pericardium: There is no evidence of pericardial effusion. Mitral Valve: The mitral valve is normal in structure. Normal mobility of the mitral valve leaflets. Tricuspid Valve: The tricuspid valve is normal in structure. Aortic Valve: The aortic valve is normal in structure. Pulmonic Valve: The pulmonic valve was not assessed. Aorta: The aortic root is normal in size and structure. IAS/Shunts: The interatrial septum was not  assessed. Donato Schultz MD Electronically signed by Donato Schultz MD Signature Date/Time: 09/17/2019/3:37:42 PM    Final      TODAY-DAY OF DISCHARGE:  Subjective:   Madeline Davis today has no headache,no chest abdominal pain,no new weakness tingling or numbness, feels much better wants to go home today.   Objective:   Blood pressure (!) 94/50, pulse 77, temperature 98.1 F (36.7 C), temperature source Oral, resp. rate 19, height 5' (1.524 m), weight 52.3 kg, last menstrual period 09/06/2019, SpO2 100 %.  Intake/Output Summary (Last 24 hours) at 09/19/2019 1102 Last data filed at 09/18/2019 2000 Gross per 24 hour  Intake 480 ml  Output 4 ml  Net 476 ml   Filed Weights   09/13/19 1544 09/14/19 2338  Weight: 53.1 kg 52.3 kg    Exam: Awake Alert, Oriented *3, No new F.N deficits, Normal affect [place].AT,PERRAL Supple Neck,No JVD, No cervical lymphadenopathy appriciated.  Symmetrical Chest wall movement, Good air movement bilaterally, CTAB RRR,No Gallops,Rubs or new Murmurs, No Parasternal Heave +ve B.Sounds, Abd Soft, Non tender, No organomegaly appriciated, No rebound -guarding or rigidity. No Cyanosis, Clubbing or edema, No new Rash or bruise   PERTINENT RADIOLOGIC STUDIES: CT Angio Chest PE W/Cm &/Or Wo Cm  Result Date: 09/13/2019 CLINICAL DATA:  Shortness of breath EXAM: CT ANGIOGRAPHY CHEST WITH CONTRAST TECHNIQUE: Multidetector CT imaging of the chest was performed using the standard protocol during bolus administration of intravenous contrast. Multiplanar CT image reconstructions and MIPs were obtained to evaluate the vascular anatomy. CONTRAST:  OMNIPAQUE IOHEXOL 350 MG/ML SOLN COMPARISON:  Radiograph 09/13/2019 FINDINGS: Cardiovascular: Satisfactory opacification the pulmonary arteries to the segmental level. No pulmonary artery filling defects are identified. Central pulmonary arteries are normal caliber. Normal heart size. No pericardial effusion. The aorta is normal caliber.  Normal 3 vessel branching of the aortic arch. Proximal great vessels are unremarkable. 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 No mediastinal fluid or gas. Normal thyroid gland and thoracic inlet. No acute abnormality of the trachea or esophagus. No worrisome mediastinal, hilar or axillary adenopathy. Lungs/Pleura: No consolidation, features of edema, pneumothorax, or effusion. No suspicious pulmonary nodules or masses. The Upper Abdomen: No acute abnormalities present in the visualized portions of the upper abdomen. Musculoskeletal: No acute osseous abnormality or suspicious osseous lesion. Bilateral metallic nipple ornamentation. No worrisome chest wall lesions or mass. Review of  the MIP images confirms the above findings. IMPRESSION: 1. No evidence of pulmonary embolism or other acute cardiopulmonary process. 2. 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 Electronically Signed   By: Kreg Shropshire M.D.   On: 09/13/2019 22:44   CARDIAC CATHETERIZATION  Result Date: 09/17/2019 Conclusions: 1. No angiographically significant coronary artery disease. 2. Normal left ventricular filling pressure. Recommendations: 1. Ongoing management of COVID-19 and likely myopericarditis contributing to chest pain and elevated troponin. 2. Primary prevention of coronary artery disease. [name], MD [facility]   DG Chest Port 1 View  Result Date: 09/16/2019 CLINICAL DATA:  Dyspnea, COVID-19 positive EXAM: PORTABLE CHEST 1 VIEW COMPARISON:  09/13/2019 chest radiograph. FINDINGS: Stable cardiomediastinal silhouette with normal heart size. No pneumothorax. No pleural effusion. Lungs appear clear, with no acute consolidative airspace disease and no  pulmonary edema. IMPRESSION: No active disease. Electronically Signed   By: Delbert Phenix M.D.   On: 09/16/2019 10:39   DG Chest Port 1 View  Result Date: 09/13/2019 CLINICAL DATA:  Shortness of breath, dyspnea EXAM: PORTABLE CHEST 1 VIEW COMPARISON:  08/03/2015 FINDINGS: The heart size and mediastinal contours are within normal limits. Both lungs are clear. The visualized skeletal structures are unremarkable. IMPRESSION: No acute abnormality of the lungs in AP portable projection. Electronically Signed   By: Lauralyn Primes M.D.   On: 09/13/2019 19:10   ECHOCARDIOGRAM COMPLETE  Result Date: 09/14/2019    ECHOCARDIOGRAM REPORT   Patient Name:   Madeline Davis Date of Exam: 09/14/2019 Medical Rec #:  [id number]   Height:       60.0 in Accession #:    [id number]  Weight:       117.0 lb Date of Birth:  05-29-1991   BSA:          1.486 m Patient Age:    28 years    BP:           112/76 mmHg Patient Gender: F           HR:           92 bpm. [facility] Procedure: 2D Echo Indications:    Elevated Troponin  History:        Patient has no prior history of Echocardiogram examinations.                 Risk Factors:Current Smoker. COVID-19 virus infection, Elevated                 troponin.  Sonographer:    Jeryl Columbia RDCS (AE) Referring Phys: [id number] DAVID MANUEL ORTIZ IMPRESSIONS  1. Left ventricular ejection fraction, by estimation, is 60 to 65%. The left ventricle has normal function. The left ventricle has no regional wall motion abnormalities. Left ventricular diastolic parameters were normal.  2. Right ventricular systolic function is normal. The right ventricular size is normal.  3. The mitral valve is normal in structure. Trivial mitral valve regurgitation.  4. The aortic valve is tricuspid. Aortic valve regurgitation is not visualized. No aortic stenosis is present.  5. The inferior vena cava is small suggestive of low RAP. FINDINGS  Left Ventricle: Left ventricular ejection fraction, by  estimation, is 60 to 65%. The left ventricle has normal function. The left ventricle has no regional wall motion abnormalities. The left ventricular internal cavity size was normal in size. There is  no left  ventricular hypertrophy. Left ventricular diastolic parameters were normal. Right Ventricle: The right ventricular size is normal. No increase in right ventricular wall thickness. Right ventricular systolic function is normal. Left Atrium: Left atrial size was normal in size. Right Atrium: Right atrial size was normal in size. Pericardium: There is no evidence of pericardial effusion. Mitral Valve: The mitral valve is normal in structure. Trivial mitral valve regurgitation. Tricuspid Valve: The tricuspid valve is normal in structure. Tricuspid valve regurgitation is not demonstrated. Aortic Valve: The aortic valve is tricuspid. Aortic valve regurgitation is not visualized. No aortic stenosis is present. Pulmonic Valve: The pulmonic valve was not well visualized. Pulmonic valve regurgitation is not visualized. Aorta: The aortic root is normal in size and structure. Venous: The inferior vena cava is small suggestive of low RAP. IAS/Shunts: No atrial level shunt detected by color flow Doppler.  LEFT VENTRICLE PLAX 2D LVIDd:         4.06 cm Diastology LVIDs:         2.66 cm LV e' lateral:   10.10 cm/s LV PW:         1.20 cm LV E/e' lateral: 6.9 LV IVS:        0.90 cm LV e' medial:    11.50 cm/s                        LV E/e' medial:  6.0  RIGHT VENTRICLE RV S prime:     10.90 cm/s TAPSE (M-mode): 1.7 cm LEFT ATRIUM           Index LA diam:      2.30 cm 1.55 cm/m LA Vol (A4C): 29.0 ml 19.51 ml/m   AORTA Ao Root diam: 2.20 cm MITRAL VALVE MV Area (PHT): 3.65 cm MV Decel Time: 208 msec MV E velocity: 69.40 cm/s MV A velocity: 44.60 cm/s MV E/A ratio:  1.56 Prentice Docker MD Electronically signed by Prentice Docker MD Signature Date/Time: 09/14/2019/2:58:26 PM    Final    ECHOCARDIOGRAM LIMITED  Result Date:  09/17/2019    ECHOCARDIOGRAM LIMITED REPORT   Patient Name:   Madeline Davis Date of Exam: 09/17/2019 Medical Rec #:  [id number]   Height:       60.0 in Accession #:    [id number]  Weight:       115.3 lb Date of Birth:  [birth year]   BSA:          1.477 m Patient Age:    28 years    BP:           98/56 mmHg Patient Gender: F           HR:           93 bpm. Exam Location:  Inpatient Procedure: Limited Echo Indications:    NSTEMI I21.4  History:        Patient has prior history of Echocardiogram examinations, most                 recent 09/14/2019. Risk Factors:Current Smoker. Elevated                 troponin.  Sonographer:    Renella Cunas RDCS Referring Phys: [id number] [facility]AL  Sonographer Comments: Covid positive. IMPRESSIONS  1. Left ventricular ejection fraction, by estimation, is 60 to 65%. The left ventricle has normal function. The left ventricle has no regional wall motion abnormalities.  2. Right ventricular systolic function is normal. The right ventricular size  is normal.  3. The mitral valve is normal in structure.  4. The aortic valve is normal in structure. Comparison(s): Prior images reviewed side by side. The left ventricular function is unchanged. FINDINGS  Left Ventricle: Left ventricular ejection fraction, by estimation, is 60 to 65%. The left ventricle has normal function. The left ventricle has no regional wall motion abnormalities. The left ventricular internal cavity size was normal in size. There is  no left ventricular hypertrophy. Right Ventricle: The right ventricular size is normal. No increase in right ventricular wall thickness. Right ventricular systolic function is normal. Left Atrium: Left atrial size was normal in size. Right Atrium: Right atrial size was normal in size. Pericardium: There is no evidence of pericardial effusion. Mitral Valve: The mitral valve is normal in structure. Normal mobility of the mitral valve leaflets. Tricuspid Valve: The tricuspid valve is normal in  structure. Aortic Valve: The aortic valve is normal in structure. Pulmonic Valve: The pulmonic valve was not assessed. Aorta: The aortic root is normal in size and structure. IAS/Shunts: The interatrial septum was not assessed. Donato Schultz MD Electronically signed by Donato Schultz MD Signature Date/Time: 09/17/2019/3:37:42 PM    Final      PERTINENT LAB RESULTS: CBC: Recent Labs    09/18/19 0431 09/19/19 0450  WBC 5.6 4.6  HGB 10.0* 10.2*  HCT 32.5* 33.9*  PLT 219 236   CMET CMP     Component Value Date/Time   NA 139 09/19/2019 0450   NA 140 08/25/2016 1602   K 3.8 09/19/2019 0450   CL 109 09/19/2019 0450   CO2 24 09/19/2019 0450   GLUCOSE 102 (H) 09/19/2019 0450   BUN 7 09/19/2019 0450   BUN 6 08/25/2016 1602   CREATININE 0.63 09/19/2019 0450   CALCIUM 8.0 (L) 09/19/2019 0450   PROT 5.2 (L) 09/19/2019 0450   PROT 6.4 08/25/2016 1602   ALBUMIN 2.8 (L) 09/19/2019 0450   ALBUMIN 4.0 08/25/2016 1602   AST 33 09/19/2019 0450   ALT 32 09/19/2019 0450   ALKPHOS 44 09/19/2019 0450   BILITOT 0.4 09/19/2019 0450   BILITOT 0.3 08/25/2016 1602   GFRNONAA >60 09/19/2019 0450   GFRAA >60 09/19/2019 0450    GFR Estimated Creatinine Clearance: 74.5 mL/min (by C-G formula based on SCr of 0.63 mg/dL). No results for input(s): LIPASE, AMYLASE in the last 72 hours. No results for input(s): CKTOTAL, CKMB, CKMBINDEX, TROPONINI in the last 72 hours. Invalid input(s): POCBNP Recent Labs    09/17/19 0249 09/18/19 0431  DDIMER 0.30 0.40   No results for input(s): HGBA1C in the last 72 hours. No results for input(s): CHOL, HDL, LDLCALC, TRIG, CHOLHDL, LDLDIRECT in the last 72 hours. No results for input(s): TSH, T4TOTAL, T3FREE, THYROIDAB in the last 72 hours.  Invalid input(s): FREET3 Recent Labs    09/17/19 0249 09/18/19 0431  FERRITIN 17 18   Coags: No results for input(s): INR in the last 72 hours.  Invalid input(s): PT Microbiology: Recent Results (from the past 240  hour(s))  Respiratory Panel by RT PCR (Flu A&B, Covid) - Nasopharyngeal Swab     Status: Abnormal   Collection Time: 09/14/19 12:16 AM   Specimen: Nasopharyngeal Swab  Result Value Ref Range Status   SARS Coronavirus 2 by RT PCR POSITIVE (A) NEGATIVE Final    Comment: RESULT CALLED TO, READ BACK BY AND VERIFIED WITH: M DOSS,RN@[id number]  09/14/19 MKELLY (NOTE) SARS-CoV-2 target nucleic acids are DETECTED. SARS-CoV-2 RNA is generally detectable in upper respiratory specimens  during the acute phase of infection. Positive results are indicative of the presence of the identified virus, but do not rule out bacterial infection or co-infection with other pathogens not detected by the test. Clinical correlation with patient history and other diagnostic information is necessary to determine patient infection status. The expected result is Negative. Fact Sheet for Patients:  https://www.moore.com/ Fact Sheet for Healthcare Providers: https://www.young.biz/ This test is not yet approved or cleared by the [place] FDA and  has been authorized for detection and/or diagnosis of SARS-CoV-2 by FDA under an Emergency Use Authorization (EUA).  This EUA will remain in effect (meaning this test can be used) for th e duration of  the COVID-19 declaration under Section 564(b)(1) of the Act, 21 U.S.C. section 360bbb-3(b)(1), unless the authorization is terminated or revoked sooner.    Influenza A by PCR NEGATIVE NEGATIVE Final   Influenza B by PCR NEGATIVE NEGATIVE Final    Comment: (NOTE) The Xpert Xpress SARS-CoV-2/FLU/RSV assay is intended as an aid in  the diagnosis of influenza from Nasopharyngeal swab specimens and  should not be used as a sole basis for treatment. Nasal washings and  aspirates are unacceptable for Xpert Xpress SARS-CoV-2/FLU/RSV  testing. Fact Sheet for Patients: https://www.moore.com/ Fact Sheet for Healthcare  Providers: https://www.young.biz/ This test is not yet approved or cleared by the [place] FDA and  has been authorized for detection and/or diagnosis of SARS-CoV-2 by  FDA under an Emergency Use Authorization (EUA). This EUA will remain  in effect (meaning this test can be used) for the duration of the  Covid-19 declaration under Section 564(b)(1) of the Act, 21  U.S.C. section 360bbb-3(b)(1), unless the authorization is  terminated or revoked. Performed at [facility], [address] [place], [place] [address]     FURTHER DISCHARGE INSTRUCTIONS:  Get Medicines reviewed and adjusted: Please take all your medications with you for your next visit with your Primary MD  Laboratory/radiological data: Please request your Primary MD to go over all hospital tests and procedure/radiological results at the follow up, please ask your Primary MD to get all Hospital records sent to his/her office.  In some cases, they will be blood work, cultures and biopsy results pending at the time of your discharge. Please request that your primary care M.D. goes through all the records of your hospital data and follows up on these results.  Also Note the following: If you experience worsening of your admission symptoms, develop shortness of breath, life threatening emergency, suicidal or homicidal thoughts you must seek medical attention immediately by calling 911 or calling your MD immediately  if symptoms less severe.  You must read complete instructions/literature along with all the possible adverse reactions/side effects for all the Medicines you take and that have been prescribed to you. Take any new Medicines after you have completely understood and accpet all the possible adverse reactions/side effects.   Do not drive when taking Pain medications or sleeping medications (Benzodaizepines)  Do not take more than prescribed Pain, Sleep and Anxiety Medications. It is not advisable  to combine anxiety,sleep and pain medications without talking with your primary care practitioner  Special Instructions: If you have smoked or chewed Tobacco  in the last 2 yrs please stop smoking, stop any regular Alcohol  and or any Recreational drug use.  Wear Seat belts while driving.  Please note: You were cared for by a hospitalist during your hospital stay. Once you are discharged, your primary care physician will handle any  further medical issues. Please note that NO REFILLS for any discharge medications will be authorized once you are discharged, as it is imperative that you return to your primary care physician (or establish a relationship with a primary care physician if you do not have one) for your post hospital discharge needs so that they can reassess your need for medications and monitor your lab values.  Total Time spent coordinating discharge including counseling, education and face to face time equals 35 minutes.  SignedJeoffrey Massed 09/19/2019 11:02 AM

## 2019-09-19 NOTE — Care Management (Signed)
Pt deemed stable for discharge home today.   Pt received MATCH and [facility] will deliver discharge meds prior to pt leaving facility.  Pt request ride back to [facility] as her car is located at that hospital.  CM arranged transport via transport services.    Pt in agreement with CM arranging post discharge follow up appt with [facility] - See AVS

## 2019-09-19 NOTE — Progress Notes (Signed)
Madeline Davis to be D/C'd Home per MD order.  Discussed with the patient and all questions fully answered.  VSS, Skin clean, dry and intact without evidence of skin break down, no evidence of skin tears noted. IV catheter discontinued intact. Site without signs and symptoms of complications. Dressing and pressure applied.  An After Visit Summary was printed and given to the patient.   D/c education completed with patient including follow up instructions, medication list, d/c activities limitations if indicated, with other d/c instructions as indicated by MD - patient able to verbalize understanding, all questions fully answered.   Patient instructed to return to ED, call 911, or call MD for any changes in condition.   Patient escorted via WC, and D/C home via private auto.  Quincy Carnes 09/19/2019 3:05 PM

## 2019-09-19 NOTE — Progress Notes (Addendum)
Cardiology Progress Note  Patient ID: Madeline Davis MRN: [id number] DOB: 1990-09-15 Date of Encounter: 09/19/2019  Primary Cardiologist: Reatha Harps, MD  Subjective   Pt says she has some pain with movement but not as bad  Breathing is OK   Inpatient Medications  Scheduled Meds: . vitamin C  500 mg Oral Daily  . aspirin  81 mg Oral Daily  . colchicine  0.6 mg Oral Daily  . enoxaparin (LOVENOX) injection  40 mg Subcutaneous Q24H  . fluticasone  2 spray Each Nare Daily  . ibuprofen  400 mg Oral Q8H  . loratadine  10 mg Oral Daily  . pantoprazole  40 mg Oral Q1200  . sodium chloride flush  3 mL Intravenous Q12H  . sodium chloride flush  3 mL Intravenous Q12H  . zinc sulfate  220 mg Oral Daily   Continuous Infusions: . sodium chloride     PRN Meds: sodium chloride, acetaminophen **OR** acetaminophen, chlorpheniramine-HYDROcodone, guaiFENesin-dextromethorphan, Ipratropium-Albuterol, ondansetron **OR** ondansetron (ZOFRAN) IV, sodium chloride flush   Vital Signs   Vitals:   09/19/19 0004 09/19/19 0406 09/19/19 0729 09/19/19 0829  BP: (!) 93/53 (!) 96/55  (!) 94/50  Pulse: 86 77    Resp: 17 19    Temp: 97.8 F (36.6 C) 97.7 F (36.5 C) 98.1 F (36.7 C)   TempSrc: Oral Oral Oral   SpO2: 100% 100%    Weight:      Height:        Intake/Output Summary (Last 24 hours) at 09/19/2019 1032 Last data filed at 09/18/2019 2000 Gross per 24 hour  Intake 483 ml  Output 4 ml  Net 479 ml   Last 3 Weights 09/18/2019 09/14/2019 09/13/2019  Weight (lbs) 115 lb 115 lb 4.8 oz 117 lb  Weight (kg) 52.164 kg 52.3 kg 53.071 kg      Telemetry  SR   ECG   Pending  Physical Exam   Vitals:   09/19/19 0004 09/19/19 0406 09/19/19 0729 09/19/19 0829  BP: (!) 93/53 (!) 96/55  (!) 94/50  Pulse: 86 77    Resp: 17 19    Temp: 97.8 F (36.6 C) 97.7 F (36.5 C) 98.1 F (36.7 C)   TempSrc: Oral Oral Oral   SpO2: 100% 100%    Weight:      Height:         Intake/Output Summary (Last  24 hours) at 09/19/2019 1032 Last data filed at 09/18/2019 2000 Gross per 24 hour  Intake 483 ml  Output 4 ml  Net 479 ml    Last 3 Weights 09/18/2019 09/14/2019 09/13/2019  Weight (lbs) 115 lb 115 lb 4.8 oz 117 lb  Weight (kg) 52.164 kg 52.3 kg 53.071 kg    Body mass index is 22.52 kg/m.   Exam deferred to given COVID  Labs  High Sensitivity Troponin:   Recent Labs  Lab 09/15/19 1624 09/16/19 0448 09/16/19 0926 09/17/19 0834 09/17/19 1046  TROPONINIHS 2,723* 2,497* 2,368* 1,335* 1,408*     Cardiac EnzymesNo results for input(s): TROPONINI in the last 168 hours. No results for input(s): TROPIPOC in the last 168 hours.  Chemistry Recent Labs  Lab 09/17/19 0249 09/18/19 0431 09/19/19 0450  NA 137 139 139  K 3.9 3.7 3.8  CL 107 108 109  CO2 23 24 24   GLUCOSE 96 85 102*  BUN 5* 5* 7  CREATININE 0.71 0.67 0.63  CALCIUM 7.9* 7.7* 8.0*  PROT 5.5* 5.0* 5.2*  ALBUMIN 3.0* 2.7* 2.8*  AST 21 29 33  ALT 14 24 32  ALKPHOS 43 43 44  BILITOT 0.5 0.4 0.4  GFRNONAA >60 >60 >60  GFRAA >60 >60 >60  ANIONGAP 7 7 6     Hematology Recent Labs  Lab 09/17/19 0249 09/18/19 0431 09/19/19 0450  WBC 11.3* 5.6 4.6  RBC 4.29 3.79* 3.89  HGB 11.6* 10.0* 10.2*  HCT 36.9 32.5* 33.9*  MCV 86.0 85.8 87.1  MCH 27.0 26.4 26.2  MCHC 31.4 30.8 30.1  RDW 15.9* 16.1* 16.3*  PLT 210 219 236   BNP Recent Labs  Lab 09/13/19 1902  BNP 43.0    DDimer  Recent Labs  Lab 09/16/19 0448 09/17/19 0249 09/18/19 0431  DDIMER <0.27 0.30 0.40     Radiology  CARDIAC CATHETERIZATION  Result Date: 09/17/2019 Conclusions: 1. No angiographically significant coronary artery disease. 2. Normal left ventricular filling pressure. Recommendations: 1. Ongoing management of COVID-19 and likely myopericarditis contributing to chest pain and elevated troponin. 2. Primary prevention of coronary artery disease. 09/19/2019, MD [facility]   ECHOCARDIOGRAM LIMITED  Result Date: 09/17/2019     ECHOCARDIOGRAM LIMITED REPORT   Patient Name:   Madeline Davis Date of Exam: 09/17/2019 Medical Rec #:  09/19/2019   Height:       60.0 in Accession #:    [id number]  Weight:       115.3 lb Date of Birth:  Jan 02, 1991   BSA:          1.477 m Patient Age:    28 years    BP:           98/56 mmHg Patient Gender: F           HR:           93 bpm. Exam Location:  Inpatient Procedure: Limited Echo Indications:    NSTEMI I21.4  History:        Patient has prior history of Echocardiogram examinations, most                 recent 09/14/2019. Risk Factors:Current Smoker. Elevated                 troponin.  Sonographer:    09/16/2019 RDCS Referring Phys: Renella Cunas [facility] O'NEAL  Sonographer Comments: Covid positive. IMPRESSIONS  1. Left ventricular ejection fraction, by estimation, is 60 to 65%. The left ventricle has normal function. The left ventricle has no regional wall motion abnormalities.  2. Right ventricular systolic function is normal. The right ventricular size is normal.  3. The mitral valve is normal in structure.  4. The aortic valve is normal in structure. Comparison(s): Prior images reviewed side by side. The left ventricular function is unchanged. FINDINGS  Left Ventricle: Left ventricular ejection fraction, by estimation, is 60 to 65%. The left ventricle has normal function. The left ventricle has no regional wall motion abnormalities. The left ventricular internal cavity size was normal in size. There is  no left ventricular hypertrophy. Right Ventricle: The right ventricular size is normal. No increase in right ventricular wall thickness. Right ventricular systolic function is normal. Left Atrium: Left atrial size was normal in size. Right Atrium: Right atrial size was normal in size. Pericardium: There is no evidence of pericardial effusion. Mitral Valve: The mitral valve is normal in structure. Normal mobility of the mitral valve leaflets. Tricuspid Valve: The tricuspid valve is normal in structure. Aortic  Valve: The aortic valve is normal in structure. Pulmonic Valve: The pulmonic valve  was not assessed. Aorta: The aortic root is normal in size and structure. IAS/Shunts: The interatrial septum was not assessed. Candee Furbish MD Electronically signed by Candee Furbish MD Signature Date/Time: 09/17/2019/3:37:42 PM    Final     Cardiac Studies  TTE 09/14/2019 1. Left ventricular ejection fraction, by estimation, is 60 to 65%. The  left ventricle has normal function. The left ventricle has no regional  wall motion abnormalities. Left ventricular diastolic parameters were  normal.  2. Right ventricular systolic function is normal. The right ventricular  size is normal.  3. The mitral valve is normal in structure. Trivial mitral valve  regurgitation.  4. The aortic valve is tricuspid. Aortic valve regurgitation is not  visualized. No aortic stenosis is present.  5. The inferior vena cava is small suggestive of low RAP. \  Left heart catheterization  Conclusions: 1. No angiographically significant coronary artery disease. 2. Normal left ventricular filling pressure.  Recommendations: 1. Ongoing management of COVID-19 and likely myopericarditis contributing to chest pain and elevated troponin. 2. Primary prevention of coronary artery disease.      Patient Profile  Madeline Davis is a 29 y.o. female with asthma who was admitted 4/17 with covid URI symptoms and symptoms of CP with exertion, elevated troponin concerning for NSTEMI.   Assessment & Plan   1. Myopericarditis Pt notes some improvement with chest discomfort on current meds   I have reviewed rationale of colchicine and ibuprofen with her    Colchine for at least 3 months  Keep on motrin for now (with protonix) D/C metoprolol   Get EKG to follow progression   Will plan for outpt f/u in next week    OK to d/c home from cardiac standpoint   Provide office number [phone] for problems       Dorris Carnes MD 09/19/2019

## 2019-09-20 ENCOUNTER — Ambulatory Visit: Payer: No Typology Code available for payment source | Attending: Internal Medicine

## 2019-09-20 ENCOUNTER — Other Ambulatory Visit: Payer: Self-pay

## 2019-09-20 DIAGNOSIS — U071 COVID-19: Secondary | ICD-10-CM | POA: Insufficient documentation

## 2019-09-20 DIAGNOSIS — Z20822 Contact with and (suspected) exposure to covid-19: Secondary | ICD-10-CM

## 2019-09-21 LAB — NOVEL CORONAVIRUS, NAA: SARS-CoV-2, NAA: DETECTED — AB

## 2019-09-21 LAB — SARS-COV-2, NAA 2 DAY TAT

## 2019-09-30 NOTE — Progress Notes (Signed)
Virtual Visit via Video Note   This visit type was conducted due to national recommendations for restrictions regarding the COVID-19 Pandemic (e.g. social distancing) in an effort to limit this patient's exposure and mitigate transmission in our community.  Due to her co-morbid illnesses, this patient is at least at moderate risk for complications without adequate follow up.  This format is felt to be most appropriate for this patient at this time.  All issues noted in this document were discussed and addressed.  A limited physical exam was performed with this format.  Please refer to the patient's chart for her consent to telehealth for [facility].   The patient was identified using 2 identifiers.  Date:  10/01/2019   ID:  Brette Cast, DOB Mar 02, 1991, MRN [id number]  Patient Location: Home Provider Location: Other:  hospital  PCP:  Patient, No Pcp Per  Cardiologist:  Evalina Field, MD - but wants to follow up in [place]  Electrophysiologist:  None   Evaluation Performed:  Follow-Up Visit  Chief Complaint:  myopericarditis  History of Present Illness:    Madeline Davis is a 29 y.o. female with a PMH significant for asthma, tobacco abuse, and chronic constipation. She presented to [facility] with URI and exertional chest pain. She tested positive for COVID-19 (09/14/19) and was transferred to [facility] for further management of elevated troponin: HS troponin peaked at 2723. Echo showed normal LVEF and no WMA. EKG showed mild ST elevation, but did not meet criteria for STEMI.  LHC revealed no angiographically significant CAD. Myopericarditis was suspected. CRP 0.7 and ESR 5. She was started on colchicine x 3 months and ibuprofen with some improvement in symptoms.    She presents for follow up. Right radial cath site looks good. Some bruising in left arm IV sites. She continues to feel heart racing with walking, similar to her presentation, but this is improving. She denies chest pain.  She is still taking 400 mg ibuprofen every 6 hours. She is still taking colchicine 0.6 mg once daily. 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 Overall, she is improving, but still fatigued. I suspect his is due to her COVID-19 infection. She lives in [place] and would like to follow up out there.    The patient does have symptoms concerning for COVID-19 infection (fever, chills, cough, or new shortness of breath).    Past Medical History:  Diagnosis Date  . Asthma   . Constipation 08/12/2014  . Tobacco use   . Vitamin D deficiency 08/30/2016   Past Surgical History:  Procedure Laterality Date  . LEFT HEART CATH AND CORONARY ANGIOGRAPHY N/A 09/17/2019   Procedure: LEFT HEART CATH AND CORONARY ANGIOGRAPHY;  Surgeon: Nelva Bush, MD;  Location: [place] [facility];  Service: Cardiovascular;  Laterality: N/A;     Current Meds  Medication Sig  . albuterol (VENTOLIN HFA) 108 (90 Base) MCG/ACT inhaler Inhale 1-2 puffs into the lungs every 6 (six) hours as needed for wheezing or shortness of breath.  . colchicine 0.6 MG tablet Take 1 tablet (0.6 mg total) by mouth daily.  Marland Kitchen ibuprofen (ADVIL) 400 MG tablet Take 1 tablet (400 mg total) by mouth every 8 (eight) hours for 14 days. (Patient taking differently: Take 400 mg by mouth every 6 (six) hours. )  . pantoprazole (PROTONIX) 40 MG tablet Take 1 tablet (40 mg total) by mouth daily at 12 noon.     Allergies:   Patient has  no known allergies.   Social History   Tobacco Use  . Smoking status: Current Some Day Smoker    Packs/day: 0.25    Years: 8.00    Pack years: 2.00    Types: Cigarettes  . Smokeless tobacco: Never Used  Substance Use Topics  . Alcohol use: Yes    Comment: occasionally  . Drug use: Yes    Types: Marijuana    Comment: not since september of 2012     Family Hx: The patient's family history includes Alzheimer's disease in her maternal grandfather and maternal grandmother; Heart  attack (age of onset: 50) in her father; Hyperlipidemia in her father; Hypertension in her father and mother; Stroke in her father.  ROS:   Please see the history of present illness.     All other systems reviewed and are negative.   Prior CV studies:   The following studies were reviewed today:  Left heart cath 09/17/19 Conclusions: 1. No angiographically significant coronary artery disease. 2. Normal left ventricular filling pressure.  Recommendations: 1. Ongoing management of COVID-19 and likely myopericarditis contributing to chest pain and elevated troponin. 2. Primary prevention of coronary artery disease.  Echo 09/14/19 1. Left ventricular ejection fraction, by estimation, is 60 to 65%. The  left ventricle has normal function. The left ventricle has no regional  wall motion abnormalities. Left ventricular diastolic parameters were  normal.  2. Right ventricular systolic function is normal. The right ventricular  size is normal.  3. The mitral valve is normal in structure. Trivial mitral valve  regurgitation.  4. The aortic valve is tricuspid. Aortic valve regurgitation is not  visualized. No aortic stenosis is present.  5. The inferior vena cava is small suggestive of low RAP.   Labs/Other Tests and Data Reviewed:    EKG:  No ECG reviewed.  Recent Labs: 09/13/2019: B Natriuretic Peptide 43.0 09/18/2019: Magnesium 1.7 09/19/2019: ALT 32; BUN 7; Creatinine, Ser 0.63; Hemoglobin 10.2; Platelets 236; Potassium 3.8; Sodium 139   Recent Lipid Panel Lab Results  Component Value Date/Time   CHOL 150 09/16/2019 04:48 AM   CHOL 148 08/25/2016 04:02 PM   TRIG 139 09/16/2019 04:48 AM   HDL 21 (L) 09/16/2019 04:48 AM   HDL 35 (L) 08/25/2016 04:02 PM   CHOLHDL 7.1 09/16/2019 04:48 AM   LDLCALC 101 (H) 09/16/2019 04:48 AM   LDLCALC 100 (H) 08/25/2016 04:02 PM    Wt Readings from Last 3 Encounters:  09/14/19 115 lb 4.8 oz (52.3 kg)  09/18/19 115 lb (52.2 kg)  08/01/18  113 lb 8 oz (51.5 kg)     Objective:    Vital Signs:  BP 118/61   Pulse 82   Ht 5' (1.524 m)   LMP 09/06/2019   BMI 22.46 kg/m    VITAL SIGNS:  reviewed GEN:  no acute distress EYES:  sclerae anicteric, EOMI - Extraocular Movements Intact RESPIRATORY:  normal respiratory effort, symmetric expansion CARDIOVASCULAR:  no peripheral edema SKIN:  no rash, lesions or ulcers. MUSCULOSKELETAL:  no obvious deformities. NEURO:  alert and oriented x 3, no obvious focal deficit PSYCH:  normal affect Right radial cath site C/D/I, left arm IV sites bruised, no redness, no warmth per patient  ASSESSMENT & PLAN:    Myopericarditis - in the setting of URI/COVID-19 infection, hs troponin elevation and negative for CAD by heart cath - start weaning off ibuprofen tomorrow - continue colchicine x 3 months - continue protonix for now   Risk factor modification Current  smoker - given family history of premature heart disease, would focus on risk management - keep LDL below 100 - counseled to quit smoking   Follow up in 3 months in [place].   COVID-19 Education: The signs and symptoms of COVID-19 were discussed with the patient and how to seek care for testing (follow up with PCP or arrange E-visit).  The importance of social distancing was discussed today.  Time:   Today, I have spent 16 minutes with the patient with telehealth technology discussing the above problems.     Medication Adjustments/Labs and Tests Ordered: Current medicines are reviewed at length with the patient today.  Concerns regarding medicines are outlined above.   Tests Ordered: No orders of the defined types were placed in this encounter.   Medication Changes: No orders of the defined types were placed in this encounter.   Follow Up:  In Person in 3 month(s)  Signed, Ledora Bottcher, [place]  10/01/2019 9:46 AM    [place]

## 2019-10-01 ENCOUNTER — Telehealth (INDEPENDENT_AMBULATORY_CARE_PROVIDER_SITE_OTHER): Payer: Self-pay | Admitting: Physician Assistant

## 2019-10-01 ENCOUNTER — Encounter: Payer: Self-pay | Admitting: Physician Assistant

## 2019-10-01 VITALS — BP 118/61 | HR 82 | Ht 60.0 in

## 2019-10-01 DIAGNOSIS — E785 Hyperlipidemia, unspecified: Secondary | ICD-10-CM

## 2019-10-01 DIAGNOSIS — F17210 Nicotine dependence, cigarettes, uncomplicated: Secondary | ICD-10-CM

## 2019-10-01 DIAGNOSIS — I319 Disease of pericardium, unspecified: Secondary | ICD-10-CM

## 2019-10-01 DIAGNOSIS — Z79899 Other long term (current) drug therapy: Secondary | ICD-10-CM

## 2019-10-01 DIAGNOSIS — F17200 Nicotine dependence, unspecified, uncomplicated: Secondary | ICD-10-CM

## 2019-10-01 NOTE — Patient Instructions (Signed)
Medication Instructions:  Your physician recommends that you continue on your current medications as directed. Please refer to the Current Medication list given to you today.  *If you need a refill on your cardiac medications before your next appointment, please call your pharmacy*    Follow-Up: At [facility], you and your health needs are our priority.  As part of our continuing mission to provide you with exceptional heart care, we have created designated Provider Care Teams.  These Care Teams include your primary Cardiologist (physician) and Advanced Practice Providers (APPs -  Physician Assistants and Nurse Practitioners) who all work together to provide you with the care you need, when you need it.  We recommend signing up for the patient portal called "MyChart".  Sign up information is provided on this After Visit Summary.  MyChart is used to connect with patients for Virtual Visits (Telemedicine).  Patients are able to view lab/test results, encounter notes, upcoming appointments, etc.  Non-urgent messages can be sent to your provider as well.   To learn more about what you can do with MyChart, go to [email or web address].    Your next appointment:   3 month(s)  The format for your next appointment:   In Person  Provider:   Nona Dell, MD or other provider in [place] office.   Other Instructions Our office will call you to schedule your follow-up appointment.

## 2019-10-17 ENCOUNTER — Ambulatory Visit: Payer: Self-pay | Admitting: Family Medicine

## 2020-01-02 ENCOUNTER — Other Ambulatory Visit: Payer: No Typology Code available for payment source | Admitting: Adult Health

## 2020-02-22 ENCOUNTER — Telehealth: Payer: Self-pay | Admitting: Physician Assistant

## 2020-02-22 NOTE — Telephone Encounter (Signed)
Spoke with patient about scheduling follow up appointment from recall list patient stated she would call back to get scheduled once new insurance started

## 2020-03-24 ENCOUNTER — Telehealth: Payer: Self-pay | Admitting: Physician Assistant

## 2020-05-22 ENCOUNTER — Other Ambulatory Visit: Payer: Self-pay

## 2020-05-29 ENCOUNTER — Encounter (HOSPITAL_COMMUNITY): Payer: Self-pay | Admitting: *Deleted

## 2020-05-29 ENCOUNTER — Other Ambulatory Visit: Payer: Self-pay

## 2020-05-29 ENCOUNTER — Emergency Department (HOSPITAL_COMMUNITY)
Admission: EM | Admit: 2020-05-29 | Discharge: 2020-05-29 | Disposition: A | Discharge status: Home / Self Care | Payer: HRSA Program | Attending: Emergency Medicine | Admitting: Emergency Medicine

## 2020-05-29 DIAGNOSIS — J45901 Unspecified asthma with (acute) exacerbation: Secondary | ICD-10-CM | POA: Diagnosis not present

## 2020-05-29 DIAGNOSIS — U071 COVID-19: Secondary | ICD-10-CM | POA: Diagnosis not present

## 2020-05-29 DIAGNOSIS — Z955 Presence of coronary angioplasty implant and graft: Secondary | ICD-10-CM | POA: Diagnosis not present

## 2020-05-29 DIAGNOSIS — F17210 Nicotine dependence, cigarettes, uncomplicated: Secondary | ICD-10-CM | POA: Insufficient documentation

## 2020-05-29 DIAGNOSIS — R509 Fever, unspecified: Secondary | ICD-10-CM | POA: Diagnosis present

## 2020-05-29 LAB — BASIC METABOLIC PANEL
Anion gap: 9 (ref 5–15)
BUN: 5 mg/dL — ABNORMAL LOW (ref 6–20)
CO2: 26 mmol/L (ref 22–32)
Calcium: 8.8 mg/dL — ABNORMAL LOW (ref 8.9–10.3)
Chloride: 106 mmol/L (ref 98–111)
Creatinine, Ser: 0.65 mg/dL (ref 0.44–1.00)
GFR, Estimated: >60 mL/min (ref >60)
Glucose, Bld: 126 mg/dL — ABNORMAL HIGH (ref 70–99)
Potassium: 3.8 mmol/L (ref 3.5–5.1)
Sodium: 141 mmol/L (ref 135–145)

## 2020-05-29 LAB — CBC
HCT: 38.2 % (ref 36.0–46.0)
Hemoglobin: 12 g/dL (ref 12.0–15.0)
MCH: 28 pg (ref 26.0–34.0)
MCHC: 31.4 g/dL (ref 30.0–36.0)
MCV: 89 fL (ref 80.0–100.0)
Platelets: 340 10*3/uL (ref 150–400)
RBC: 4.29 MIL/uL (ref 3.87–5.11)
RDW: 16 % — ABNORMAL HIGH (ref 11.5–15.5)
WBC: 5.8 10*3/uL (ref 4.0–10.5)
nRBC: 0 % (ref 0.0–0.2)

## 2020-05-29 LAB — RESP PANEL BY RT-PCR (FLU A&B, COVID) ARPGX2
Influenza A by PCR: NEGATIVE
Influenza B by PCR: NEGATIVE
SARS Coronavirus 2 by RT PCR: POSITIVE — AB

## 2020-05-29 LAB — TROPONIN I (HIGH SENSITIVITY): Troponin I (High Sensitivity): 5 ng/L (ref <18)

## 2020-05-29 NOTE — Discharge Instructions (Addendum)
You were tested for COVID-19 today which was positive. You may take ibuprofen/Tylenol for body aches and fevers, drink plenty of fluids, over-the-counter cold and flu medications. You may also buy an over-the-counter pulse oximeter which can be found at your local pharmacy. If your oxygen saturation drops below 90%, please make sure to return to the ER. Return to the ER for any worsening shortness of breath.

## 2020-05-29 NOTE — ED Provider Notes (Signed)
[facility] EMERGENCY DEPARTMENT Provider Note   CSN: [id number] Arrival date & time: 05/29/20  1233     History Chief Complaint  Patient presents with  . Covid Exposure  . Chest Pain    Madeline Davis is a 29 y.o. female.  HPI 29 year-old female with fevers, body aches, cough, malaise who presents to the ER for Covid testing.  Patient states that she started to have the symptoms approximately 8 days ago, was sent to health network (patient is a [employer] employee disease and tested negative.  She states her symptoms have continued to worsen so.  She has had Covid in the past.  Per chart review, has had a history of myocarditis and elevated troponins in the setting of COVID-19.  Denies any chest pain currently.  No shortness of breath.  Like to have a Covid test.  No significant shortness of breath.  No abdominal pain, nausea, vomiting or diarrhea.  She is vaccinated but not boosted.    Past Medical History:  Diagnosis Date  . Asthma   . Constipation 08/12/2014  . Tobacco use   . Vitamin D deficiency 08/30/2016    Patient Active Problem List   Diagnosis Date Noted  . ACS (acute coronary syndrome) (HCC) 09/15/2019  . Viral syndrome 09/14/2019  . Elevated troponin 09/14/2019  . Hypokalemia 09/14/2019  . COVID-19 virus infection 09/14/2019  . Asthma exacerbation 09/14/2019  . Tobacco use 09/14/2019  . Vitamin D deficiency 08/30/2016  . Body aches 08/25/2016  . Fatigue 08/25/2016  . Constipation 08/12/2014    Past Surgical History:  Procedure Laterality Date  . LEFT HEART CATH AND CORONARY ANGIOGRAPHY N/A 09/17/2019   Procedure: LEFT HEART CATH AND CORONARY ANGIOGRAPHY;  Surgeon: [name], MD;  Location: [facility];  Service: Cardiovascular;  Laterality: N/A;     OB History    Gravida  1   Para      Term      Preterm      AB  1   Living  0     SAB  1   IAB      Ectopic      Multiple      Live Births              Family History   Problem Relation Age of Onset  . Hypertension Mother   . Hypertension Father   . Hyperlipidemia Father   . Stroke Father   . Heart attack Father 66  . Alzheimer's disease Maternal Grandmother   . Alzheimer's disease Maternal Grandfather     Social History   Tobacco Use  . Smoking status: Current Some Day Smoker    Packs/day: 0.25    Years: 8.00    Pack years: 2.00    Types: Cigarettes  . Smokeless tobacco: Never Used  Vaping Use  . Vaping Use: Never used  Substance Use Topics  . Alcohol use: Yes    Comment: occasionally  . Drug use: Yes    Types: Marijuana    Comment: not since september of 2012    Home Medications Prior to Admission medications   Medication Sig Start Date End Date Taking? Authorizing Provider  albuterol (VENTOLIN HFA) 108 (90 Base) MCG/ACT inhaler Inhale 1-2 puffs into the lungs every 6 (six) hours as needed for wheezing or shortness of breath.    [provider]  colchicine 0.6 MG tablet Take 1 tablet (0.6 mg total) by mouth daily. 09/20/19   Ghimire,  Werner Lean, MD  pantoprazole (PROTONIX) 40 MG tablet Take 1 tablet (40 mg total) by mouth daily at 12 noon. 09/19/19   Ghimire, Werner Lean, MD    Allergies    Patient has no known allergies.  Review of Systems   Review of Systems  Constitutional: Positive for activity change, appetite change, chills, fatigue and fever.  Respiratory: Positive for cough. Negative for shortness of breath.   Cardiovascular: Negative for chest pain.  Gastrointestinal: Negative for abdominal pain, diarrhea, nausea and vomiting.  Genitourinary: Negative for dysuria.  Neurological: Positive for weakness.    Physical Exam Updated Vital Signs BP 124/80 (BP Location: Right Arm)   Pulse 92   Temp 99.7 F (37.6 C) (Oral)   Resp 20   SpO2 100%   Physical Exam Vitals and nursing note reviewed.  Constitutional:      General: She is not in acute distress.    Appearance: Normal appearance. She is well-developed and  well-nourished. She is not ill-appearing or diaphoretic.  HENT:     Head: Normocephalic and atraumatic.  Eyes:     General:        Right eye: No discharge.        Left eye: No discharge.     Extraocular Movements: Extraocular movements intact.     Conjunctiva/sclera: Conjunctivae normal.  Cardiovascular:     Rate and Rhythm: Normal rate and regular rhythm.     Heart sounds: Normal heart sounds. No murmur heard.   Pulmonary:     Effort: Pulmonary effort is normal. No respiratory distress.     Breath sounds: Normal breath sounds. No decreased breath sounds or wheezing.  Abdominal:     Palpations: Abdomen is soft.     Tenderness: There is no abdominal tenderness.  Musculoskeletal:        General: No swelling or edema. Normal range of motion.     Cervical back: Neck supple.  Skin:    General: Skin is warm and dry.     Capillary Refill: Capillary refill takes less than 2 seconds.  Neurological:     General: No focal deficit present.     Mental Status: She is alert and oriented to person, place, and time.  Psychiatric:        Mood and Affect: Mood and affect and mood normal.        Behavior: Behavior normal.     ED Results / Procedures / Treatments   Labs (all labs ordered are listed, but only abnormal results are displayed) Labs Reviewed  RESP PANEL BY RT-PCR (FLU A&B, COVID) ARPGX2 - Abnormal; Notable for the following components:      Result Value   SARS Coronavirus 2 by RT PCR POSITIVE (*)    All other components within normal limits  BASIC METABOLIC PANEL - Abnormal; Notable for the following components:   Glucose, Bld 126 (*)    BUN 5 (*)    Calcium 8.8 (*)    All other components within normal limits  CBC - Abnormal; Notable for the following components:   RDW 16.0 (*)    All other components within normal limits  POC URINE PREG, ED  TROPONIN I (HIGH SENSITIVITY)  TROPONIN I (HIGH SENSITIVITY)    EKG EKG Interpretation  Date/Time:  Thursday May 29 2020  14:25:09 EST Ventricular Rate:  101 PR Interval:    QRS Duration: 64 QT Interval:  310 QTC Calculation: 401 R Axis:   68 Text Interpretation: Sinus tachycardia Nonspecific T wave  abnormality Abnormal ECG Since last tracing rate faster Otherwise no significant change Confirmed by Mancel Bale [phone]) on 05/29/2020 3:32:00 PM   Radiology No results found.  Procedures Procedures (including critical care time)  Medications Ordered in ED Medications - No data to display  ED Course  I have reviewed the triage vital signs and the nursing notes.  Pertinent labs & imaging results that were available during my care of the patient were reviewed by me and considered in my medical decision making (see chart for details).    MDM Rules/Calculators/A&P                          Patient with positive Covid test.  Labs reassuring.  Negative troponins..  Patient is afebrile, tolerating secretions. Normal O2 saturation.Pt will be discharged with symptomatic treatment, home isolation precautions. Will contact [facility] infusion clinic since COVID test is positive patient has a history of asthma. Return precautions discussed. Verbalizes understanding and is agreeable with plan. Pt is hemodynamically stable & in NAD prior to dc.  Final Clinical Impression(s) / ED Diagnoses Final diagnoses:  COVID-19    Rx / DC Orders ED Discharge Orders    None       Leone Brand 05/29/20 Abby Potash, MD 05/30/20 1520

## 2020-06-19 ENCOUNTER — Ambulatory Visit: Payer: Self-pay | Admitting: Nurse Practitioner

## 2020-06-21 NOTE — Progress Notes (Signed)
Cardiology Office Note:   Date:  06/24/2020  NAME:  Madeline Davis    MRN: [id number] DOB:  17-Mar-1991   PCP:  Patient, No Pcp Per  Cardiologist:  Evalina Field, MD   Referring MD: No ref. provider found   Chief Complaint  Patient presents with  . myocarditis   History of Present Illness:   Madeline Davis is a 30 y.o. female with a hx of covid myocarditis 08/2019 who presents for follow-up. Never had MRI.  She reports she was doing fairly well since April.  She had chest pain that lasted roughly 6 weeks after her hospitalization in April.  She completed colchicine and is stopped this.  She reports she had COVID-19 in December.  I suspect she had omicron in December and delta in April.  Symptoms included chills, aches, cough and shortness of breath.  Symptoms lasted roughly 9 days.  She reports she still gets short of breath with exertion.  She was seen in the emergency room and had a negative high-sensitivity troponin of 5.  Her EKG demonstrated normal sinus rhythm with nonspecific ST-T changes.  She did not have a chest x-ray.  She also reports that her heart increases whenever she does any exertion.  This is continued since her diagnosis of myocarditis and April 2021.  It is worsened over the last 1 month.  She reports when she exerts herself heart rate increases and it rapidly beats.  Symptoms do not occur at rest.  She needs a monitor.  She also needs updated lab work including a TSH.  She is smoking.  She is done so for roughly 8 years.  Smoking 4 to 5 cigarettes/day.  I encouraged her to quit.  She does report her mother has heart disease.  She does not drink alcohol in excess or use any drugs.  She works for [employer].  She has no evidence of heart failure examination.  Overall appears to be somewhat stable.  Problem List 1. 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 Anemia    Phreesia  06/16/2020  . Asthma   . Asthma    Phreesia 06/16/2020  . Constipation 08/12/2014  . Tobacco use   . Vitamin D deficiency 08/30/2016    Past Surgical History: Past Surgical History:  Procedure Laterality Date  . LEFT HEART CATH AND CORONARY ANGIOGRAPHY N/A 09/17/2019   Procedure: LEFT HEART CATH AND CORONARY ANGIOGRAPHY;  Surgeon: Nelva Bush, MD;  Location: [facility];  Service: Cardiovascular;  Laterality: N/A;    Current Medications: Current Meds  Medication Sig  . albuterol (VENTOLIN HFA) 108 (90 Base) MCG/ACT inhaler Inhale 1-2 puffs into the lungs every 6 (six) hours as needed for wheezing or shortness of breath.  . [DISCONTINUED] colchicine 0.6 MG tablet Take 1 tablet (0.6 mg total) by mouth daily.  . [DISCONTINUED] pantoprazole (PROTONIX) 40 MG tablet Take 1 tablet (40 mg total) by mouth daily at 12 noon.     Allergies:    Patient has no known allergies.   Social History: Social History   Socioeconomic History  . Marital status: Single    Spouse name: Not on file  . Number of children: Not on file  . Years of education: Not on file  . Highest education level: Not on file  Occupational History  . Not on file  Tobacco Use  . Smoking status: Current Some  Day Smoker    Packs/day: 0.25    Years: 8.00    Pack years: 2.00    Types: Cigarettes  . Smokeless tobacco: Never Used  Vaping Use  . Vaping Use: Never used  Substance and Sexual Activity  . Alcohol use: Yes    Comment: occasionally  . Drug use: Never    Comment: not since september of 2012  . Sexual activity: Yes    Birth control/protection: None    Comment: female partner  Other Topics Concern  . Not on file  Social History Narrative  . Not on file   Social Determinants of Health   Financial Resource Strain: Not on file  Food Insecurity: Not on file  Transportation Needs: Not on file  Physical Activity: Not on file  Stress: Not on file  Social Connections: Not on file     Family  History: The patient's family history includes Alzheimer's disease in her maternal grandfather and maternal grandmother; Heart attack (age of onset: 11) in her father; Heart disease in her mother; Hyperlipidemia in her father; Hypertension in her father and mother; Stroke in her father.  ROS:   All other ROS reviewed and negative. Pertinent positives noted in the HPI.     EKGs/Labs/Other Studies Reviewed:   The following studies were personally reviewed by me today:  EKG:  EKG from the emergency room on 06/02/2020 was reviewed in the office.  His EKG demonstrates sinus tachycardia heart rate 101 with nonspecific ST-T changes which is unchanged from prior EKGs in April 2021.  TTE 09/14/2019 1. Left ventricular ejection fraction, by estimation, is 60 to 65%. The  left ventricle has normal function. The left ventricle has no regional  wall motion abnormalities. Left ventricular diastolic parameters were  normal.  2. Right ventricular systolic function is normal. The right ventricular  size is normal.  3. The mitral valve is normal in structure. Trivial mitral valve  regurgitation.  4. The aortic valve is tricuspid. Aortic valve regurgitation is not  visualized. No aortic stenosis is present.  5. The inferior vena cava is small suggestive of low RAP.   Recent Labs: 09/13/2019: B Natriuretic Peptide 43.0 09/18/2019: Magnesium 1.7 09/19/2019: ALT 32 05/29/2020: BUN 5; Creatinine, Ser 0.65; Hemoglobin 12.0; Platelets 340; Potassium 3.8; Sodium 141   Recent Lipid Panel    Component Value Date/Time   CHOL 150 09/16/2019 0448   CHOL 148 08/25/2016 1602   TRIG 139 09/16/2019 0448   HDL 21 (L) 09/16/2019 0448   HDL 35 (L) 08/25/2016 1602   CHOLHDL 7.1 09/16/2019 0448   VLDL 28 09/16/2019 0448   LDLCALC 101 (H) 09/16/2019 0448   LDLCALC 100 (H) 08/25/2016 1602    Physical Exam:   VS:  BP (!) 147/96   Pulse 73   Ht 5' (1.524 m)   Wt 115 lb 3.2 oz (52.3 kg)   SpO2 100%   BMI 22.50  kg/m    Wt Readings from Last 3 Encounters:  06/24/20 115 lb 3.2 oz (52.3 kg)  09/18/19 115 lb (52.2 kg)  08/01/18 113 lb 8 oz (51.5 kg)    General: Well nourished, well developed, in no acute distress Head: Atraumatic, normal size  Eyes: PEERLA, EOMI  Neck: Supple, no JVD Endocrine: No thryomegaly Cardiac: Normal S1, S2; RRR; no murmurs, rubs, or gallops Lungs: Clear to auscultation bilaterally, no wheezing, rhonchi or rales  Abd: Soft, nontender, no hepatomegaly  Ext: No edema, pulses 2+ Musculoskeletal: No deformities, BUE and BLE strength  normal and equal Skin: Warm and dry, no rashes   Neuro: Alert and oriented to person, place, time, and situation, CNII-XII grossly intact, no focal deficits  Psych: Normal mood and affect   ASSESSMENT:   Madeline Davis is a 30 y.o. female who presents for the following: 1. Viral myocarditis, unspecified chronicity   2. SOB (shortness of breath)   3. Palpitations   4. Tobacco abuse     PLAN:   1. Viral myocarditis, unspecified chronicity -Diagnosed with COVID-19 myocarditis in April 2021.  Ejection fraction 60-65%.  Left heart catheterization with normal coronary arteries.  She did have a peak elevated troponin up around 2700.  EKG demonstrated sinus rhythm with nonspecific ST-T changes at that time. -She did not undergo cardiac MRI. -She was treated with colchicine and her chest pain symptoms have improved. -She never had a drop in her ejection fraction.  Her palpitations are alarming.  She will need to proceed with a 3-day Zio patch to exclude any arrhythmias.  It is reassuring that her most recent-7 troponin in the emergency room was 5.  This is negative and normal.  This is further reassuring that she does not have continued inflammation in her heart.  2. SOB (shortness of breath) 3. Palpitations -Shortness of breath with exertion and palpitations since having Covid again in December.  Suspect she had only from December and delta in April.   I would like to proceed with a TSH and chest x-ray.  I would like to repeat an echocardiogram given her history of COVID-19 myocarditis.  We will pursue strain imaging as well.  Recent high-sensitivity troponin in the emergency room with a value of 5 which is negative for any heart damage.  This is reassuring she does not have a recurrent episode of myocarditis.  She also describes no chest pain.  We also will proceed with 3-day Zio patch as above to exclude any arrhythmias.  EKG from her recent emergency room visit demonstrates sinus rhythm with nonspecific ST-T changes which is unchanged from prior EKGs.  This is also reassuring.  4. Tobacco abuse -Smoking up to 5 cigarettes/day.  Advised to quit.  Disposition: Return in about 3 months (around 09/22/2020).  Medication Adjustments/Labs and Tests Ordered: Current medicines are reviewed at length with the patient today.  Concerns regarding medicines are outlined above.  Orders Placed This Encounter  Procedures  . DG Chest 2 View  . TSH  . LONG TERM MONITOR (3-14 DAYS)  . ECHOCARDIOGRAM COMPLETE   No orders of the defined types were placed in this encounter.   Patient Instructions  Medication Instructions:  The current medical regimen is effective;  continue present plan and medications.  *If you need a refill on your cardiac medications before your next appointment, please call your pharmacy*  Lab: TSH today   Testing/Procedures: Chest xray - Your physician has requested that you have a chest xray, is a fast and painless imaging test that uses certain electromagnetic waves to create pictures of the structures in and around your chest. This test can help diagnose and monitor conditions such as pneumonia and other lung issues his will be done at [facility], [place]. If you should need to call them their phone number is [phone].  Echocardiogram - Your physician has requested that you have an echocardiogram.  Echocardiography is a painless test that uses sound waves to create images of your heart. It provides your doctor with information about the size and shape  of your heart and how well your heart's chambers and valves are working. This procedure takes approximately one hour. There are no restrictions for this procedure. This will be performed at our [facility] location - [address].  ZIO XT- Long Term Monitor Instructions   Your physician has requested you wear your ZIO patch monitor____3___days.   This is a single patch monitor.  [employer] supplies one patch monitor per enrollment.  Additional stickers are not available.   Please do not apply patch if you will be having a Nuclear Stress Test, Echocardiogram, Cardiac CT, MRI, or Chest Xray during the time frame you would be wearing the monitor. The patch cannot be worn during these tests.  You cannot remove and re-apply the ZIO XT patch monitor.   Your ZIO patch monitor will be sent USPS Priority mail from [facility] directly to your home address. The monitor may also be mailed to a PO BOX if home delivery is not available.   It may take 3-5 days to receive your monitor after you have been enrolled.   Once you have received you monitor, please review enclosed instructions.  Your monitor has already been registered assigning a specific monitor serial # to you.   Applying the monitor   Shave hair from upper left chest.   Hold abrader disc by orange tab.  Rub abrader in 40 strokes over left upper chest as indicated in your monitor instructions.   Clean area with 4 enclosed alcohol pads .  Use all pads to assure are is cleaned thoroughly.  Let dry.   Apply patch as indicated in monitor instructions.  Patch will be place under collarbone on left side of chest with arrow pointing upward.   Rub patch adhesive wings for 2 minutes.Remove white label marked "1".  Remove white label marked "2".  Rub patch adhesive wings for 2  additional minutes.   While looking in a mirror, press and release button in center of patch.  A small green light will flash 3-4 times .  This will be your only indicator the monitor has been turned on.     Do not shower for the first 24 hours.  You may shower after the first 24 hours.   Press button if you feel a symptom. You will hear a small click.  Record Date, Time and Symptom in the Patient Log Book.   When you are ready to remove patch, follow instructions on last 2 pages of Patient Log Book.  Stick patch monitor onto last page of Patient Log Book.   Place Patient Log Book in [place] box.  Use locking tab on box and tape box closed securely.  The [employer] and [employer] has [employer] on it.  Please place in mailbox as soon as possible.  Your physician should have your test results approximately 7 days after the monitor has been mailed back to [facility].   Call [facility] at [phone] if you have questions regarding your ZIO XT patch monitor.  Call them immediately if you see an orange light blinking on your monitor.   If your monitor falls off in less than 4 days contact our Monitor department at [phone].  If your monitor becomes loose or falls off after 4 days call [employer] at [phone] for suggestions on securing your monitor.     Follow-Up: At [facility], you and your health needs are our priority.  As part of our continuing mission to  provide you with exceptional heart care, we have created designated Provider Care Teams.  These Care Teams include your primary Cardiologist (physician) and Advanced Practice Providers (APPs -  Physician Assistants and Nurse Practitioners) who all work together to provide you with the care you need, when you need it.  We recommend signing up for the patient portal called "MyChart".  Sign up information is provided on this After Visit Summary.  MyChart is used to connect with patients for Virtual Visits  (Telemedicine).  Patients are able to view lab/test results, encounter notes, upcoming appointments, etc.  Non-urgent messages can be sent to your provider as well.   To learn more about what you can do with MyChart, go to [email or web address].    Your next appointment:   3 month(s)  The format for your next appointment:   In Person  Provider:   Eleonore Chiquito, MD         Time Spent with Patient: I have spent a total of 35 minutes with patient reviewing hospital notes, telemetry, EKGs, labs and examining the patient as well as establishing an assessment and plan that was discussed with the patient.  > 50% of time was spent in direct patient care.  Signed, Addison Naegeli. Audie Box, [place]  [address] [place], [place] [address] [phone]  06/24/2020 5:29 PM

## 2020-06-24 ENCOUNTER — Encounter: Payer: Self-pay | Admitting: Cardiovascular Disease

## 2020-06-24 ENCOUNTER — Other Ambulatory Visit: Payer: Self-pay

## 2020-06-24 ENCOUNTER — Ambulatory Visit (INDEPENDENT_AMBULATORY_CARE_PROVIDER_SITE_OTHER): Payer: Self-pay | Admitting: Cardiovascular Disease

## 2020-06-24 VITALS — BP 147/96 | HR 73 | Ht 60.0 in | Wt 115.2 lb

## 2020-06-24 DIAGNOSIS — R002 Palpitations: Secondary | ICD-10-CM

## 2020-06-24 DIAGNOSIS — B3322 Viral myocarditis: Secondary | ICD-10-CM

## 2020-06-24 DIAGNOSIS — R0602 Shortness of breath: Secondary | ICD-10-CM

## 2020-06-24 DIAGNOSIS — Z720 Tobacco use: Secondary | ICD-10-CM

## 2020-06-24 NOTE — Patient Instructions (Signed)
Medication Instructions:  The current medical regimen is effective;  continue present plan and medications.  *If you need a refill on your cardiac medications before your next appointment, please call your pharmacy*  Lab: TSH today   Testing/Procedures: Chest xray - Your physician has requested that you have a chest xray, is a fast and painless imaging test that uses certain electromagnetic waves to create pictures of the structures in and around your chest. This test can help diagnose and monitor conditions such as pneumonia and other lung issues his will be done at [facility]  Imaging [address], [place]. If you should need to call them their phone number is [phone].  Echocardiogram - Your physician has requested that you have an echocardiogram. Echocardiography is a painless test that uses sound waves to create images of your heart. It provides your doctor with information about the size and shape of your heart and how well your heart's chambers and valves are working. This procedure takes approximately one hour. There are no restrictions for this procedure. This will be performed at our [facility] location - [address], [address].  ZIO XT- Long Term Monitor Instructions   Your physician has requested you wear your ZIO patch monitor____3___days.   This is a single patch monitor.  [employer] supplies one patch monitor per enrollment.  Additional stickers are not available.   Please do not apply patch if you will be having a Nuclear Stress Test, Echocardiogram, Cardiac CT, MRI, or Chest Xray during the time frame you would be wearing the monitor. The patch cannot be worn during these tests.  You cannot remove and re-apply the ZIO XT patch monitor.   Your ZIO patch monitor will be sent USPS Priority mail from [facility] directly to your home address. The monitor may also be mailed to a PO BOX if home delivery is not available.   It may take 3-5 days to receive your  monitor after you have been enrolled.   Once you have received you monitor, please review enclosed instructions.  Your monitor has already been registered assigning a specific monitor serial # to you.   Applying the monitor   Shave hair from upper left chest.   Hold abrader disc by orange tab.  Rub abrader in 40 strokes over left upper chest as indicated in your monitor instructions.   Clean area with 4 enclosed alcohol pads .  Use all pads to assure are is cleaned thoroughly.  Let dry.   Apply patch as indicated in monitor instructions.  Patch will be place under collarbone on left side of chest with arrow pointing upward.   Rub patch adhesive wings for 2 minutes.Remove white label marked "1".  Remove white label marked "2".  Rub patch adhesive wings for 2 additional minutes.   While looking in a mirror, press and release button in center of patch.  A small green light will flash 3-4 times .  This will be your only indicator the monitor has been turned on.     Do not shower for the first 24 hours.  You may shower after the first 24 hours.   Press button if you feel a symptom. You will hear a small click.  Record Date, Time and Symptom in the Patient Log Book.   When you are ready to remove patch, follow instructions on last 2 pages of Patient Log Book.  Stick patch monitor onto last page of Patient Log Book.   Place Patient  Log Book in [facility] box.  Use locking tab on box and tape box closed securely.  The [employer] and [employer] has [employer] on it.  Please place in mailbox as soon as possible.  Your physician should have your test results approximately 7 days after the monitor has been mailed back to [facility].   Call [facility] Customer Care at [phone] if you have questions regarding your ZIO XT patch monitor.  Call them immediately if you see an orange light blinking on your monitor.   If your monitor falls off in less than 4 days contact our Monitor department at  [phone].  If your monitor becomes loose or falls off after 4 days call [employer] at [phone] for suggestions on securing your monitor.     Follow-Up: At [facility], you and your health needs are our priority.  As part of our continuing mission to provide you with exceptional heart care, we have created designated Provider Care Teams.  These Care Teams include your primary Cardiologist (physician) and Advanced Practice Providers (APPs -  Physician Assistants and Nurse Practitioners) who all work together to provide you with the care you need, when you need it.  We recommend signing up for the patient portal called "MyChart".  Sign up information is provided on this After Visit Summary.  MyChart is used to connect with patients for Virtual Visits (Telemedicine).  Patients are able to view lab/test results, encounter notes, upcoming appointments, etc.  Non-urgent messages can be sent to your provider as well.   To learn more about what you can do with MyChart, go to [email or web address].    Your next appointment:   3 month(s)  The format for your next appointment:   In Person  Provider:   Lennie Odor, MD

## 2020-06-25 ENCOUNTER — Encounter: Payer: Self-pay | Admitting: *Deleted

## 2020-06-25 ENCOUNTER — Ambulatory Visit (INDEPENDENT_AMBULATORY_CARE_PROVIDER_SITE_OTHER): Payer: No Typology Code available for payment source

## 2020-06-25 DIAGNOSIS — R002 Palpitations: Secondary | ICD-10-CM

## 2020-06-25 NOTE — Progress Notes (Signed)
Patient ID: Madeline Davis, female   DOB: 01-17-1991, 30 y.o.   MRN: [id number] Patient enrolled for [employer] to ship a 3 day ZIO XT long term holter monitor to her home.

## 2020-06-30 ENCOUNTER — Encounter: Payer: Self-pay | Admitting: Nurse Practitioner

## 2020-06-30 ENCOUNTER — Ambulatory Visit (INDEPENDENT_AMBULATORY_CARE_PROVIDER_SITE_OTHER): Payer: No Typology Code available for payment source | Admitting: Nurse Practitioner

## 2020-06-30 ENCOUNTER — Other Ambulatory Visit: Payer: Self-pay

## 2020-06-30 DIAGNOSIS — E559 Vitamin D deficiency, unspecified: Secondary | ICD-10-CM

## 2020-06-30 DIAGNOSIS — Z7689 Persons encountering health services in other specified circumstances: Secondary | ICD-10-CM | POA: Diagnosis not present

## 2020-06-30 DIAGNOSIS — U071 COVID-19: Secondary | ICD-10-CM | POA: Diagnosis not present

## 2020-06-30 DIAGNOSIS — J4530 Mild persistent asthma, uncomplicated: Secondary | ICD-10-CM

## 2020-06-30 DIAGNOSIS — R5383 Other fatigue: Secondary | ICD-10-CM

## 2020-06-30 DIAGNOSIS — Z139 Encounter for screening, unspecified: Secondary | ICD-10-CM | POA: Insufficient documentation

## 2020-06-30 NOTE — Assessment & Plan Note (Signed)
-  will check Vit D level with next set of labs

## 2020-06-30 NOTE — Assessment & Plan Note (Signed)
-  uses albuterol inhaler PRN

## 2020-06-30 NOTE — Assessment & Plan Note (Signed)
-  will screen for HCV 

## 2020-06-30 NOTE — Patient Instructions (Signed)
It was great meeting you today.  We will get together in 2 weeks for a physical exam.  Please have fasting labs drawn 2-3 days prior to your appointment.

## 2020-06-30 NOTE — Progress Notes (Signed)
New Patient Office Visit  Subjective:  Patient ID: Madeline Davis, female    DOB: Jan 19, 1991  Age: 30 y.o. MRN: [id number]  CC:  Chief Complaint  Patient presents with  . New Patient (Initial Visit)    HPI Madeline Davis presents for new patient visit. No recent PCP, labs, or physical  Today, she states she had COVID in April 2021, and she is seeing a cardiologist for this.  She had COVID again in Dec 2021.  She still has some fatigue.  Past Medical History:  Diagnosis Date  . Anemia    Phreesia 06/16/2020  . Asthma   . Asthma    Phreesia 06/16/2020  . Constipation 08/12/2014  . COVID    followed by cardiology now Madeline Davis in [place])  . Tobacco use   . Vitamin D deficiency 08/30/2016    Past Surgical History:  Procedure Laterality Date  . LEFT HEART CATH AND CORONARY ANGIOGRAPHY N/A 09/17/2019   Procedure: LEFT HEART CATH AND CORONARY ANGIOGRAPHY;  Surgeon: Nelva Bush, MD;  Location: [facility];  Service: Cardiovascular;  Laterality: N/A;    Family History  Problem Relation Age of Onset  . Hypertension Mother   . Heart disease Mother   . Hypertension Father   . Hyperlipidemia Father   . Stroke Father   . Heart attack Father 96  . Alzheimer's disease Maternal Grandmother   . Alzheimer's disease Maternal Grandfather     Social History   Socioeconomic History  . Marital status: Single    Spouse name: Not on file  . Number of children: Not on file  . Years of education: Not on file  . Highest education level: Not on file  Occupational History  . 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 Smoking status: Current Some Day Smoker    Packs/day: 0.25    Years: 13.00    Pack years: 3.25    Types: Cigarettes  . Smokeless tobacco: Never Used  Vaping Use  . Vaping Use: Never used  Substance and Sexual Activity  . Alcohol use: Yes    Comment: occasionally-2-3 glasses of wine on weekends  . Drug use: Never    Comment: not since  september of 2012  . Sexual activity: Yes    Birth control/protection: None    Comment: female partner  Other Topics Concern  . Not on file  Social History Narrative  . Not on file   Social Determinants of Health   Financial Resource Strain: Not on file  Food Insecurity: Not on file  Transportation Needs: Not on file  Physical Activity: Not on file  Stress: Not on file  Social Connections: Not on file  Intimate Partner Violence: Not on file    ROS Review of Systems  Constitutional: Positive for fatigue. Negative for chills and fever.  Respiratory: Negative.   Cardiovascular: Negative.   Psychiatric/Behavioral: Negative.     Objective:   Today's Vitals: BP 122/74   Pulse 89   Temp 99.2 F (37.3 C)   Resp 16   Ht 5' (1.524 m)   Wt 114 lb (51.7 kg)   SpO2 95%   BMI 22.26 kg/m   Physical Exam Constitutional:      Appearance: Normal appearance.  Cardiovascular:     Rate and Rhythm: Normal rate and regular rhythm.     Pulses: Normal pulses.     Heart sounds: Normal heart sounds.  Pulmonary:     Effort: Pulmonary  effort is normal.     Breath sounds: Normal breath sounds.  Neurological:     Mental Status: She is alert.  Psychiatric:        Mood and Affect: Mood normal.        Behavior: Behavior normal.        Thought Content: Thought content normal.        Judgment: Judgment normal.     Assessment & Plan:   Problem List Items Addressed This Visit      Respiratory   Asthma    -uses albuterol inhaler PRN         Other   Fatigue    -will check thyroid function with next set of labs -had myocarditis after 2nd COVID infection in Dec2021      Relevant Orders   TSH + free T4   Vitamin D deficiency    -will check Vit D level with next set of labs      Relevant Orders   Vitamin D (25 hydroxy)   COVID-19 virus infection    -myocarditis after COVID infection      Encounter to establish care    -no recent PCP -[facility] for GYN/PAP exams       Relevant Orders   CBC with Differential/Platelet   CMP14+EGFR   HCV Ab w/Rflx to Verification   Hemoglobin A1c   Lipid Panel With LDL/HDL Ratio   TSH + free T4   Vitamin D (25 hydroxy)   Screening due    -will screen for HCV      Relevant Orders   HCV Ab w/Rflx to Verification   Hemoglobin A1c      Outpatient Encounter Medications as of 06/30/2020  Medication Sig  . albuterol (VENTOLIN HFA) 108 (90 Base) MCG/ACT inhaler Inhale 1-2 puffs into the lungs every 6 (six) hours as needed for wheezing or shortness of breath.   No facility-administered encounter medications on file as of 06/30/2020.   Date:  07/03/2020   Location of Patient: Home Location of Provider: Office Consent was obtain for visit to be over via telehealth. I verified that I am speaking with the correct person using two identifiers.  I connected with  Madeline Davis on 07/03/20 via telephone and verified that I am speaking with the correct person using two identifiers.   I discussed the limitations of evaluation and management by telemedicine. The patient expressed understanding and agreed to proceed.  Time spent: 9 min   Follow-up: Return in about 2 weeks (around 07/14/2020) for Physical Exam.   Noreene Larsson, NP

## 2020-06-30 NOTE — Assessment & Plan Note (Signed)
-  no recent PCP -[facility] for GYN/PAP exams

## 2020-06-30 NOTE — Assessment & Plan Note (Signed)
-  will check thyroid function with next set of labs -had myocarditis after 2nd COVID infection in Dec2021

## 2020-06-30 NOTE — Assessment & Plan Note (Signed)
-  myocarditis after COVID infection

## 2020-07-02 DIAGNOSIS — R002 Palpitations: Secondary | ICD-10-CM | POA: Diagnosis not present

## 2020-07-03 ENCOUNTER — Other Ambulatory Visit: Payer: Self-pay

## 2020-07-03 ENCOUNTER — Ambulatory Visit (HOSPITAL_COMMUNITY)
Admission: RE | Admit: 2020-07-03 | Discharge: 2020-07-03 | Disposition: A | Discharge status: Home / Self Care | Payer: No Typology Code available for payment source | Source: Ambulatory Visit | Attending: Cardiovascular Disease | Admitting: Cardiovascular Disease

## 2020-07-03 DIAGNOSIS — R002 Palpitations: Secondary | ICD-10-CM | POA: Diagnosis present

## 2020-07-03 DIAGNOSIS — R0602 Shortness of breath: Secondary | ICD-10-CM | POA: Diagnosis present

## 2020-07-03 LAB — ECHOCARDIOGRAM COMPLETE
Area-P 1/2: 2.77 cm2
S' Lateral: 2.3 cm

## 2020-07-03 NOTE — Progress Notes (Signed)
*  PRELIMINARY RESULTS* Echocardiogram 2D Echocardiogram has been performed.  Stacey Drain 07/03/2020, 3:58 PM

## 2020-07-15 ENCOUNTER — Encounter: Payer: No Typology Code available for payment source | Admitting: Nurse Practitioner

## 2020-07-23 ENCOUNTER — Other Ambulatory Visit: Payer: Self-pay | Admitting: Adult Health

## 2020-09-07 NOTE — Progress Notes (Deleted)
Cardiology Office Note:   Date:  09/07/2020  NAME:  Madeline Davis    MRN: [id number] DOB:  09-17-1990   PCP:  Noreene Larsson, NP  Cardiologist:  Evalina Field, MD  Electrophysiologist:  None   Referring MD: No ref. provider found   No chief complaint on file. ***  History of Present Illness:   Madeline Davis is a 30 y.o. female with a hx of covid 19 myocarditis who presents for follow-up. Seen in February with complaints of palpitations. Monitor normal. Repeat echo normal.    Problem List 1. 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 Anemia    Phreesia 06/16/2020  . Asthma   . Asthma    Phreesia 06/16/2020  . Constipation 08/12/2014  . COVID    followed by cardiology now Davina Poke in [place])  . Tobacco use   . Vitamin D deficiency 08/30/2016    Past Surgical History: Past Surgical History:  Procedure Laterality Date  . LEFT HEART CATH AND CORONARY ANGIOGRAPHY N/A 09/17/2019   Procedure: LEFT HEART CATH AND CORONARY ANGIOGRAPHY;  Surgeon: Nelva Bush, MD;  Location: [facility];  Service: Cardiovascular;  Laterality: N/A;    Current Medications: No outpatient medications have been marked as taking for the 09/09/20 encounter (Appointment) with O'Neal, Cassie Freer, MD.     Allergies:    Patient has no known allergies.   Social History: Social History   Socioeconomic History  . Marital status: Single    Spouse name: Not on file  . Number of children: Not on file  . Years of education: Not on file  . Highest education level: Not on file  Occupational History  . 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 Smoking status: Current Some Day Smoker    Packs/day: 0.25    Years: 13.00    Pack years: 3.25    Types: Cigarettes  . Smokeless tobacco: Never Used  Vaping Use  . Vaping Use: Never used  Substance and Sexual Activity  . Alcohol  use: Yes    Comment: occasionally-2-3 glasses of wine on weekends  . Drug use: Never    Comment: not since september of 2012  . Sexual activity: Yes    Birth control/protection: None    Comment: female partner  Other Topics Concern  . Not on file  Social History Narrative  . Not on file   Social Determinants of Health   Financial Resource Strain: Not on file  Food Insecurity: Not on file  Transportation Needs: Not on file  Physical Activity: Not on file  Stress: Not on file  Social Connections: Not on file     Family History: The patient's ***family history includes Alzheimer's disease in her maternal grandfather and maternal grandmother; Heart attack (age of onset: 48) in her father; Heart disease in her mother; Hyperlipidemia in her father; Hypertension in her father and mother; Stroke in her father.  ROS:   All other ROS reviewed and negative. Pertinent positives noted in the HPI.     EKGs/Labs/Other Studies Reviewed:   The following studies were personally reviewed by me today:  EKG:  EKG is *** ordered today.  The ekg ordered today demonstrates ***, and was personally reviewed by me.   TTE 07/03/2020 1. Left ventricular ejection fraction, by estimation, is 70 to 75%. The  left ventricle has hyperdynamic  function. The left ventricle has no  regional wall motion abnormalities. Left ventricular diastolic parameters  were normal.  2. Right ventricular systolic function is normal. The right ventricular  size is normal. Tricuspid regurgitation signal is inadequate for assessing  PA pressure.  3. The mitral valve is grossly normal. Mild mitral valve regurgitation.  4. The aortic valve is tricuspid. Aortic valve regurgitation is not  visualized.  5. The inferior vena cava is normal in size with greater than 50%  respiratory variability, suggesting right atrial pressure of 3 mmHg.   Zio 08/17/2020  Impression: 1. No arrhythmias detected.  2. Rare ectopy.    Recent  Labs: 09/13/2019: B Natriuretic Peptide 43.0 09/18/2019: Magnesium 1.7 09/19/2019: ALT 32 05/29/2020: BUN 5; Creatinine, Ser 0.65; Hemoglobin 12.0; Platelets 340; Potassium 3.8; Sodium 141   Recent Lipid Panel    Component Value Date/Time   CHOL 150 09/16/2019 0448   CHOL 148 08/25/2016 1602   TRIG 139 09/16/2019 0448   HDL 21 (L) 09/16/2019 0448   HDL 35 (L) 08/25/2016 1602   CHOLHDL 7.1 09/16/2019 0448   VLDL 28 09/16/2019 0448   LDLCALC 101 (H) 09/16/2019 0448   LDLCALC 100 (H) 08/25/2016 1602    Physical Exam:   VS:  There were no vitals taken for this visit.   Wt Readings from Last 3 Encounters:  06/30/20 114 lb (51.7 kg)  06/24/20 115 lb 3.2 oz (52.3 kg)  09/18/19 115 lb (52.2 kg)    General: Well nourished, well developed, in no acute distress Head: Atraumatic, normal size  Eyes: PEERLA, EOMI  Neck: Supple, no JVD Endocrine: No thryomegaly Cardiac: Normal S1, S2; RRR; no murmurs, rubs, or gallops Lungs: Clear to auscultation bilaterally, no wheezing, rhonchi or rales  Abd: Soft, nontender, no hepatomegaly  Ext: No edema, pulses 2+ Musculoskeletal: No deformities, BUE and BLE strength normal and equal Skin: Warm and dry, no rashes   Neuro: Alert and oriented to person, place, time, and situation, CNII-XII grossly intact, no focal deficits  Psych: Normal mood and affect   ASSESSMENT:   Madeline Davis is a 30 y.o. female who presents for the following: No diagnosis found.  PLAN:   There are no diagnoses linked to this encounter.  Disposition: No follow-ups on file.  Medication Adjustments/Labs and Tests Ordered: Current medicines are reviewed at length with the patient today.  Concerns regarding medicines are outlined above.  No orders of the defined types were placed in this encounter.  No orders of the defined types were placed in this encounter.   There are no Patient Instructions on file for this visit.   Time Spent with Patient: I have spent a total of  *** minutes with patient reviewing hospital notes, telemetry, EKGs, labs and examining the patient as well as establishing an assessment and plan that was discussed with the patient.  > 50% of time was spent in direct patient care.  Signed, Addison Naegeli. Audie Box, MD, [facility]  [address] [place],  [address] [phone]  09/07/2020 7:09 PM

## 2020-09-09 ENCOUNTER — Ambulatory Visit: Payer: Self-pay | Admitting: Cardiovascular Disease

## 2020-09-09 DIAGNOSIS — R0602 Shortness of breath: Secondary | ICD-10-CM

## 2020-09-09 DIAGNOSIS — B3322 Viral myocarditis: Secondary | ICD-10-CM

## 2020-09-09 DIAGNOSIS — Z720 Tobacco use: Secondary | ICD-10-CM

## 2020-09-09 DIAGNOSIS — R002 Palpitations: Secondary | ICD-10-CM

## 2020-09-18 ENCOUNTER — Other Ambulatory Visit: Payer: Self-pay | Admitting: Adult Health

## 2020-11-04 ENCOUNTER — Other Ambulatory Visit: Payer: Self-pay

## 2020-11-04 ENCOUNTER — Other Ambulatory Visit (HOSPITAL_COMMUNITY)
Admission: RE | Admit: 2020-11-04 | Discharge: 2020-11-04 | Disposition: A | Discharge status: Home / Self Care | Payer: No Typology Code available for payment source | Source: Ambulatory Visit | Attending: Adult Health | Admitting: Adult Health

## 2020-11-04 ENCOUNTER — Ambulatory Visit (INDEPENDENT_AMBULATORY_CARE_PROVIDER_SITE_OTHER): Payer: No Typology Code available for payment source | Admitting: Adult Health

## 2020-11-04 ENCOUNTER — Encounter: Payer: Self-pay | Admitting: Adult Health

## 2020-11-04 VITALS — BP 126/82 | HR 81 | Ht 64.0 in | Wt 114.0 lb

## 2020-11-04 DIAGNOSIS — Z01419 Encounter for gynecological examination (general) (routine) without abnormal findings: Secondary | ICD-10-CM | POA: Diagnosis not present

## 2020-11-04 NOTE — Progress Notes (Signed)
Patient ID: Madeline Davis, female   DOB: 1990/07/13, 30 y.o.   MRN: [id number] History of Present Illness: Madeline Davis is a 30 year old black female,single, G1P0010, in for a well woman gyn exam and pap. PCP is Laury Axon NP.   Current Medications, Allergies, Past Medical History, Past Surgical History, Family History and Social History were reviewed in [employer] record.     Review of Systems:  Patient denies any headaches, hearing loss, fatigue, blurred vision, shortness of breath, chest pain, abdominal pain, problems with bowel movements, urination, or intercourse(has female partner). No joint pain or mood swings. Last period was closer,like 2 weeks apart, has more cramps in last year or so   Physical Exam:BP 126/82 (BP Location: Right Arm, Patient Position: Sitting, Cuff Size: Normal)   Pulse 81   Ht 5\' 4"  (1.626 m)   Wt 114 lb (51.7 kg)   LMP 10/25/2020   BMI 19.57 kg/m  General:  Well developed, well nourished, no acute distress Skin:  Warm and dry Neck:  Midline trachea, normal thyroid, good ROM, no lymphadenopathy Lungs; Clear to auscultation bilaterally Breast:  No dominant palpable mass, retraction, or nipple discharge,has bilateral nipple rods Cardiovascular: Regular rate and rhythm Abdomen:  Soft, non tender, no hepatosplenomegaly Pelvic:  External genitalia is normal in appearance, no lesions.  The vagina is normal in appearance. Urethra has no lesions or masses. The cervix is smooth, pap with GC/CHL and HR HPV genotyping performed.  Uterus is felt to be normal size, shape, and contour.  No adnexal masses or tenderness noted.Bladder is non tender, no masses felt. Extremities/musculoskeletal:  No swelling or varicosities noted, no clubbing or cyanosis Psych:  No mood changes, alert and cooperative,seems happy AA is 3 Fall risk is low Depression screen [facility] 2/9 11/04/2020 06/30/2020 09/21/2017  Decreased Interest 0 0 1  Down, Depressed, Hopeless 0 0 1  PHQ - 2  Score 0 0 2  Altered sleeping 0 - 0  Tired, decreased energy 1 - 1  Change in appetite 0 - 0  Feeling bad or failure about yourself  0 - 0  Trouble concentrating 0 - 0  Moving slowly or fidgety/restless 0 - 0  Suicidal thoughts 0 - 0  PHQ-9 Score 1 - 3  Difficult doing work/chores - - Not difficult at all   GAD 7 : Generalized Anxiety Score 11/04/2020  Nervous, Anxious, on Edge 0  Control/stop worrying 0  Worry too much - different things 0  Trouble relaxing 0  Restless 0  Easily annoyed or irritable 1  Afraid - awful might happen 0  Total GAD 7 Score 1    Upstream - 11/04/20 1539      Pregnancy Intention Screening   Does the patient want to become pregnant in the next year? No    Does the patient's partner want to become pregnant in the next year? No    Would the patient like to discuss contraceptive options today? No      Contraception Wrap Up   Current Method No Contraceptive Precautions   has female partner   End Method No Contraception Precautions    Contraception Counseling Provided No          Examination chaperoned by 01/04/21 LPN  Impression and Plan: 1. Encounter for gynecological examination with Papanicolaou smear of cervix Pap sent Physical in 1 year Pap in 3 if normal Labs with PCP Mammogram at 39  Try advil with tylenol for any cramps She declines  birth control for periods

## 2020-11-12 LAB — CYTOLOGY - PAP
Adequacy: ABSENT
Chlamydia: NEGATIVE
Comment: NEGATIVE
Comment: NEGATIVE
Comment: NEGATIVE
Comment: NORMAL
Diagnosis: NEGATIVE
HPV 16: NEGATIVE
HPV 18 / 45: NEGATIVE
High risk HPV: POSITIVE — AB
Neisseria Gonorrhea: NEGATIVE

## 2020-11-13 ENCOUNTER — Telehealth: Payer: Self-pay | Admitting: Adult Health

## 2020-11-13 ENCOUNTER — Encounter: Payer: Self-pay | Admitting: Adult Health

## 2020-11-13 DIAGNOSIS — R87810 Cervical high risk human papillomavirus (HPV) DNA test positive: Secondary | ICD-10-CM

## 2020-11-13 HISTORY — DX: Cervical high risk human papillomavirus (HPV) DNA test positive: R87.810

## 2020-11-13 NOTE — Telephone Encounter (Signed)
Pt saw her PAP results in MyChart Would like to know what kind of follow up is needed, please advise & notify pt  She also asked if her partner should be tested?  States she would be a new pt to [place] Please advise

## 2020-11-13 NOTE — Telephone Encounter (Signed)
Pt aware of pap, and +HPV, repeat in 1 year and her partner is female, and she will be getting checked, clean sex toys well.

## 2021-01-19 ENCOUNTER — Ambulatory Visit
Admission: EM | Admit: 2021-01-19 | Discharge: 2021-01-19 | Disposition: A | Discharge status: Home / Self Care | Payer: No Typology Code available for payment source | Attending: Family Medicine | Admitting: Family Medicine

## 2021-01-19 DIAGNOSIS — B349 Viral infection, unspecified: Secondary | ICD-10-CM

## 2021-01-19 DIAGNOSIS — Z20822 Contact with and (suspected) exposure to covid-19: Secondary | ICD-10-CM

## 2021-01-19 DIAGNOSIS — J45901 Unspecified asthma with (acute) exacerbation: Secondary | ICD-10-CM

## 2021-01-19 MED ORDER — ALBUTEROL SULFATE HFA 108 (90 BASE) MCG/ACT IN AERS
2.0000 | INHALATION_SPRAY | Freq: Four times a day (QID) | RESPIRATORY_TRACT | Status: DC | PRN
  Administered 2021-01-19: 2 via RESPIRATORY_TRACT

## 2021-01-19 MED ORDER — PROMETHAZINE-DM 6.25-15 MG/5ML PO SYRP
5.0000 mL | ORAL_SOLUTION | Freq: Four times a day (QID) | ORAL | 0 refills | Status: DC | PRN
Start: 2021-01-19 — End: 2021-06-18

## 2021-01-19 MED ORDER — PREDNISONE 20 MG PO TABS: 40.0000 mg | ORAL_TABLET | Freq: Every day | ORAL | 0 refills | Status: DC

## 2021-01-19 NOTE — ED Provider Notes (Signed)
[facility]    CSN: [id number] Arrival date & time: 01/19/21  1154      History   Chief Complaint Chief Complaint  Patient presents with   Cough    HPI Madeline Davis is a 30 y.o. female.   HPI Patient presents today with acute onset body aches, chills, cough, fever, wheezing.She has taken ibuprofen without relief of body aches. She has used her albuterol inhaler and recently ran out. Continues to experience wheezing despite use of albuterol inhaler.  She is afebrile.  Symptoms began approximately 24 hours ago.  No known sick contacts.  Patient is a smoker. Past Medical History:  Diagnosis Date   Anemia    Phreesia 06/16/2020   Asthma    Asthma    Phreesia 06/16/2020   Constipation 08/12/2014   COVID    followed by cardiology now Bufford Buttner in [place])   Papanicolaou smear of cervix with positive high risk human papilloma virus (HPV) test 11/13/2020   11/13/20 repeat pap in 1 year, per [facility], 5 year risk for CIN 3+ is 4.8%   Tobacco use    Vitamin D deficiency 08/30/2016    Patient Active Problem List   Diagnosis Date Noted   Papanicolaou smear of cervix with positive high risk human papilloma virus (HPV) test 11/13/2020   Encounter to establish care 06/30/2020   Screening due 06/30/2020   ACS (acute coronary syndrome) (HCC) 09/15/2019   Viral syndrome 09/14/2019   Elevated troponin 09/14/2019   Hypokalemia 09/14/2019   COVID-19 virus infection 09/14/2019   Asthma 09/14/2019   Tobacco use 09/14/2019   Vitamin D deficiency 08/30/2016   Body aches 08/25/2016   Fatigue 08/25/2016   Constipation 08/12/2014    Past Surgical History:  Procedure Laterality Date   LEFT HEART CATH AND CORONARY ANGIOGRAPHY N/A 09/17/2019   Procedure: LEFT HEART CATH AND CORONARY ANGIOGRAPHY;  Surgeon: [name], MD;  Location: [facility];  Service: Cardiovascular;  Laterality: N/A;    OB History     Gravida  1   Para      Term      Preterm      AB  1   Living   0      SAB  1   IAB      Ectopic      Multiple      Live Births               Home Medications    Prior to Admission medications   Medication Sig Start Date End Date Taking? Authorizing Provider  predniSONE (DELTASONE) 20 MG tablet Take 2 tablets (40 mg total) by mouth daily with breakfast. 01/19/21  Yes Bing Neighbors, FNP  promethazine-dextromethorphan (PROMETHAZINE-DM) 6.25-15 MG/5ML syrup Take 5 mLs by mouth 4 (four) times daily as needed for cough. 01/19/21  Yes Bing Neighbors, FNP  albuterol (VENTOLIN HFA) 108 (90 Base) MCG/ACT inhaler Inhale 1-2 puffs into the lungs every 6 (six) hours as needed for wheezing or shortness of breath.    [provider]    Family History Family History  Problem Relation Age of Onset   Hypertension Mother    Heart disease Mother    Hypertension Father    Hyperlipidemia Father    Stroke Father    Heart attack Father 63   Alzheimer's disease Maternal Grandmother    Alzheimer's disease Maternal Grandfather     Social History Social History   Tobacco Use   Smoking status:  Some Days    Packs/day: 0.25    Years: 13.00    Pack years: 3.25    Types: Cigarettes   Smokeless tobacco: Never  Vaping Use   Vaping Use: Never used  Substance Use Topics   Alcohol use: Yes    Comment: occasionally-2-3 glasses of wine on weekends   Drug use: Never    Comment: not since september of 2012     Allergies   Patient has no known allergies.   Review of Systems Review of Systems Pertinent negatives listed in HPI  Physical Exam Triage Vital Signs ED Triage Vitals [01/19/21 1253]  Enc Vitals Group     BP 124/80     Pulse Rate (!) 113     Resp 18     Temp 99.5 F (37.5 C)     Temp src      SpO2 97 %     Weight      Height      Head Circumference      Peak Flow      Pain Score 8     Pain Loc      Pain Edu?      Excl. in GC?    No data found.  Updated Vital Signs BP 124/80   Pulse (!) 113   Temp 99.5  F (37.5 C)   Resp 18   LMP 01/13/2021   SpO2 97%   Visual Acuity Right Eye Distance:   Left Eye Distance:   Bilateral Distance:    Right Eye Near:   Left Eye Near:    Bilateral Near:     Physical Exam  General Appearance:    Alert, cooperative, no distress  HENT:   Normocephalic, ears normal, nares mucosal edema with congestion, rhinorrhea, oropharynx patent  Eyes:    PERRL, conjunctiva/corneas clear, EOM's intact       Lungs:   Diffuse wheezing bilateral lung fields, respirations unlabored  Heart:    Regular rate and rhythm  Neurologic:   Awake, alert, oriented x 3. No apparent focal neurological           defect.      UC Treatments / Results  Labs (all labs ordered are listed, but only abnormal results are displayed) Labs Reviewed  NOVEL CORONAVIRUS, NAA   Narrative:    Performed at:  [address] [place], [place]  [id number] Lab Director: Jolene Schimke MD, Phone:  [phone]  SARS-COV-2, NAA 2 DAY TAT   Narrative:    Performed at:  [address] [place] [address], [place], [place]  [id number] Lab Director: Jolene Schimke MD, Phone:  [phone]    EKG   Radiology No results found.  Procedures Procedures (including critical care time)  Medications Ordered in UC Medications - No data to display  Initial Impression / Assessment and Plan / UC Course  I have reviewed the triage vital signs and the nursing notes.  Pertinent labs & imaging results that were available during my care of the patient were reviewed by me and considered in my medical decision making (see chart for details).    COVID flu pending.  Treating for acute asthma exacerbation and viral illness. Treatment per discharge instructions.  Albuterol inhaler dispensed here in clinic 2 puffs administered here in clinic patient reports improved work of breathing.  ER precautions given if symptoms worsen or do not improve.  RTC as needed  Final Clinical  Impressions(s) / UC Diagnoses   Final  diagnoses:  Exposure to COVID-19 virus  Exacerbation of asthma, unspecified asthma severity, unspecified whether persistent  Viral illness     Discharge Instructions      COVID pending. Symptom management warranted only.  Manage fever with Tylenol and ibuprofen.  Nasal symptoms with over-the-counter antihistamines recommended.  Treatment per discharge medications/discharge instructions.  Red flags/ER precautions given. The most current CDC isolation/quarantine recommendation advised.       ED Prescriptions     Medication Sig Dispense Auth. Provider   predniSONE (DELTASONE) 20 MG tablet Take 2 tablets (40 mg total) by mouth daily with breakfast. 10 tablet Bing Neighbors, FNP   promethazine-dextromethorphan (PROMETHAZINE-DM) 6.25-15 MG/5ML syrup Take 5 mLs by mouth 4 (four) times daily as needed for cough. 140 mL Bing Neighbors, FNP      PDMP not reviewed this encounter.   Bing Neighbors, FNP 01/24/21 [id number]

## 2021-01-19 NOTE — Discharge Instructions (Addendum)
COVID pending. Symptom management warranted only.  Manage fever with Tylenol and ibuprofen.  Nasal symptoms with over-the-counter antihistamines recommended.  Treatment per discharge medications/discharge instructions.  Red flags/ER precautions given. The most current CDC isolation/quarantine recommendation advised.

## 2021-01-19 NOTE — ED Triage Notes (Signed)
Pt presents with c/o cough and chest congestion that began last night , has CP with cough

## 2021-01-20 LAB — SARS-COV-2, NAA 2 DAY TAT

## 2021-01-20 LAB — NOVEL CORONAVIRUS, NAA: SARS-CoV-2, NAA: NOT DETECTED

## 2021-06-18 ENCOUNTER — Telehealth: Payer: No Typology Code available for payment source | Admitting: Physician Assistant

## 2021-06-18 DIAGNOSIS — J019 Acute sinusitis, unspecified: Secondary | ICD-10-CM

## 2021-06-18 DIAGNOSIS — B9789 Other viral agents as the cause of diseases classified elsewhere: Secondary | ICD-10-CM

## 2021-06-18 MED ORDER — FLUTICASONE PROPIONATE 50 MCG/ACT NA SUSP
2.0000 | Freq: Every day | NASAL | 0 refills | Status: DC
Start: 2021-06-18 — End: 2022-10-07

## 2021-06-18 MED ORDER — PREDNISONE 20 MG PO TABS: 40.0000 mg | ORAL_TABLET | Freq: Every day | ORAL | 0 refills | Status: AC

## 2021-06-18 NOTE — Progress Notes (Signed)
E-Visit for Sinus Problems  We are sorry that you are not feeling well.  Here is how we plan to help!  Based on what you have shared with me it looks like you have sinusitis.  Sinusitis is inflammation and infection in the sinus cavities of the head.  Based on your presentation I believe you most likely have Acute Viral Sinusitis.This is an infection most likely caused by a virus. There is not specific treatment for viral sinusitis other than to help you with the symptoms until the infection runs its course.  You may use an oral decongestant such as Mucinex D or if you have glaucoma or high blood pressure use plain Mucinex. Saline nasal spray help and can safely be used as often as needed for congestion, I have prescribed: Fluticasone nasal spray two sprays in each nostril once a day  Giving this has set off your asthma, I am also sending in a prescription for prednisone 40 mg to take daily x 5 days.   Some authorities believe that zinc sprays or the use of Echinacea may shorten the course of your symptoms.  Sinus infections are not as easily transmitted as other respiratory infection, however we still recommend that you avoid close contact with loved ones, especially the very young and elderly.  Remember to wash your hands thoroughly throughout the day as this is the number one way to prevent the spread of infection!  Home Care: Only take medications as instructed by your medical team. Do not take these medications with alcohol. A steam or ultrasonic humidifier can help congestion.  You can place a towel over your head and breathe in the steam from hot water coming from a faucet. Avoid close contacts especially the very young and the elderly. Cover your mouth when you cough or sneeze. Always remember to wash your hands.  Get Help Right Away If: You develop worsening fever or sinus pain. You develop a severe head ache or visual changes. Your symptoms persist after you have completed your  treatment plan.  Make sure you Understand these instructions. Will watch your condition. Will get help right away if you are not doing well or get worse.   Thank you for choosing an e-visit.  Your e-visit answers were reviewed by a board certified advanced clinical practitioner to complete your personal care plan. Depending upon the condition, your plan could have included both over the counter or prescription medications.  Please review your pharmacy choice. Make sure the pharmacy is open so you can pick up prescription now. If there is a problem, you may contact your provider through [employer] and have the prescription routed to another pharmacy.  Your safety is important to [place]. If you have drug allergies check your prescription carefully.   For the next 24 hours you can use MyChart to ask questions about today's visit, request a non-urgent call back, or ask for a work or school excuse. You will get an email in the next two days asking about your experience. I hope that your e-visit has been valuable and will speed your recovery.

## 2021-06-18 NOTE — Progress Notes (Signed)
I have spent 5 minutes in review of e-visit questionnaire, review and updating patient chart, medical decision making and response to patient.   Anush Wiedeman Cody Aden Sek, PA-C    

## 2021-11-16 ENCOUNTER — Other Ambulatory Visit (HOSPITAL_COMMUNITY)
Admission: RE | Admit: 2021-11-16 | Discharge: 2021-11-16 | Disposition: A | Discharge status: Home / Self Care | Payer: No Typology Code available for payment source | Source: Ambulatory Visit | Attending: Adult Health | Admitting: Adult Health

## 2021-11-16 ENCOUNTER — Encounter: Payer: Self-pay | Admitting: Adult Health

## 2021-11-16 ENCOUNTER — Ambulatory Visit (INDEPENDENT_AMBULATORY_CARE_PROVIDER_SITE_OTHER): Payer: No Typology Code available for payment source | Admitting: Adult Health

## 2021-11-16 VITALS — BP 130/85 | HR 84 | Ht 60.0 in | Wt 119.0 lb

## 2021-11-16 DIAGNOSIS — Z01419 Encounter for gynecological examination (general) (routine) without abnormal findings: Secondary | ICD-10-CM | POA: Insufficient documentation

## 2021-11-16 DIAGNOSIS — N926 Irregular menstruation, unspecified: Secondary | ICD-10-CM | POA: Diagnosis not present

## 2021-11-16 NOTE — Progress Notes (Signed)
Patient ID: Madeline Davis, female   DOB: [birth year], 31 y.o.   MRN: [id number] History of Present Illness:  Madeline Davis is a 31 year old black female,single, G1P0010, in for a well woman gyn exam and pap. Had period about 2 weeks apart, first time this has happened, has had relationship break up recently. PCP is Dr Allena Katz   Current Medications, Allergies, Past Medical History, Past Surgical History, Family History and Social History were reviewed in [employer] electronic medical record.     Review of Systems:  Patient denies any headaches, hearing loss, fatigue, blurred vision, shortness of breath, chest pain, abdominal pain, problems with bowel movements, urination, or intercourse.(Not currently active, is lesbian) No joint pain or mood swings.  See HPI for positives.  Physical Exam:BP 130/85 (BP Location: Right Arm, Patient Position: Sitting, Cuff Size: Normal)   Pulse 84   Ht 5' (1.524 m)   Wt 119 lb (54 kg)   LMP 11/09/2021   BMI 23.24 kg/m   General:  Well developed, well nourished, no acute distress Skin:  Warm and dry Neck:  Midline trachea, normal thyroid, good ROM, no lymphadenopathy Lungs; Clear to auscultation bilaterally Breast:  No dominant palpable mass, retraction, or nipple discharge,has bilateral nipple rods  Cardiovascular: Regular rate and rhythm Abdomen:  Soft, non tender, no hepatosplenomegaly Pelvic:  External genitalia is normal in appearance, no lesions.  The vagina is normal in appearance. Has white discharge. Urethra has no lesions or masses. The cervix is smooth, and nulliparous, pap with GC/CHL, and HR HPV genotyping performed. Uterus is felt to be normal size, shape, and contour.  No adnexal masses or tenderness noted.Bladder is non tender, no masses felt. Extremities/musculoskeletal:  No swelling or varicosities noted, no clubbing or cyanosis Psych:  No mood changes, alert and cooperative,seems happy AA is 3 Fall risk is low    11/16/2021    2:43 PM 11/04/2020     3:39 PM 06/30/2020    4:12 PM  Depression screen PHQ 2/9  Decreased Interest 1 0 0  Down, Depressed, Hopeless 1 0 0  PHQ - 2 Score 2 0 0  Altered sleeping 1 0   Tired, decreased energy 1 1   Change in appetite 1 0   Feeling bad or failure about yourself  0 0   Trouble concentrating 0 0   Moving slowly or fidgety/restless 0 0   Suicidal thoughts 0 0   PHQ-9 Score 5 1        11/16/2021    2:43 PM 11/04/2020    3:39 PM  GAD 7 : Generalized Anxiety Score  Nervous, Anxious, on Edge 1 0  Control/stop worrying 1 0  Worry too much - different things 1 0  Trouble relaxing 1 0  Restless 0 0  Easily annoyed or irritable 1 1  Afraid - awful might happen 0 0  Total GAD 7 Score 5 1    Upstream - 11/16/21 1443       Pregnancy Intention Screening   Does the patient want to become pregnant in the next year? No    Does the patient's partner want to become pregnant in the next year? No    Would the patient like to discuss contraceptive options today? No      Contraception Wrap Up   Current Method No Contraceptive Precautions    End Method No Contraception Precautions    Contraception Counseling Provided No  Examination chaperoned by Faith Rogue LPN  Impression and Plan: 1. Encounter for gynecological examination with Papanicolaou smear of cervix Pap sent Physical in 1 year Pap in 3 years if normal Get labs with PCP  2. Irregular menstrual bleeding Had period about 2 week apart Will watch for now since this first episode She is aware can use OCs if needed

## 2021-11-19 LAB — CYTOLOGY - PAP
Chlamydia: NEGATIVE
Comment: NEGATIVE
Comment: NEGATIVE
Comment: NORMAL
Diagnosis: NEGATIVE
High risk HPV: NEGATIVE
Neisseria Gonorrhea: NEGATIVE

## 2022-02-22 ENCOUNTER — Ambulatory Visit
Admission: RE | Admit: 2022-02-22 | Discharge: 2022-02-22 | Disposition: A | Discharge status: Home / Self Care | Payer: Managed Care, Other (non HMO) | Source: Ambulatory Visit | Attending: Nurse Practitioner | Admitting: Nurse Practitioner

## 2022-02-22 VITALS — BP 135/82 | HR 75 | Temp 99.2°F | Resp 18

## 2022-02-22 DIAGNOSIS — Z1152 Encounter for screening for COVID-19: Secondary | ICD-10-CM | POA: Insufficient documentation

## 2022-02-22 DIAGNOSIS — Z8709 Personal history of other diseases of the respiratory system: Secondary | ICD-10-CM | POA: Insufficient documentation

## 2022-02-22 DIAGNOSIS — J069 Acute upper respiratory infection, unspecified: Secondary | ICD-10-CM | POA: Diagnosis present

## 2022-02-22 LAB — RESP PANEL BY RT-PCR (FLU A&B, COVID) ARPGX2
Influenza A by PCR: NEGATIVE
Influenza B by PCR: NEGATIVE
SARS Coronavirus 2 by RT PCR: NEGATIVE

## 2022-02-22 MED ORDER — BENZONATATE 100 MG PO CAPS: 100.0000 mg | ORAL_CAPSULE | Freq: Three times a day (TID) | ORAL | 0 refills | Status: DC | PRN

## 2022-02-22 MED ORDER — ALBUTEROL SULFATE (2.5 MG/3ML) 0.083% IN NEBU: 2.5000 mg | INHALATION_SOLUTION | Freq: Four times a day (QID) | RESPIRATORY_TRACT | 0 refills | Status: DC | PRN

## 2022-02-22 NOTE — ED Triage Notes (Signed)
Pt presents with nasal congestion and cough that began this morning

## 2022-02-22 NOTE — Discharge Instructions (Addendum)
Your symptoms and exam findings are most consistent with a viral upper respiratory infection. These usually run their course in about 10 days.  If your symptoms last longer than 10 days without improvement, please follow up with your primary care provider.  If your symptoms, worsen, please go to the Emergency Room.    We have tested you today for COVID-19 and influenza.  You will see the results in Mychart and we will call you with positive results.    Please stay home and isolate until you are aware of the results.    Some things that can make you feel better are: - Increased rest - Increasing fluid with water/sugar free electrolytes - Acetaminophen and ibuprofen as needed for fever/pain.  - Salt water gargling, chloraseptic spray and throat lozenges - OTC guaifenesin (Mucinex).  - Saline sinus flushes or a neti pot.  - Humidifying the air. - Tessalon perles every 8 hours as needed for cough  Use the albuterol rescue medicine every 4-6 hours as needed for wheezing or shortness of breath.

## 2022-02-22 NOTE — ED Provider Notes (Signed)
[facility]    CSN: [id number] Arrival date & time: 02/22/22  1211      History   Chief Complaint Chief Complaint  Patient presents with   Cough    Fatigue, slight shortness of breath, low grade fever - Entered by patient    HPI Madeline Davis is a 31 y.o. female.   Patient presents for 1 day of low-grade fever, body aches, dry cough, shortness of breath with movement, wheezing that is improved with albuterol use, chest tightness, nasal congestion, postnasal drainage, sore throat, bilateral ear pain, sinus pressure, headache, and fatigue.  She denies chest pain, chest congestion, sneezing, abdominal pain, nausea/vomiting, diarrhea, decreased appetite, and new rash.  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  She has taken albuterol inhaler so far which does help relieve wheezing and shortness of breath.  Medical history significant for asthma and has had COVID-19 in the past.    Past Medical History:  Diagnosis Date   Anemia    Phreesia 06/16/2020   Asthma    Asthma    Phreesia 06/16/2020   Constipation 08/12/2014   COVID    followed by cardiology now Bufford Buttner in [place])   Papanicolaou smear of cervix with positive high risk human papilloma virus (HPV) test 11/13/2020   11/13/20 repeat pap in 1 year, per [facility], 5 year risk for CIN 3+ is 4.8%   Tobacco use    Vitamin D deficiency 08/30/2016    Patient Active Problem List   Diagnosis Date Noted   Irregular menstrual bleeding 11/16/2021   Encounter for gynecological examination with Papanicolaou smear of cervix 11/16/2021   Papanicolaou smear of cervix with positive high risk human papilloma virus (HPV) test 11/13/2020   Encounter to establish care 06/30/2020   Screening due 06/30/2020   ACS (acute coronary syndrome) (HCC) 09/15/2019   Viral syndrome 09/14/2019   Elevated troponin 09/14/2019   Hypokalemia 09/14/2019   COVID-19 virus infection 09/14/2019   Asthma 09/14/2019    Tobacco use 09/14/2019   Vitamin D deficiency 08/30/2016   Body aches 08/25/2016   Fatigue 08/25/2016   Constipation 08/12/2014    Past Surgical History:  Procedure Laterality Date   LEFT HEART CATH AND CORONARY ANGIOGRAPHY N/A 09/17/2019   Procedure: LEFT HEART CATH AND CORONARY ANGIOGRAPHY;  Surgeon: [name], MD;  Location: [facility];  Service: Cardiovascular;  Laterality: N/A;    OB History     Gravida  1   Para      Term      Preterm      AB  1   Living  0      SAB  1   IAB      Ectopic      Multiple      Live Births               Home Medications    Prior to Admission medications   Medication Sig Start Date End Date Taking? Authorizing Provider  albuterol (PROVENTIL) (2.5 MG/3ML) 0.083% nebulizer solution Take 3 mLs (2.5 mg total) by nebulization every 6 (six) hours as needed for wheezing or shortness of breath. 02/22/22  Yes Valentino Nose, NP  benzonatate (TESSALON) 100 MG capsule Take 1 capsule (100 mg total) by mouth 3 (three) times daily as needed for cough. Do not take with alcohol or while driving or operating heavy machinery 02/22/22  Yes Valentino Nose, NP  albuterol (VENTOLIN HFA) 108 (  90 Base) MCG/ACT inhaler Inhale 1-2 puffs into the lungs every 6 (six) hours as needed for wheezing or shortness of breath.    [provider]  fluticasone (FLONASE) 50 MCG/ACT nasal spray Place 2 sprays into both nostrils daily. 06/18/21   Brunetta Jeans, PA-C    Family History Family History  Problem Relation Age of Onset   Hypertension Mother    Heart disease Mother    Hypertension Father    Hyperlipidemia Father    Stroke Father    Heart attack Father 46   Alzheimer's disease Maternal Grandmother    Alzheimer's disease Maternal Grandfather     Social History Social History   Tobacco Use   Smoking status: Some Days    Packs/day: 0.25    Years: 13.00    Total pack years: 3.25    Types: Cigarettes    Smokeless tobacco: Never  Vaping Use   Vaping Use: Never used  Substance Use Topics   Alcohol use: Yes    Comment: occasionally-2-3 glasses of wine on weekends   Drug use: Never    Comment: not since september of 2012     Allergies   Patient has no known allergies.   Review of Systems Review of Systems Per HPI  Physical Exam Triage Vital Signs ED Triage Vitals  Enc Vitals Group     BP 02/22/22 1227 135/82     Pulse Rate 02/22/22 1227 75     Resp 02/22/22 1227 18     Temp 02/22/22 1227 99.2 F (37.3 C)     Temp src --      SpO2 02/22/22 1227 96 %     Weight --      Height --      Head Circumference --      Peak Flow --      Pain Score 02/22/22 1225 4     Pain Loc --      Pain Edu? --      Excl. in [place]? --    No data found.  Updated Vital Signs BP 135/82   Pulse 75   Temp 99.2 F (37.3 C)   Resp 18   LMP 02/01/2022   SpO2 96%   Visual Acuity Right Eye Distance:   Left Eye Distance:   Bilateral Distance:    Right Eye Near:   Left Eye Near:    Bilateral Near:     Physical Exam Vitals and nursing note reviewed.  Constitutional:      General: She is not in acute distress.    Appearance: Normal appearance. She is not ill-appearing or toxic-appearing.  HENT:     Head: Normocephalic and atraumatic.     Right Ear: Tympanic membrane, ear canal and external ear normal.     Left Ear: Tympanic membrane, ear canal and external ear normal.     Nose: Congestion and rhinorrhea present.     Mouth/Throat:     Mouth: Mucous membranes are moist.     Pharynx: Oropharynx is clear. Posterior oropharyngeal erythema present. No oropharyngeal exudate.  Eyes:     General: No scleral icterus.    Extraocular Movements: Extraocular movements intact.  Cardiovascular:     Rate and Rhythm: Normal rate and regular rhythm.  Pulmonary:     Effort: Pulmonary effort is normal. No respiratory distress.     Breath sounds: Normal breath sounds. No wheezing, rhonchi or rales.   Abdominal:     General: Abdomen is flat. Bowel sounds are  normal. There is no distension.     Palpations: Abdomen is soft.     Tenderness: There is no abdominal tenderness.  Musculoskeletal:     Cervical back: Normal range of motion and neck supple.  Skin:    General: Skin is warm and dry.     Coloration: Skin is not jaundiced or pale.     Findings: No erythema or rash.  Neurological:     Mental Status: She is alert and oriented to person, place, and time.  Psychiatric:        Behavior: Behavior is cooperative.      UC Treatments / Results  Labs (all labs ordered are listed, but only abnormal results are displayed) Labs Reviewed  RESP PANEL BY RT-PCR (FLU A&B, COVID) ARPGX2    EKG   Radiology No results found.  Procedures Procedures (including critical care time)  Medications Ordered in UC Medications - No data to display  Initial Impression / Assessment and Plan / UC Course  I have reviewed the triage vital signs and the nursing notes.  Pertinent labs & imaging results that were available during my care of the patient were reviewed by me and considered in my medical decision making (see chart for details).    Patient is well-appearing, normotensive, afebrile, not tachycardic, not tachypneic, oxygenating well on room air.  COVID-19 and influenza testing obtained.  Patient is a candidate for Tamiflu or molnupiravir if she test positive for influenza or COVID-19, respectively.  Supportive care discussed.  ER precautions and return precautions discussed.  Note given for work.  Refill sent for albuterol nebulizer to use every 4-6 hours as needed for wheezing or shortness of breath.  Also start cough suppressant as needed.  The patient was given the opportunity to ask questions.  All questions answered to their satisfaction.  The patient is in agreement to this plan.    Final Clinical Impressions(s) / UC Diagnoses   Final diagnoses:  Encounter for screening for COVID-19   Viral URI with cough     Discharge Instructions      Your symptoms and exam findings are most consistent with a viral upper respiratory infection. These usually run their course in about 10 days.  If your symptoms last longer than 10 days without improvement, please follow up with your primary care provider.  If your symptoms, worsen, please go to the Emergency Room.    We have tested you today for COVID-19 and influenza.  You will see the results in Mychart and we will call you with positive results.    Please stay home and isolate until you are aware of the results.    Some things that can make you feel better are: - Increased rest - Increasing fluid with water/sugar free electrolytes - Acetaminophen and ibuprofen as needed for fever/pain.  - Salt water gargling, chloraseptic spray and throat lozenges - OTC guaifenesin (Mucinex).  - Saline sinus flushes or a neti pot.  - Humidifying the air. - Tessalon perles every 8 hours as needed for cough  Use the albuterol rescue medicine every 4-6 hours as needed for wheezing or shortness of breath.     ED Prescriptions     Medication Sig Dispense Auth. Provider   albuterol (PROVENTIL) (2.5 MG/3ML) 0.083% nebulizer solution Take 3 mLs (2.5 mg total) by nebulization every 6 (six) hours as needed for wheezing or shortness of breath. 75 mL Cathlean Marseilles A, NP   benzonatate (TESSALON) 100 MG capsule Take 1 capsule (100  mg total) by mouth 3 (three) times daily as needed for cough. Do not take with alcohol or while driving or operating heavy machinery 21 capsule Valentino Nose, NP      PDMP not reviewed this encounter.   Valentino Nose, NP 02/22/22 1357

## 2022-03-09 ENCOUNTER — Telehealth: Payer: Managed Care, Other (non HMO) | Admitting: Nurse Practitioner

## 2022-03-09 DIAGNOSIS — N760 Acute vaginitis: Secondary | ICD-10-CM | POA: Diagnosis not present

## 2022-03-09 DIAGNOSIS — B9689 Other specified bacterial agents as the cause of diseases classified elsewhere: Secondary | ICD-10-CM

## 2022-03-09 MED ORDER — METRONIDAZOLE 500 MG PO TABS
500.0000 mg | ORAL_TABLET | Freq: Two times a day (BID) | ORAL | 0 refills | Status: AC
Start: 2022-03-09 — End: 2022-03-16

## 2022-03-09 NOTE — Progress Notes (Signed)
E-Visit for Vaginal Symptoms  We are sorry that you are not feeling well. Here is how we plan to help! Based on what you shared with me it looks like you: May have a vaginosis due to bacteria  Vaginosis is an inflammation of the vagina that can result in discharge, itching and pain. The cause is usually a change in the normal balance of vaginal bacteria or an infection. Vaginosis can also result from reduced estrogen levels after menopause.  The most common causes of vaginosis are:   Bacterial vaginosis which results from an overgrowth of one on several organisms that are normally present in your vagina.   Yeast infections which are caused by a naturally occurring fungus called candida.   Vaginal atrophy (atrophic vaginosis) which results from the thinning of the vagina from reduced estrogen levels after menopause.   Trichomoniasis which is caused by a parasite and is commonly transmitted by sexual intercourse.  Factors that increase your risk of developing vaginosis include: Medications, such as antibiotics and steroids Uncontrolled diabetes Use of hygiene products such as bubble bath, vaginal spray or vaginal deodorant Douching Wearing damp or tight-fitting clothing Using an intrauterine device (IUD) for birth control Hormonal changes, such as those associated with pregnancy, birth control pills or menopause Sexual activity Having a sexually transmitted infection  Your treatment plan is Metronidazole or Flagyl 500mg twice a day for 7 days.  I have electronically sent this prescription into the pharmacy that you have chosen.  Be sure to take all of the medication as directed. Stop taking any medication if you develop a rash, tongue swelling or shortness of breath. Mothers who are breast feeding should consider pumping and discarding their breast milk while on these antibiotics. However, there is no consensus that infant exposure at these doses would be harmful.  Remember that  medication creams can weaken latex condoms. .   HOME CARE:  Good hygiene may prevent some types of vaginosis from recurring and may relieve some symptoms:  Avoid baths, hot tubs and whirlpool spas. Rinse soap from your outer genital area after a shower, and dry the area well to prevent irritation. Don't use scented or harsh soaps, such as those with deodorant or antibacterial action. Avoid irritants. These include scented tampons and pads. Wipe from front to back after using the toilet. Doing so avoids spreading fecal bacteria to your vagina.  Other things that may help prevent vaginosis include:  Don't douche. Your vagina doesn't require cleansing other than normal bathing. Repetitive douching disrupts the normal organisms that reside in the vagina and can actually increase your risk of vaginal infection. Douching won't clear up a vaginal infection. Use a latex condom. Both female and female latex condoms may help you avoid infections spread by sexual contact. Wear cotton underwear. Also wear pantyhose with a cotton crotch. If you feel comfortable without it, skip wearing underwear to bed. Yeast thrives in moist environments Your symptoms should improve in the next day or two.  GET HELP RIGHT AWAY IF:  You have pain in your lower abdomen ( pelvic area or over your ovaries) You develop nausea or vomiting You develop a fever Your discharge changes or worsens You have persistent pain with intercourse You develop shortness of breath, a rapid pulse, or you faint.  These symptoms could be signs of problems or infections that need to be evaluated by a medical provider now.  MAKE SURE YOU   Understand these instructions. Will watch your condition. Will get help right   away if you are not doing well or get worse.  Thank you for choosing an e-visit.  Your e-visit answers were reviewed by a board certified advanced clinical practitioner to complete your personal care plan. Depending upon the  condition, your plan could have included both over the counter or prescription medications.  Please review your pharmacy choice. Make sure the pharmacy is open so you can pick up prescription now. If there is a problem, you may contact your provider through MyChart messaging and have the prescription routed to another pharmacy.  Your safety is important to us. If you have drug allergies check your prescription carefully.   For the next 24 hours you can use MyChart to ask questions about today's visit, request a non-urgent call back, or ask for a work or school excuse. You will get an email in the next two days asking about your experience. I hope that your e-visit has been valuable and will speed your recovery.   Meds ordered this encounter  Medications   metroNIDAZOLE (FLAGYL) 500 MG tablet    Sig: Take 1 tablet (500 mg total) by mouth 2 (two) times daily for 7 days.    Dispense:  14 tablet    Refill:  0    I spent approximately 5 minutes reviewing the patient's history, current symptoms and coordinating their plan of care today.   

## 2022-03-11 ENCOUNTER — Telehealth: Payer: Managed Care, Other (non HMO) | Admitting: Emergency Medicine

## 2022-03-11 ENCOUNTER — Ambulatory Visit
Admission: EM | Admit: 2022-03-11 | Discharge: 2022-03-11 | Disposition: A | Discharge status: Home / Self Care | Payer: Managed Care, Other (non HMO) | Attending: Nurse Practitioner | Admitting: Nurse Practitioner

## 2022-03-11 ENCOUNTER — Other Ambulatory Visit: Payer: Self-pay

## 2022-03-11 ENCOUNTER — Encounter: Payer: Self-pay | Admitting: Emergency Medicine

## 2022-03-11 DIAGNOSIS — Z113 Encounter for screening for infections with a predominantly sexual mode of transmission: Secondary | ICD-10-CM | POA: Diagnosis not present

## 2022-03-11 DIAGNOSIS — Z8616 Personal history of COVID-19: Secondary | ICD-10-CM | POA: Diagnosis not present

## 2022-03-11 DIAGNOSIS — R350 Frequency of micturition: Secondary | ICD-10-CM | POA: Diagnosis present

## 2022-03-11 DIAGNOSIS — R300 Dysuria: Secondary | ICD-10-CM

## 2022-03-11 LAB — POCT URINE PREGNANCY: Preg Test, Ur: NEGATIVE

## 2022-03-11 LAB — POCT URINALYSIS DIP (MANUAL ENTRY)
Bilirubin, UA: NEGATIVE
Glucose, UA: NEGATIVE mg/dL
Ketones, POC UA: NEGATIVE mg/dL
Leukocytes, UA: NEGATIVE
Nitrite, UA: NEGATIVE
Protein Ur, POC: NEGATIVE mg/dL
Spec Grav, UA: 1.01 (ref 1.010–1.025)
Urobilinogen, UA: 0.2 E.U./dL
pH, UA: 6.5 (ref 5.0–8.0)

## 2022-03-11 NOTE — Progress Notes (Signed)
Because you have vaginal discharge and now you also have urinary symptoms, I feel your condition warrants further evaluation and I recommend that you be seen in a face to face visit.   NOTE: There will be NO CHARGE for this eVisit   If you are having a true medical emergency please call 911.      For an urgent face to face visit, [facility] has seven urgent care centers for your convenience:     [facility] [place] [facility] [place] Get Driving Directions [phone] [address], [place] [address]    [facility]) Get Driving Directions [phone] [place], [place] [address]  [facility] [place] ([place]) Get Driving Directions [phone] [address] [place],  [place]  [address]  [facility] [place] [facility] - at [address] Get Driving Directions  [phone] [address] [place],  [place] [address]   [facility] Get Driving Directions [phone] [address], [place], [place] [address]   [facility] Get Driving Directions  [phone] [address] [place], [place] [address]   [facility] [place] Get Driving Directions [phone] [address], [place], [place] [address]  Your MyChart E-visit questionnaire answers were reviewed by a board certified advanced clinical practitioner to complete your personal care plan based on your specific symptoms.  Thank you for using e-Visits.

## 2022-03-11 NOTE — ED Provider Notes (Addendum)
[facility]    CSN: [id number] Arrival date & time: 03/11/22  1728      History   Chief Complaint Chief Complaint  Patient presents with   Urinary Frequency    Entered by patient    HPI Madeline Davis is a 31 y.o. female.   The history is provided by the patient.   Patient presents for complaints of urinary frequency that started approximately 2 days ago.  She would also like STI testing. patient states that she completed an E-visit and was prescribed Flagyl for bacterial vaginosis.  She states soon after she started the Flagyl, she developed urinary frequency.  She denies fever, chills, abdominal pain, dysuria, hematuria, flank pain, low back pain, abdominal pain, vaginal discharge, vaginal odor, or vaginal itching.  Patient reports ports 2 female partners in the past 90 days.  Denies any previous history of STIs.  Past Medical History:  Diagnosis Date   Anemia    Phreesia 06/16/2020   Asthma    Asthma    Phreesia 06/16/2020   Constipation 08/12/2014   COVID    followed by cardiology now Bufford Buttner in [place])   Papanicolaou smear of cervix with positive high risk human papilloma virus (HPV) test 11/13/2020   11/13/20 repeat pap in 1 year, per [facility], 5 year risk for CIN 3+ is 4.8%   Tobacco use    Vitamin D deficiency 08/30/2016    Patient Active Problem List   Diagnosis Date Noted   Irregular menstrual bleeding 11/16/2021   Encounter for gynecological examination with Papanicolaou smear of cervix 11/16/2021   Papanicolaou smear of cervix with positive high risk human papilloma virus (HPV) test 11/13/2020   Encounter to establish care 06/30/2020   Screening due 06/30/2020   ACS (acute coronary syndrome) (HCC) 09/15/2019   Viral syndrome 09/14/2019   Elevated troponin 09/14/2019   Hypokalemia 09/14/2019   COVID-19 virus infection 09/14/2019   Asthma 09/14/2019   Tobacco use 09/14/2019   Vitamin D deficiency 08/30/2016   Body aches 08/25/2016   Fatigue 08/25/2016    Constipation 08/12/2014    Past Surgical History:  Procedure Laterality Date   LEFT HEART CATH AND CORONARY ANGIOGRAPHY N/A 09/17/2019   Procedure: LEFT HEART CATH AND CORONARY ANGIOGRAPHY;  Surgeon: [name], MD;  Location: [facility];  Service: Cardiovascular;  Laterality: N/A;    OB History     Gravida  1   Para      Term      Preterm      AB  1   Living  0      SAB  1   IAB      Ectopic      Multiple      Live Births               Home Medications    Prior to Admission medications   Medication Sig Start Date End Date Taking? Authorizing Provider  albuterol (PROVENTIL) (2.5 MG/3ML) 0.083% nebulizer solution Take 3 mLs (2.5 mg total) by nebulization every 6 (six) hours as needed for wheezing or shortness of breath. 02/22/22   Valentino Nose, NP  albuterol (VENTOLIN HFA) 108 (90 Base) MCG/ACT inhaler Inhale 1-2 puffs into the lungs every 6 (six) hours as needed for wheezing or shortness of breath.    [provider]  benzonatate (TESSALON) 100 MG capsule Take 1 capsule (100 mg total) by mouth 3 (three) times daily as needed for cough. Do not take with  alcohol or while driving or operating heavy machinery 02/22/22   Valentino Nose, NP  fluticasone (FLONASE) 50 MCG/ACT nasal spray Place 2 sprays into both nostrils daily. 06/18/21   Waldon Merl, PA-C  metroNIDAZOLE (FLAGYL) 500 MG tablet Take 1 tablet (500 mg total) by mouth 2 (two) times daily for 7 days. 03/09/22 03/16/22  Viviano Simas, FNP    Family History Family History  Problem Relation Age of Onset   Hypertension Mother    Heart disease Mother    Hypertension Father    Hyperlipidemia Father    Stroke Father    Heart attack Father 34   Alzheimer's disease Maternal Grandmother    Alzheimer's disease Maternal Grandfather     Social History Social History   Tobacco Use   Smoking status: Some Days    Packs/day: 0.25    Years: 13.00    Total pack years:  3.25    Types: Cigarettes   Smokeless tobacco: Never  Vaping Use   Vaping Use: Never used  Substance Use Topics   Alcohol use: Yes    Comment: occasionally-2-3 glasses of wine on weekends   Drug use: Never    Comment: not since september of 2012     Allergies   Patient has no known allergies.   Review of Systems Review of Systems Per HPI  Physical Exam Triage Vital Signs ED Triage Vitals  Enc Vitals Group     BP 03/11/22 1836 (!) 145/99     Pulse Rate 03/11/22 1836 73     Resp 03/11/22 1836 20     Temp 03/11/22 1836 98.6 F (37 C)     Temp Source 03/11/22 1836 Oral     SpO2 03/11/22 1836 99 %     Weight --      Height --      Head Circumference --      Peak Flow --      Pain Score 03/11/22 1837 0     Pain Loc --      Pain Edu? --      Excl. in GC? --    No data found.  Updated Vital Signs BP (!) 145/99 (BP Location: Right Arm)   Pulse 73   Temp 98.6 F (37 C) (Oral)   Resp 20   LMP 02/21/2022 (Approximate)   SpO2 99%   Visual Acuity Right Eye Distance:   Left Eye Distance:   Bilateral Distance:    Right Eye Near:   Left Eye Near:    Bilateral Near:     Physical Exam Vitals and nursing note reviewed.  Constitutional:      General: She is not in acute distress.    Appearance: Normal appearance.  HENT:     Head: Normocephalic.  Cardiovascular:     Rate and Rhythm: Regular rhythm.     Pulses: Normal pulses.     Heart sounds: Normal heart sounds.  Pulmonary:     Effort: Pulmonary effort is normal.     Breath sounds: Normal breath sounds.  Abdominal:     General: Bowel sounds are normal.     Palpations: Abdomen is soft.     Tenderness: There is no right CVA tenderness or left CVA tenderness.  Musculoskeletal:     Cervical back: Normal range of motion.  Skin:    General: Skin is warm and dry.  Neurological:     General: No focal deficit present.     Mental Status: She is alert and  oriented to person, place, and time.  Psychiatric:         Mood and Affect: Mood normal.        Behavior: Behavior normal.      UC Treatments / Results  Labs (all labs ordered are listed, but only abnormal results are displayed) Labs Reviewed  POCT URINALYSIS DIP (MANUAL ENTRY) - Abnormal; Notable for the following components:      Result Value   Blood, UA trace-intact (*)    All other components within normal limits  URINE CULTURE  POCT URINE PREGNANCY  CERVICOVAGINAL ANCILLARY ONLY    EKG   Radiology No results found.  Procedures Procedures (including critical care time)  Medications Ordered in UC Medications - No data to display  Initial Impression / Assessment and Plan / UC Course  I have reviewed the triage vital signs and the nursing notes.  Pertinent labs & imaging results that were available during my care of the patient were reviewed by me and considered in my medical decision making (see chart for details).  Urinalysis does not indicate an obvious urinary tract infection.  Urine culture is pending at this time.  Physical exam findings and vital signs are stable for discharge.  Patient was advised that the pending urine culture and cytology results will be resulted, and she will be able to see the results in [place].  Patient's symptoms are consistent with urinary frequency.  Patient was advised that if the outstanding test results are positive, she will be contacted to discuss treatment.  Patient was advised to continue the Flagyl she is currently taking for bacterial vaginosis.  Patient was given supportive care recommendations along with strict indications of when to follow up.  Patient verbalizes understanding.  All questions were answered. Final Clinical Impressions(s) / UC Diagnoses   Final diagnoses:  Urinary frequency     Discharge Instructions      The urinalysis did not show any obvious urinary tract infection.  As discussed, a urine culture has been ordered for confirmatory testing.  The cytology results  should be available within the next 48 to 72 hours.  If the results are positive, you will be contacted to discuss treatment, and if the urine culture result is positive. Continue the Flagyl you are currently taking. Increase fluids.  Try to drink at least 8-10 8 ounce glasses of water daily. May take over-the-counter Tylenol or ibuprofen as needed for pain, fever, or general discomfort. Recommend toileting or voiding every 2 hours. Avoid caffeine to include tea, soda, and coffee while symptoms persist. If the urine culture results are negative and you are continuing to experience symptoms, please follow-up with your primary care physician for further evaluation.     ED Prescriptions   None    PDMP not reviewed this encounter.   Tish Men, NP 03/11/22 1914    Tish Men, NP 03/11/22 1914

## 2022-03-11 NOTE — Discharge Instructions (Signed)
The urinalysis did not show any obvious urinary tract infection.  As discussed, a urine culture has been ordered for confirmatory testing.  The cytology results should be available within the next 48 to 72 hours.  If the results are positive, you will be contacted to discuss treatment, and if the urine culture result is positive. Continue the Flagyl you are currently taking. Increase fluids.  Try to drink at least 8-10 8 ounce glasses of water daily. May take over-the-counter Tylenol or ibuprofen as needed for pain, fever, or general discomfort. Recommend toileting or voiding every 2 hours. Avoid caffeine to include tea, soda, and coffee while symptoms persist. If the urine culture results are negative and you are continuing to experience symptoms, please follow-up with your primary care physician for further evaluation.

## 2022-03-11 NOTE — ED Triage Notes (Addendum)
Pt reports urinary frequency. Pt reports would also like to be tested for STD. Denies any known exposures.   Reports was recently started on flagyl prescription on Tuesday.

## 2022-03-12 LAB — CERVICOVAGINAL ANCILLARY ONLY
Bacterial Vaginitis (gardnerella): POSITIVE — AB
Candida Glabrata: NEGATIVE
Candida Vaginitis: NEGATIVE
Chlamydia: NEGATIVE
Comment: NEGATIVE
Comment: NEGATIVE
Comment: NEGATIVE
Comment: NEGATIVE
Comment: NEGATIVE
Comment: NORMAL
Neisseria Gonorrhea: NEGATIVE
Trichomonas: NEGATIVE

## 2022-03-13 LAB — URINE CULTURE: Culture: 60000 — AB

## 2022-04-09 ENCOUNTER — Encounter: Payer: Self-pay | Admitting: Adult Health

## 2022-04-09 ENCOUNTER — Ambulatory Visit: Payer: Managed Care, Other (non HMO) | Admitting: Adult Health

## 2022-04-09 VITALS — BP 136/84 | HR 72 | Ht 60.0 in | Wt 121.0 lb

## 2022-04-09 DIAGNOSIS — Z3202 Encounter for pregnancy test, result negative: Secondary | ICD-10-CM | POA: Insufficient documentation

## 2022-04-09 DIAGNOSIS — N926 Irregular menstruation, unspecified: Secondary | ICD-10-CM | POA: Insufficient documentation

## 2022-04-09 DIAGNOSIS — Z7689 Persons encountering health services in other specified circumstances: Secondary | ICD-10-CM

## 2022-04-09 LAB — POCT URINE PREGNANCY: Preg Test, Ur: NEGATIVE

## 2022-04-09 MED ORDER — SLYND 4 MG PO TABS: 1.0000 | ORAL_TABLET | Freq: Every day | ORAL | 0 refills | Status: DC

## 2022-04-09 NOTE — Progress Notes (Signed)
  Subjective:     Patient ID: Madeline Davis, female   DOB: 1990-10-15, 31 y.o.   MRN: [id number]  HPI Madeline Davis is a 31 year old black female,single, G1P0010, in complaining of bleeding every 2 weeks, has some cramping too. She was interested in nexplanon for period control, but bleed when on depo, so may not be best choice.  Last pap was 11/16/21, negative malignancy and HPV.  Review of Systems Having period every 2 weeks with some cramping Has female partner Reviewed past medical,surgical, social and family history. Reviewed medications and allergies.     Objective:   Physical Exam BP 136/84 (BP Location: Right Arm, Patient Position: Sitting, Cuff Size: Normal)   Pulse 72   Ht 5' (1.524 m)   Wt 121 lb (54.9 kg)   LMP 04/05/2022 (Approximate)   BMI 23.63 kg/m  UPT is negative  Skin warm and dry. Lungs: clear to ausculation bilaterally. Cardiovascular: regular rate and rhythm.    Fall risk is low    04/09/2022    9:52 AM 11/16/2021    2:43 PM 11/04/2020    3:39 PM  Depression screen PHQ 2/9  Decreased Interest 0 1 0  Down, Depressed, Hopeless 0 1 0  PHQ - 2 Score 0 2 0  Altered sleeping  1 0  Tired, decreased energy  1 1  Change in appetite  1 0  Feeling bad or failure about yourself   0 0  Trouble concentrating  0 0  Moving slowly or fidgety/restless  0 0  Suicidal thoughts  0 0  PHQ-9 Score  5 1     Upstream - 04/09/22 0951       Pregnancy Intention Screening   Does the patient want to become pregnant in the next year? No    Does the patient's partner want to become pregnant in the next year? No    Would the patient like to discuss contraceptive options today? Yes   for period management     Contraception Wrap Up   Current Method No Method - Other Reason   pt states she is a lesbian   End Method No Method - Other Reason    Contraception Counseling Provided Yes             Assessment:      1. Negative pregnancy test  2. Irregular bleeding Has period every 2 weeks  with some cramping Will try slynd can start today  3. Encounter for menstrual regulation Discussed options and will try Slynd 3 sample packs given Meds ordered this encounter  Medications   Drospirenone (SLYND) 4 MG TABS    Sig: Take 1 tablet by mouth daily.    Dispense:  84 tablet    Refill:  0    Order Specific Question:   Supervising Provider    Answer:   Lazaro Arms [[id number]]       Plan:     Follow up in 10 weeks for ROS

## 2022-05-17 ENCOUNTER — Other Ambulatory Visit: Payer: Self-pay | Admitting: Adult Health

## 2022-05-17 MED ORDER — FLUCONAZOLE 150 MG PO TABS: ORAL_TABLET | ORAL | 1 refills | Status: DC

## 2022-05-17 NOTE — Progress Notes (Signed)
Rx diflucan.  

## 2022-06-21 ENCOUNTER — Ambulatory Visit: Payer: Managed Care, Other (non HMO) | Admitting: Adult Health

## 2022-06-23 ENCOUNTER — Other Ambulatory Visit (HOSPITAL_COMMUNITY): Payer: Self-pay

## 2022-06-23 ENCOUNTER — Other Ambulatory Visit: Payer: Self-pay | Admitting: Adult Health

## 2022-06-23 MED ORDER — NORETHIN ACE-ETH ESTRAD-FE 1-20 MG-MCG PO TABS
1.0000 | ORAL_TABLET | Freq: Every day | ORAL | 11 refills | Status: DC
  Filled 2022-06-23: qty 28, 28d supply, fill #0

## 2022-06-23 MED ORDER — NORETHIN ACE-ETH ESTRAD-FE 1-20 MG-MCG PO TABS: 1.0000 | ORAL_TABLET | Freq: Every day | ORAL | 11 refills | Status: DC

## 2022-06-23 NOTE — Progress Notes (Signed)
Rx sent to [facility] [address] for Junel 1.20

## 2022-06-23 NOTE — Progress Notes (Signed)
Bleeding with slynd, will try Junel 1/20

## 2022-07-26 ENCOUNTER — Ambulatory Visit: Payer: Managed Care, Other (non HMO) | Admitting: Adult Health

## 2022-10-06 ENCOUNTER — Telehealth: Payer: Self-pay | Admitting: Internal Medicine

## 2022-10-07 ENCOUNTER — Ambulatory Visit (INDEPENDENT_AMBULATORY_CARE_PROVIDER_SITE_OTHER): Payer: Managed Care, Other (non HMO) | Admitting: Family Medicine

## 2022-10-07 ENCOUNTER — Encounter: Payer: Self-pay | Admitting: Family Medicine

## 2022-10-07 VITALS — BP 130/82 | HR 85 | Ht 60.0 in | Wt 125.0 lb

## 2022-10-07 DIAGNOSIS — R7301 Impaired fasting glucose: Secondary | ICD-10-CM | POA: Diagnosis not present

## 2022-10-07 DIAGNOSIS — E559 Vitamin D deficiency, unspecified: Secondary | ICD-10-CM | POA: Diagnosis not present

## 2022-10-07 DIAGNOSIS — J4530 Mild persistent asthma, uncomplicated: Secondary | ICD-10-CM

## 2022-10-07 DIAGNOSIS — Z23 Encounter for immunization: Secondary | ICD-10-CM | POA: Diagnosis not present

## 2022-10-07 DIAGNOSIS — E0789 Other specified disorders of thyroid: Secondary | ICD-10-CM

## 2022-10-07 DIAGNOSIS — E7849 Other hyperlipidemia: Secondary | ICD-10-CM | POA: Diagnosis not present

## 2022-10-07 DIAGNOSIS — J019 Acute sinusitis, unspecified: Secondary | ICD-10-CM

## 2022-10-07 DIAGNOSIS — Z1159 Encounter for screening for other viral diseases: Secondary | ICD-10-CM

## 2022-10-07 DIAGNOSIS — B9789 Other viral agents as the cause of diseases classified elsewhere: Secondary | ICD-10-CM

## 2022-10-07 MED ORDER — ALBUTEROL SULFATE HFA 108 (90 BASE) MCG/ACT IN AERS: 1.0000 | INHALATION_SPRAY | Freq: Four times a day (QID) | RESPIRATORY_TRACT | 0 refills | Status: AC | PRN

## 2022-10-07 MED ORDER — FLUTICASONE PROPIONATE 50 MCG/ACT NA SUSP: 2.0000 | Freq: Every day | NASAL | 0 refills | Status: AC

## 2022-10-07 NOTE — Progress Notes (Signed)
New Patient Office Visit  Subjective:  Patient ID: Madeline Davis, female    DOB: 01-24-1991  Age: 32 y.o. MRN: [id number]  CC:  Chief Complaint  Patient presents with   New Patient (Initial Visit)    Establishing care previously seen by Dr. Wallace Cullens. Pt reports seasonal allergies that cause asthma flare up needs inhaler refills.     HPI Madeline Davis is a 32 y.o. female with past medical history of asthma, constipation, fatigue presents for establishing care. For the details of today's visit, please refer to the assessment and plan.     Past Medical History:  Diagnosis Date   Anemia    Phreesia 06/16/2020   Asthma    Asthma    Phreesia 06/16/2020   Constipation 08/12/2014   COVID    followed by cardiology now [facility]ord Buttner in [place])   Papanicolaou smear of cervix with positive high risk human papilloma virus (HPV) test 11/13/2020   11/13/20 repeat pap in 1 year, per [facility], 5 year risk for CIN 3+ is 4.8%   Tobacco use    Vitamin D deficiency 08/30/2016    Past Surgical History:  Procedure Laterality Date   LEFT HEART CATH AND CORONARY ANGIOGRAPHY N/A 09/17/2019   Procedure: LEFT HEART CATH AND CORONARY ANGIOGRAPHY;  Surgeon: Madeline Kendall, MD;  Location: [facility];  Service: Cardiovascular;  Laterality: N/A;    Family History  Problem Relation Age of Onset   Hypertension Mother    Heart disease Mother    Hypertension Father    Hyperlipidemia Father    Stroke Father    Heart attack Father 30   Alzheimer's disease Maternal Grandmother    Alzheimer's disease Maternal Grandfather     Social History   Socioeconomic History   Marital status: Single    Spouse name: Not on file   Number of children: Not on file   Years of education: Not on file   Highest education level: GED or equivalent  Occupational History   Occupation: [employer]    Comment: medical records  Tobacco Use   Smoking status: Some Days    Packs/day: 0.25    Years: 13.00    Additional pack years: 0.00     Total pack years: 3.25    Types: Cigarettes   Smokeless tobacco: Never  Vaping Use   Vaping Use: Never used  Substance and Sexual Activity   Alcohol use: Yes    Comment: occasionally-2-3 glasses of wine on weekends   Drug use: Never    Comment: not since september of 2012   Sexual activity: Yes    Birth control/protection: None    Comment: female partner  Other Topics Concern   Not on file  Social History Narrative   Not on file   Social Determinants of Health   Financial Resource Strain: Patient Declined (10/06/2022)   Overall Financial Resource Strain (CARDIA)    Difficulty of Paying Living Expenses: Patient declined  Food Insecurity: Unknown (10/06/2022)   Hunger Vital Sign    Worried About Running Out of Food in the Last Year: Patient declined    Ran Out of Food in the Last Year: Never true  Transportation Needs: No Transportation Needs (10/06/2022)   PRAPARE - Administrator, Civil Service (Medical): No    Lack of Transportation (Non-Medical): No  Physical Activity: Inactive (10/06/2022)   Exercise Vital Sign    Days of Exercise per Week: 0 days    Minutes of Exercise per Session:  0 min  Stress: No Stress Concern Present (10/06/2022)   [employer] of Occupational Health - Occupational Stress Questionnaire    Feeling of Stress : Only a little  Social Connections: Socially Integrated (10/06/2022)   Social Connection and Isolation Panel [NHANES]    Frequency of Communication with Friends and Family: More than three times a week    Frequency of Social Gatherings with Friends and Family: Once a week    Attends Religious Services: 1 to 4 times per year    Active Member of [employer] or Organizations: No    Attends Engineer, structural: More than 4 times per year    Marital Status: Living with partner  Intimate Partner Violence: Not At Risk (11/16/2021)   Humiliation, Afraid, Rape, and Kick questionnaire    Fear of Current or Ex-Partner: No    Emotionally  Abused: No    Physically Abused: No    Sexually Abused: No    ROS Review of Systems  Constitutional:  Negative for chills and fever.  Eyes:  Negative for visual disturbance.  Respiratory:  Negative for chest tightness and shortness of breath.   Neurological:  Negative for dizziness and headaches.    Objective:   Today's Vitals: BP 130/82 (BP Location: Left Arm)   Pulse 85   Ht 5' (1.524 m)   Wt 125 lb 0.6 oz (56.7 kg)   SpO2 96%   BMI 24.42 kg/m   Physical Exam HENT:     Head: Normocephalic.     Mouth/Throat:     Mouth: Mucous membranes are moist.  Cardiovascular:     Rate and Rhythm: Normal rate.     Heart sounds: Normal heart sounds.  Pulmonary:     Effort: Pulmonary effort is normal.     Breath sounds: Normal breath sounds.  Neurological:     Mental Status: She is alert.      Assessment & Plan:   Mild persistent asthma without complication Assessment & Plan: Well-controlled Use of either inhaler as needed No reported symptoms  Orders: -     Albuterol Sulfate HFA; Inhale 1-2 puffs into the lungs every 6 (six) hours as needed for wheezing or shortness of breath.  Dispense: 8 g; Refill: 0  Vitamin D deficiency -     VITAMIN D 25 Hydroxy (Vit-D Deficiency, Fractures)  Impaired fasting blood sugar  Other hyperlipidemia -     CBC with Differential/Platelet -     CMP14+EGFR -     Hemoglobin A1c -     Lipid panel  Other specified disorders of thyroid -     TSH + free T4  Encounter for hepatitis C screening test for low risk patient -     Hepatitis C antibody  Acute viral sinusitis -     Fluticasone Propionate; Place 2 sprays into both nostrils daily.  Dispense: 16 g; Refill: 0  Immunization due -     Tdap vaccine greater than or equal to 7yo IM     Follow-up: Return in about 3 months (around 01/07/2023).   Madeline Laroche, FNP

## 2022-10-07 NOTE — Patient Instructions (Signed)
I appreciate the opportunity to provide care to you today!    Follow up:  3 months  Labs: please stop by the lab during the week to get your blood drawn (CBC, CMP, TSH, Lipid profile, HgA1c, Vit D)    Please pick up your refills at the pharmacy   Please continue to a heart-healthy diet and increase your physical activities. Try to exercise for 30mins at least five days a week.      It was a pleasure to see you and I look forward to continuing to work together on your health and well-being. Please do not hesitate to call the office if you need care or have questions about your care.   Have a wonderful day and week. With Gratitude, Chandan Fly MSN, FNP-BC  

## 2022-10-07 NOTE — Telephone Encounter (Signed)
error 

## 2022-10-08 NOTE — Assessment & Plan Note (Signed)
Well-controlled Use of either inhaler as needed No reported symptoms

## 2022-12-30 ENCOUNTER — Ambulatory Visit: Payer: Managed Care, Other (non HMO) | Admitting: Adult Health

## 2023-01-07 ENCOUNTER — Ambulatory Visit: Payer: Managed Care, Other (non HMO) | Admitting: Family Medicine

## 2023-01-11 ENCOUNTER — Encounter: Payer: Self-pay | Admitting: Family Medicine

## 2023-03-21 ENCOUNTER — Telehealth: Payer: Managed Care, Other (non HMO) | Admitting: Physician Assistant

## 2023-03-21 DIAGNOSIS — B9689 Other specified bacterial agents as the cause of diseases classified elsewhere: Secondary | ICD-10-CM | POA: Diagnosis not present

## 2023-03-21 DIAGNOSIS — N760 Acute vaginitis: Secondary | ICD-10-CM | POA: Diagnosis not present

## 2023-03-21 MED ORDER — METRONIDAZOLE 500 MG PO TABS
500.0000 mg | ORAL_TABLET | Freq: Two times a day (BID) | ORAL | 0 refills | Status: AC
Start: 2023-03-21 — End: 2023-03-28

## 2023-03-21 NOTE — Progress Notes (Signed)
E-Visit for Vaginal Symptoms  We are sorry that you are not feeling well. Here is how we plan to help! Based on what you shared with me it looks like you: May have a vaginosis due to bacteria  Vaginosis is an inflammation of the vagina that can result in discharge, itching and pain. The cause is usually a change in the normal balance of vaginal bacteria or an infection. Vaginosis can also result from reduced estrogen levels after menopause.  The most common causes of vaginosis are:   Bacterial vaginosis which results from an overgrowth of one on several organisms that are normally present in your vagina.   Yeast infections which are caused by a naturally occurring fungus called candida.   Vaginal atrophy (atrophic vaginosis) which results from the thinning of the vagina from reduced estrogen levels after menopause.   Trichomoniasis which is caused by a parasite and is commonly transmitted by sexual intercourse.  Factors that increase your risk of developing vaginosis include: Medications, such as antibiotics and steroids Uncontrolled diabetes Use of hygiene products such as bubble bath, vaginal spray or vaginal deodorant Douching Wearing damp or tight-fitting clothing Using an intrauterine device (IUD) for birth control Hormonal changes, such as those associated with pregnancy, birth control pills or menopause Sexual activity Having a sexually transmitted infection  Your treatment plan is Metronidazole or Flagyl 500mg twice a day for 7 days.  I have electronically sent this prescription into the pharmacy that you have chosen.  Be sure to take all of the medication as directed. Stop taking any medication if you develop a rash, tongue swelling or shortness of breath. Mothers who are breast feeding should consider pumping and discarding their breast milk while on these antibiotics. However, there is no consensus that infant exposure at these doses would be harmful.  Remember that  medication creams can weaken latex condoms. .   HOME CARE:  Good hygiene may prevent some types of vaginosis from recurring and may relieve some symptoms:  Avoid baths, hot tubs and whirlpool spas. Rinse soap from your outer genital area after a shower, and dry the area well to prevent irritation. Don't use scented or harsh soaps, such as those with deodorant or antibacterial action. Avoid irritants. These include scented tampons and pads. Wipe from front to back after using the toilet. Doing so avoids spreading fecal bacteria to your vagina.  Other things that may help prevent vaginosis include:  Don't douche. Your vagina doesn't require cleansing other than normal bathing. Repetitive douching disrupts the normal organisms that reside in the vagina and can actually increase your risk of vaginal infection. Douching won't clear up a vaginal infection. Use a latex condom. Both female and female latex condoms may help you avoid infections spread by sexual contact. Wear cotton underwear. Also wear pantyhose with a cotton crotch. If you feel comfortable without it, skip wearing underwear to bed. Yeast thrives in moist environments Your symptoms should improve in the next day or two.  GET HELP RIGHT AWAY IF:  You have pain in your lower abdomen ( pelvic area or over your ovaries) You develop nausea or vomiting You develop a fever Your discharge changes or worsens You have persistent pain with intercourse You develop shortness of breath, a rapid pulse, or you faint.  These symptoms could be signs of problems or infections that need to be evaluated by a medical provider now.  MAKE SURE YOU   Understand these instructions. Will watch your condition. Will get help right   away if you are not doing well or get worse.  Thank you for choosing an e-visit.  Your e-visit answers were reviewed by a board certified advanced clinical practitioner to complete your personal care plan. Depending upon the  condition, your plan could have included both over the counter or prescription medications.  Please review your pharmacy choice. Make sure the pharmacy is open so you can pick up prescription now. If there is a problem, you may contact your provider through MyChart messaging and have the prescription routed to another pharmacy.  Your safety is important to us. If you have drug allergies check your prescription carefully.   For the next 24 hours you can use MyChart to ask questions about today's visit, request a non-urgent call back, or ask for a work or school excuse. You will get an email in the next two days asking about your experience. I hope that your e-visit has been valuable and will speed your recovery.  I have spent 5 minutes in review of e-visit questionnaire, review and updating patient chart, medical decision making and response to patient.   Siddalee Vanderheiden M Lujean Ebright, PA-C  

## 2023-05-18 ENCOUNTER — Ambulatory Visit
Admission: EM | Admit: 2023-05-18 | Discharge: 2023-05-18 | Disposition: A | Discharge status: Home / Self Care | Payer: Managed Care, Other (non HMO) | Attending: Family Medicine | Admitting: Family Medicine

## 2023-05-18 DIAGNOSIS — J069 Acute upper respiratory infection, unspecified: Secondary | ICD-10-CM

## 2023-05-18 LAB — POC COVID19/FLU A&B COMBO
Covid Antigen, POC: NEGATIVE
Influenza A Antigen, POC: NEGATIVE
Influenza B Antigen, POC: NEGATIVE

## 2023-05-18 MED ORDER — PROMETHAZINE-DM 6.25-15 MG/5ML PO SYRP: 5.0000 mL | ORAL_SOLUTION | Freq: Four times a day (QID) | ORAL | 0 refills | Status: DC | PRN

## 2023-05-18 MED ORDER — LIDOCAINE VISCOUS HCL 2 % MT SOLN: 10.0000 mL | OROMUCOSAL | 0 refills | Status: DC | PRN

## 2023-05-18 NOTE — ED Triage Notes (Signed)
Pt reports sinus drainage, headache, sore throat, chest congestion and cough x 1 day

## 2023-05-18 NOTE — ED Provider Notes (Signed)
[facility]    CSN: [id number] Arrival date & time: 05/18/23  1724      History   Chief Complaint No chief complaint on file.   HPI Madeline Davis is a 32 y.o. female.   Patient presenting today with 1 day history of sinus drainage, headache, sore throat, chest congestion, cough.  Denies fever, chest pain, shortness of breath, abdominal pain, nausea vomiting or diarrhea.  Sober not trying anything over-the-counter for symptoms.  History of asthma on albuterol as needed.    Past Medical History:  Diagnosis Date   Anemia    Phreesia 06/16/2020   Asthma    Asthma    Phreesia 06/16/2020   Constipation 08/12/2014   COVID    followed by cardiology now Bufford Buttner in [place])   Papanicolaou smear of cervix with positive high risk human papilloma virus (HPV) test 11/13/2020   11/13/20 repeat pap in 1 year, per [facility], 5 year risk for CIN 3+ is 4.8%   Tobacco use    Vitamin D deficiency 08/30/2016    Patient Active Problem List   Diagnosis Date Noted   Irregular bleeding 04/09/2022   Negative pregnancy test 04/09/2022   Encounter for menstrual regulation 04/09/2022   Irregular menstrual bleeding 11/16/2021   Encounter for gynecological examination with Papanicolaou smear of cervix 11/16/2021   Papanicolaou smear of cervix with positive high risk human papilloma virus (HPV) test 11/13/2020   Encounter to establish care 06/30/2020   Screening due 06/30/2020   ACS (acute coronary syndrome) (HCC) 09/15/2019   Viral syndrome 09/14/2019   Elevated troponin 09/14/2019   Hypokalemia 09/14/2019   COVID-19 virus infection 09/14/2019   Asthma 09/14/2019   Tobacco use 09/14/2019   Vitamin D deficiency 08/30/2016   Body aches 08/25/2016   Fatigue 08/25/2016   Constipation 08/12/2014    Past Surgical History:  Procedure Laterality Date   LEFT HEART CATH AND CORONARY ANGIOGRAPHY N/A 09/17/2019   Procedure: LEFT HEART CATH AND CORONARY ANGIOGRAPHY;  Surgeon: [name], MD;   Location: [facility];  Service: Cardiovascular;  Laterality: N/A;    OB History     Gravida  1   Para      Term      Preterm      AB  1   Living  0      SAB  1   IAB      Ectopic      Multiple      Live Births               Home Medications    Prior to Admission medications   Medication Sig Start Date End Date Taking? Authorizing Provider  lidocaine (XYLOCAINE) 2 % solution Use as directed 10 mLs in the mouth or throat every 3 (three) hours as needed. 05/18/23  Yes Particia Nearing, PA-C  promethazine-dextromethorphan (PROMETHAZINE-DM) 6.25-15 MG/5ML syrup Take 5 mLs by mouth 4 (four) times daily as needed. 05/18/23  Yes Particia Nearing, PA-C  albuterol (PROVENTIL) (2.5 MG/3ML) 0.083% nebulizer solution Take 3 mLs (2.5 mg total) by nebulization every 6 (six) hours as needed for wheezing or shortness of breath. 02/22/22   Valentino Nose, NP  albuterol (VENTOLIN HFA) 108 (90 Base) MCG/ACT inhaler Inhale 1-2 puffs into the lungs every 6 (six) hours as needed for wheezing or shortness of breath. 10/07/22   Gilmore Laroche, FNP  fluticasone (FLONASE) 50 MCG/ACT nasal spray Place 2 sprays into both nostrils daily. 10/07/22  Gilmore Laroche, FNP    Family History Family History  Problem Relation Age of Onset   Hypertension Mother    Heart disease Mother    Hypertension Father    Hyperlipidemia Father    Stroke Father    Heart attack Father 5   Alzheimer's disease Maternal Grandmother    Alzheimer's disease Maternal Grandfather     Social History Social History   Tobacco Use   Smoking status: Some Days    Current packs/day: 0.25    Average packs/day: 0.3 packs/day for 13.0 years (3.3 ttl pk-yrs)    Types: Cigarettes   Smokeless tobacco: Never  Vaping Use   Vaping status: Never Used  Substance Use Topics   Alcohol use: Yes    Comment: occasionally-2-3 glasses of wine on weekends   Drug use: Never    Comment: not since september  of 2012     Allergies   Patient has no known allergies.   Review of Systems Review of Systems Per HPI  Physical Exam Triage Vital Signs ED Triage Vitals  Encounter Vitals Group     BP 05/18/23 1728 (!) 147/84     Systolic BP Percentile --      Diastolic BP Percentile --      Pulse Rate 05/18/23 1728 74     Resp 05/18/23 1728 15     Temp 05/18/23 1728 99 F (37.2 C)     Temp src --      SpO2 05/18/23 1728 100 %     Weight --      Height --      Head Circumference --      Peak Flow --      Pain Score 05/18/23 1731 3     Pain Loc --      Pain Education --      Exclude from Growth Chart --    No data found.  Updated Vital Signs BP (!) 147/84 (BP Location: Right Arm)   Pulse 74   Temp 99 F (37.2 C)   Resp 15   LMP 05/05/2023 (Exact Date)   SpO2 100%   Visual Acuity Right Eye Distance:   Left Eye Distance:   Bilateral Distance:    Right Eye Near:   Left Eye Near:    Bilateral Near:     Physical Exam Vitals and nursing note reviewed.  Constitutional:      Appearance: Normal appearance.  HENT:     Head: Atraumatic.     Right Ear: Tympanic membrane and external ear normal.     Left Ear: Tympanic membrane and external ear normal.     Nose: Rhinorrhea present.     Mouth/Throat:     Mouth: Mucous membranes are moist.     Pharynx: Posterior oropharyngeal erythema present.  Eyes:     Extraocular Movements: Extraocular movements intact.     Conjunctiva/sclera: Conjunctivae normal.  Cardiovascular:     Rate and Rhythm: Normal rate and regular rhythm.     Heart sounds: Normal heart sounds.  Pulmonary:     Effort: Pulmonary effort is normal.     Breath sounds: Normal breath sounds. No wheezing or rales.  Musculoskeletal:        General: Normal range of motion.     Cervical back: Normal range of motion and neck supple.  Skin:    General: Skin is warm and dry.  Neurological:     Mental Status: She is alert and oriented to person, place, and time.  Psychiatric:        Mood and Affect: Mood normal.        Thought Content: Thought content normal.      UC Treatments / Results  Labs (all labs ordered are listed, but only abnormal results are displayed) Labs Reviewed  POC COVID19/FLU A&B COMBO    EKG   Radiology No results found.  Procedures Procedures (including critical care time)  Medications Ordered in UC Medications - No data to display  Initial Impression / Assessment and Plan / UC Course  I have reviewed the triage vital signs and the nursing notes.  Pertinent labs & imaging results that were available during my care of the patient were reviewed by me and considered in my medical decision making (see chart for details).     Vitals and exam very reassuring today, suspect viral respiratory infection.  Rapid COVID and flu negative.  Treat with viscous lidocaine, Phenergan DM, supportive over-the-counter medications and home care.  Return for worsening symptoms.  Final Clinical Impressions(s) / UC Diagnoses   Final diagnoses:  Viral URI with cough   Discharge Instructions   None    ED Prescriptions     Medication Sig Dispense Auth. Provider   lidocaine (XYLOCAINE) 2 % solution Use as directed 10 mLs in the mouth or throat every 3 (three) hours as needed. 100 mL Particia Nearing, PA-C   promethazine-dextromethorphan (PROMETHAZINE-DM) 6.25-15 MG/5ML syrup Take 5 mLs by mouth 4 (four) times daily as needed. 100 mL Particia Nearing, [place]      PDMP not reviewed this encounter.   Particia Nearing, [place] 05/18/23 [phone]

## 2023-05-28 ENCOUNTER — Telehealth: Payer: Managed Care, Other (non HMO) | Admitting: Family Medicine

## 2023-05-28 ENCOUNTER — Other Ambulatory Visit: Payer: Self-pay | Admitting: Nurse Practitioner

## 2023-05-28 DIAGNOSIS — J4530 Mild persistent asthma, uncomplicated: Secondary | ICD-10-CM | POA: Diagnosis not present

## 2023-05-28 MED ORDER — ALBUTEROL SULFATE (2.5 MG/3ML) 0.083% IN NEBU: 2.5000 mg | INHALATION_SOLUTION | Freq: Four times a day (QID) | RESPIRATORY_TRACT | 1 refills | Status: AC | PRN

## 2023-05-28 NOTE — Progress Notes (Signed)
E-Visit for Asthma  Based on what you have shared with me, it looks like you may have a flare up of your asthma.  Asthma is a chronic (ongoing) lung disease which results in airway obstruction, inflammation and hyper-responsiveness.   Asthma symptoms vary from person to person, with common symptoms including nighttime awakening and decreased ability to participate in normal activities as a result of shortness of breath. It is often triggered by changes in weather, changes in the season, changes in air temperature, or inside (home, school, daycare or work) allergens such as animal dander, mold, mildew, woodstoves or cockroaches.   It can also be triggered by hormonal changes, extreme emotion, physical exertion or an upper respiratory tract illness.     It is important to identify the trigger, and then eliminate or avoid the trigger if possible.   If you have been prescribed medications to be taken on a regular basis, it is important to follow the asthma action plan and to follow guidelines to adjust medication in response to increasing symptoms of decreased peak expiratory flow rate  Treatment: I have prescribed: Albuterol (Proventil) (2.5 mg in 3 mL) 0.083 % Take by nebulization solution every six hours as needed for wheezing or shortness of breath  HOME CARE Only take medications as instructed by your medical team. Consider wearing a mask or scarf to improve breathing air temperature have been shown to decrease irritation and decrease exacerbations Get rest. Taking a steamy shower or using a humidifier may help nasal congestion sand ease sore throat pain. You can place a towel over your head and breathe in the steam from hot water coming from a faucet. Using a saline nasal spray works much the same way.  Cough drops, hare candies and sore throat lozenges may ease your cough.   Avoid close contacts especially the very you and the elderly Cover your mouth if you cough or sneeze Always remember to wash your hands.    GET HELP RIGHT AWAY IF: You develop worsening symptoms; breathlessness at rest, drowsy, confused or agitated, unable to speak in full sentences You have coughing fits You develop a severe headache or visual changes You develop shortness of breath, difficulty breathing or start having chest pain Your symptoms persist after you have completed your treatment plan If your symptoms do not improve within 10 days  MAKE SURE YOU Understand these instructions. Will watch your condition. Will get help right away if you are not doing well or get worse.   Your e-visit answers were reviewed by a board certified advanced clinical practitioner to complete your personal care plan, Depending upon the condition, your plan could have included both over the counter or prescription medications.   Please review your pharmacy choice. Your safety is important to [place]. If you have drug allergies check your prescription carefully.  You can use MyChart to ask questions about today's visit, request a non-urgent  call back, or ask for a work or school excuse for 24 hours related to this e-Visit. If it has been greater than 24 hours you will need to follow up with your provider, or enter a new e-Visit to address those concerns.   You will get an e-mail in the next two days asking about your experience. I hope that your e-visit has been valuable and will speed your recovery. Thank you for using e-visits.  I have spent 5 minutes in review of e-visit questionnaire, review and updating patient chart, medical decision making and response to  patient.   Reed Pandy, PA-C

## 2023-06-14 ENCOUNTER — Other Ambulatory Visit: Payer: Self-pay | Admitting: Medical Genetics

## 2023-08-30 ENCOUNTER — Encounter: Payer: Self-pay | Admitting: Adult Health

## 2023-08-30 ENCOUNTER — Ambulatory Visit (INDEPENDENT_AMBULATORY_CARE_PROVIDER_SITE_OTHER): Payer: Managed Care, Other (non HMO) | Admitting: Adult Health

## 2023-08-30 ENCOUNTER — Other Ambulatory Visit (HOSPITAL_COMMUNITY)
Admission: RE | Admit: 2023-08-30 | Discharge: 2023-08-30 | Disposition: A | Discharge status: Home / Self Care | Source: Ambulatory Visit | Attending: Adult Health | Admitting: Adult Health

## 2023-08-30 VITALS — BP 130/88 | HR 82 | Ht 60.0 in | Wt 130.5 lb

## 2023-08-30 DIAGNOSIS — Z01419 Encounter for gynecological examination (general) (routine) without abnormal findings: Secondary | ICD-10-CM

## 2023-08-30 DIAGNOSIS — Z1331 Encounter for screening for depression: Secondary | ICD-10-CM | POA: Diagnosis not present

## 2023-08-30 DIAGNOSIS — K5900 Constipation, unspecified: Secondary | ICD-10-CM | POA: Diagnosis not present

## 2023-08-30 DIAGNOSIS — Z113 Encounter for screening for infections with a predominantly sexual mode of transmission: Secondary | ICD-10-CM

## 2023-08-30 NOTE — Progress Notes (Signed)
 Patient ID: Madeline Davis, female   DOB: [birth year], 33 y.o.   MRN: [id number] History of Present Illness: Madeline Davis is a 33 year old black female,single, G1P0010, in for a well woman gyn exam. She new partner.     Component Value Date/Time   DIAGPAP  11/16/2021 1459    - Negative for intraepithelial lesion or malignancy (NILM)   DIAGPAP  11/04/2020 1544    - Negative for intraepithelial lesion or malignancy (NILM)   DIAGPAP  08/25/2016 0000    NEGATIVE FOR INTRAEPITHELIAL LESIONS OR MALIGNANCY.   HPVHIGH Negative 11/16/2021 1459   HPVHIGH Positive (A) 11/04/2020 1544   ADEQPAP  11/16/2021 1459    Satisfactory for evaluation; transformation zone component PRESENT.   ADEQPAP  11/04/2020 1544    Satisfactory for evaluation; transformation zone component ABSENT.   ADEQPAP  08/25/2016 0000    Satisfactory for evaluation  endocervical/transformation zone component ABSENT.  PCP is Sharen Hones NP    Current Medications, Allergies, Past Medical History, Past Surgical History, Family History and Social History were reviewed in [employer] record.     Review of Systems: Patient denies any headaches, hearing loss, fatigue, blurred vision, shortness of breath, chest pain, abdominal pain, problems with  urination, or intercourse. No joint pain or mood swings.  Has had some constipation lately   Physical Exam:BP 130/88 (BP Location: Left Arm, Patient Position: Sitting, Cuff Size: Normal)   Pulse 82   Ht 5' (1.524 m)   Wt 130 lb 8 oz (59.2 kg)   LMP 08/16/2023 (Approximate)   BMI 25.49 kg/m   General:  Well developed, well nourished, no acute distress Skin:  Warm and dry Neck:  Midline trachea, normal thyroid, good ROM, no lymphadenopathy Lungs; Clear to auscultation bilaterally Breast:  No dominant palpable mass, retraction, or nipple discharge Cardiovascular: Regular rate and rhythm Abdomen:  Soft, non tender, no hepatosplenomegaly Pelvic:  External genitalia is normal in  appearance, no lesions.  The vagina is normal in appearance,white dicharge. Urethra has no lesions or masses. The cervix is smooth, CV swab obtained.  Uterus is felt to be normal size, shape, and contour.  No adnexal masses or tenderness noted.Bladder is non tender, no masses felt. Extremities/musculoskeletal:  No swelling or varicosities noted, no clubbing or cyanosis Psych:  No mood changes, alert and cooperative,seems happy AA is 5 Fall risk is low    08/30/2023    2:30 PM 04/09/2022    9:52 AM 11/16/2021    2:43 PM  Depression screen PHQ 2/9  Decreased Interest 0 0 1  Down, Depressed, Hopeless 0 0 1  PHQ - 2 Score 0 0 2  Altered sleeping 1  1  Tired, decreased energy 1  1  Change in appetite 0  1  Feeling bad or failure about yourself  0  0  Trouble concentrating 0  0  Moving slowly or fidgety/restless 0  0  Suicidal thoughts 0  0  PHQ-9 Score 2  5       08/30/2023    2:31 PM 11/16/2021    2:43 PM 11/04/2020    3:39 PM  GAD 7 : Generalized Anxiety Score  Nervous, Anxious, on Edge 0 1 0  Control/stop worrying 0 1 0  Worry too much - different things 0 1 0  Trouble relaxing 0 1 0  Restless 0 0 0  Easily annoyed or irritable 0 1 1  Afraid - awful might happen 0 0 0  Total GAD 7 Score  0 5 1    Upstream - 08/30/23 1429       Pregnancy Intention Screening   Does the patient want to become pregnant in the next year? No    Does the patient's partner want to become pregnant in the next year? No    Would the patient like to discuss contraceptive options today? No      Contraception Wrap Up   Current Method No Method - Other Reason    Reason for No Current Contraceptive Method at Intake ([facility] Only) Same Sex Partner    End Method No Method - Other Reason    Contraception Counseling Provided No            Examination chaperoned by Malachy Mood LPN    Impression and Plan: 1. Encounter for well woman exam with routine gynecological exam (Primary) Pap and physical in 1 year    2. Screening examination for STD (sexually transmitted disease) CV swab sent for GC/CHL,trich/BV and yeast  Check HIV and RPR - Cervicovaginal ancillary only( [place]) - HIV Antibody (routine testing w rflx) - RPR  3. Constipation, unspecified constipation type Try prune juice, apples with peeling, grapes, apple white grape juice and increase water

## 2023-08-31 LAB — CBC WITH DIFFERENTIAL/PLATELET
Basophils Absolute: 0.1 10*3/uL (ref 0.0–0.2)
Basos: 1 %
EOS (ABSOLUTE): 0.3 10*3/uL (ref 0.0–0.4)
Eos: 3 %
Hematocrit: 34.5 % (ref 34.0–46.6)
Hemoglobin: 11 g/dL — ABNORMAL LOW (ref 11.1–15.9)
Immature Grans (Abs): 0 10*3/uL (ref 0.0–0.1)
Immature Granulocytes: 0 %
Lymphocytes Absolute: 2.9 10*3/uL (ref 0.7–3.1)
Lymphs: 35 %
MCH: 27.4 pg (ref 26.6–33.0)
MCHC: 31.9 g/dL (ref 31.5–35.7)
MCV: 86 fL (ref 79–97)
Monocytes Absolute: 0.5 10*3/uL (ref 0.1–0.9)
Monocytes: 6 %
Neutrophils Absolute: 4.6 10*3/uL (ref 1.4–7.0)
Neutrophils: 55 %
Platelets: 422 10*3/uL (ref 150–450)
RBC: 4.02 x10E6/uL (ref 3.77–5.28)
RDW: 13.2 % (ref 11.7–15.4)
WBC: 8.4 10*3/uL (ref 3.4–10.8)

## 2023-08-31 LAB — CMP14+EGFR
ALT: 6 IU/L (ref 0–32)
AST: 12 IU/L (ref 0–40)
Albumin: 4.3 g/dL (ref 3.9–4.9)
Alkaline Phosphatase: 88 IU/L (ref 44–121)
BUN/Creatinine Ratio: 9 (ref 9–23)
BUN: 6 mg/dL (ref 6–20)
Bilirubin Total: <0.2 mg/dL (ref 0.0–1.2)
CO2: 20 mmol/L (ref 20–29)
Calcium: 9 mg/dL (ref 8.7–10.2)
Chloride: 105 mmol/L (ref 96–106)
Creatinine, Ser: 0.67 mg/dL (ref 0.57–1.00)
Globulin, Total: 2.6 g/dL (ref 1.5–4.5)
Glucose: 80 mg/dL (ref 70–99)
Potassium: 3.9 mmol/L (ref 3.5–5.2)
Sodium: 138 mmol/L (ref 134–144)
Total Protein: 6.9 g/dL (ref 6.0–8.5)
eGFR: 118 mL/min/{1.73_m2} (ref >59)

## 2023-08-31 LAB — LIPID PANEL
Chol/HDL Ratio: 3.8 ratio (ref 0.0–4.4)
Cholesterol, Total: 164 mg/dL (ref 100–199)
HDL: 43 mg/dL (ref >39)
LDL Chol Calc (NIH): 104 mg/dL — ABNORMAL HIGH (ref 0–99)
Triglycerides: 89 mg/dL (ref 0–149)
VLDL Cholesterol Cal: 17 mg/dL (ref 5–40)

## 2023-08-31 LAB — TSH+FREE T4
Free T4: 1.03 ng/dL (ref 0.82–1.77)
TSH: 1.16 u[IU]/mL (ref 0.450–4.500)

## 2023-08-31 LAB — VITAMIN D 25 HYDROXY (VIT D DEFICIENCY, FRACTURES): Vit D, 25-Hydroxy: 6.4 ng/mL — ABNORMAL LOW (ref 30.0–100.0)

## 2023-08-31 LAB — HEMOGLOBIN A1C
Est. average glucose Bld gHb Est-mCnc: 108 mg/dL
Hgb A1c MFr Bld: 5.4 % (ref 4.8–5.6)

## 2023-08-31 LAB — HIV ANTIBODY (ROUTINE TESTING W REFLEX): HIV Screen 4th Generation wRfx: NONREACTIVE

## 2023-08-31 LAB — RPR: RPR Ser Ql: NONREACTIVE

## 2023-08-31 LAB — HEPATITIS C ANTIBODY: Hep C Virus Ab: NONREACTIVE

## 2023-09-01 LAB — CERVICOVAGINAL ANCILLARY ONLY
Bacterial Vaginitis (gardnerella): POSITIVE — AB
Candida Glabrata: NEGATIVE
Candida Vaginitis: NEGATIVE
Chlamydia: NEGATIVE
Comment: NEGATIVE
Comment: NEGATIVE
Comment: NEGATIVE
Comment: NEGATIVE
Comment: NEGATIVE
Comment: NORMAL
Neisseria Gonorrhea: NEGATIVE
Trichomonas: NEGATIVE

## 2023-09-02 ENCOUNTER — Other Ambulatory Visit: Payer: Self-pay | Admitting: Adult Health

## 2023-09-02 MED ORDER — METRONIDAZOLE 500 MG PO TABS: 500.0000 mg | ORAL_TABLET | Freq: Two times a day (BID) | ORAL | 0 refills | Status: AC

## 2023-09-02 NOTE — Progress Notes (Signed)
+  BV on vaginal swab will rx flagyl, no sex or alcohol while taking meds.  

## 2023-09-04 ENCOUNTER — Encounter: Payer: Self-pay | Admitting: Family Medicine

## 2023-09-04 ENCOUNTER — Other Ambulatory Visit: Payer: Self-pay | Admitting: Family Medicine

## 2023-09-04 DIAGNOSIS — E559 Vitamin D deficiency, unspecified: Secondary | ICD-10-CM

## 2023-09-04 MED ORDER — VITAMIN D (ERGOCALCIFEROL) 1.25 MG (50000 UNIT) PO CAPS
50000.0000 [IU] | ORAL_CAPSULE | ORAL | 1 refills | Status: AC
  Filled 2023-09-04: qty 25, 175d supply, fill #0
  Filled 2023-09-05: qty 12, 84d supply, fill #0

## 2023-09-05 ENCOUNTER — Other Ambulatory Visit (HOSPITAL_COMMUNITY): Payer: Self-pay

## 2023-09-05 ENCOUNTER — Other Ambulatory Visit: Payer: Self-pay | Admitting: Family Medicine

## 2023-09-05 DIAGNOSIS — E559 Vitamin D deficiency, unspecified: Secondary | ICD-10-CM

## 2023-09-05 NOTE — Telephone Encounter (Signed)
 Copied from CRM [id number]. Topic: Clinical - Medication Refill >> Sep 05, 2023 10:35 AM Emylou G wrote: Most Recent Primary Care Visit:  Provider: Gilmore Laroche  Department: [facility]  Visit Type: OFFICE VISIT  Date: 10/07/2022  Medication: Vitamin D, Ergocalciferol, (DRISDOL) 1.25 MG (50000 UNIT) CAPS capsule  This was sent to wrong pharmacy the first time  Has the patient contacted their pharmacy? No (Agent: If no, request that the patient contact the pharmacy for the refill. If patient does not wish to contact the pharmacy document the reason why and proceed with request.) (Agent: If yes, when and what did the pharmacy advise?)  Is this the correct pharmacy for this prescription? Yes If no, delete pharmacy and type the correct one.  This is the patient's preferred pharmacy:    [id number] - [place], [place] - [address] [place] [address] Phone: [phone] Fax: [phone]   Has the prescription been filled recently? NO  Is the patient out of the medication? Yes - FIRST TIME  Has the patient been seen for an appointment in the last year OR does the patient have an upcoming appointment? Yes  Can we respond through MyChart? Yes  Agent: Please be advised that Rx refills may take up to 3 business days. We ask that you follow-up with your pharmacy.

## 2023-09-08 ENCOUNTER — Other Ambulatory Visit (HOSPITAL_COMMUNITY): Payer: Self-pay

## 2023-09-08 ENCOUNTER — Telehealth: Payer: Self-pay | Admitting: Family Medicine

## 2023-09-08 NOTE — Telephone Encounter (Signed)
 [facility] called and spoke to [place] optician about the Vitamin D Rx. Advised it was sent to [facility] on 09/04/23 and asked if it could be pulled to [facility]. She says she will call and get the Rx there. Patient called, left VM to return the call to the office. If patient returns the call, let her know the Rx will be filled at [facility] today and to reach out to them.  Copied from CRM [id number]. Topic: Clinical - Medication Refill >> Sep 05, 2023 10:35 AM Emylou G wrote: Most Recent Primary Care Visit:  Provider: Gilmore Laroche  Department: [facility]  Visit Type: OFFICE VISIT  Date: 10/07/2022  Medication: Vitamin D, Ergocalciferol, (DRISDOL) 1.25 MG (50000 UNIT) CAPS capsule  This was sent to wrong pharmacy the first time  Has the patient contacted their pharmacy? No (Agent: If no, request that the patient contact the pharmacy for the refill. If patient does not wish to contact the pharmacy document the reason why and proceed with request.) (Agent: If yes, when and what did the pharmacy advise?)  Is this the correct pharmacy for this prescription? Yes If no, delete pharmacy and type the correct one.  This is the patient's preferred pharmacy:    [id number] - [place], [place] - [address] [place] [address] Phone: [phone] Fax: [phone]   Has the prescription been filled recently? NO  Is the patient out of the medication? Yes - FIRST TIME  Has the patient been seen for an appointment in the last year OR does the patient have an upcoming appointment? Yes  Can we respond through MyChart? Yes  Agent: Please be advised that Rx refills may take up to 3 business days. We ask that you follow-up with your pharmacy. >> Sep 08, 2023 11:23 AM Eunice Blase wrote: Pt called stated pharmacy still does not have prescription Vitamin D, Ergocalciferol, (DRISDOL)  1.25 MG (50000 UNIT) CAPS capsule. Please call pt at  [phone].

## 2023-09-08 NOTE — Telephone Encounter (Signed)
 Relayed message to pt.

## 2023-11-24 ENCOUNTER — Ambulatory Visit
Admission: RE | Admit: 2023-11-24 | Discharge: 2023-11-24 | Disposition: A | Discharge status: Home / Self Care | Source: Ambulatory Visit | Attending: Nurse Practitioner | Admitting: Nurse Practitioner

## 2023-11-24 VITALS — BP 143/81 | HR 75 | Temp 99.0°F | Resp 18

## 2023-11-24 DIAGNOSIS — R350 Frequency of micturition: Secondary | ICD-10-CM | POA: Diagnosis present

## 2023-11-24 DIAGNOSIS — N898 Other specified noninflammatory disorders of vagina: Secondary | ICD-10-CM | POA: Insufficient documentation

## 2023-11-24 LAB — POCT URINALYSIS DIP (MANUAL ENTRY)
Bilirubin, UA: NEGATIVE
Glucose, UA: NEGATIVE mg/dL
Ketones, POC UA: NEGATIVE mg/dL
Nitrite, UA: NEGATIVE
Spec Grav, UA: >=1.03 — AB (ref 1.010–1.025)
Urobilinogen, UA: 1 U/dL
pH, UA: 6.5 (ref 5.0–8.0)

## 2023-11-24 NOTE — Discharge Instructions (Signed)
 A urine culture and cytology swab are pending.  You will be contacted if the pending test results are abnormal.  You also have access to your results via MyChart. You may take over-the-counter Tylenol  as needed for pain or discomfort. Try to increase your fluid intake.  Try to drink at least 8-10 8 ounce glasses of water daily while symptoms persist. Develop a toileting schedule that will allow you to urinate at least every 2 hours. Avoid caffeine such as tea, soda, or coffee while symptoms persist. If your cytology swab test results are positive, please notify all sexual partners.  If treatment is indicated, you will need to refrain from sexual intercourse for an additional 7 days after completing treatment. If symptoms appear to be worsening prior to your culture result being received, you may follow-up in this clinic or with your primary care physician for further evaluation. Follow-up as needed.

## 2023-11-24 NOTE — ED Provider Notes (Signed)
 [facility]    CSN: [id number] Arrival date & time: 11/24/23  1711      History   Chief Complaint Chief Complaint  Patient presents with   Urinary Frequency    Entered by patient    HPI Madeline Davis is a 33 y.o. female.   The history is provided by the patient.   Patient presents with a 2-day history of frequent urination and tingling with urination for the past 2 days.  She denies hesitancy, decreased urine stream, hematuria, flank pain, low back pain, vaginal odor, or vaginal itching.  She also endorses vaginal irritation.  Patient denies history of recurrent UTIs.  She reports 2 female partners in the past 90 days.  Denies history of kidney stones or pyelonephritis.  She has not taken any medication for her symptoms.  Patient reports that she has stopped drinking sodas, states that she has been drinking more alcohol due to family celebrations, but that she has attempted to increase her water intake.  Past Medical History:  Diagnosis Date   Anemia    Phreesia 06/16/2020   Asthma    Asthma    Phreesia 06/16/2020   Constipation 08/12/2014   COVID    followed by cardiology now [facility] in [place])   Papanicolaou smear of cervix with positive high risk human papilloma virus (HPV) test 11/13/2020   11/13/20 repeat pap in 1 year, per [facility], 5 year risk for CIN 3+ is 4.8%   Tobacco use    Vitamin D  deficiency 08/30/2016    Patient Active Problem List   Diagnosis Date Noted   Screening examination for STD (sexually transmitted disease) 08/30/2023   Encounter for well woman exam with routine gynecological exam 08/30/2023   Irregular bleeding 04/09/2022   Negative pregnancy test 04/09/2022   Encounter for menstrual regulation 04/09/2022   Irregular menstrual bleeding 11/16/2021   Encounter for gynecological examination with Papanicolaou smear of cervix 11/16/2021   Papanicolaou smear of cervix with positive high risk human papilloma virus (HPV) test 11/13/2020    Encounter to establish care 06/30/2020   Screening due 06/30/2020   ACS (acute coronary syndrome) (HCC) 09/15/2019   Viral syndrome 09/14/2019   Elevated troponin 09/14/2019   Hypokalemia 09/14/2019   COVID-19 virus infection 09/14/2019   Asthma 09/14/2019   Tobacco use 09/14/2019   Vitamin D  deficiency 08/30/2016   Body aches 08/25/2016   Fatigue 08/25/2016   Constipation 08/12/2014    Past Surgical History:  Procedure Laterality Date   LEFT HEART CATH AND CORONARY ANGIOGRAPHY N/A 09/17/2019   Procedure: LEFT HEART CATH AND CORONARY ANGIOGRAPHY;  Surgeon: Mady Bruckner, MD;  Location: [facility];  Service: Cardiovascular;  Laterality: N/A;    OB History     Gravida  1   Para      Term      Preterm      AB  1   Living  0      SAB  1   IAB      Ectopic      Multiple      Live Births               Home Medications    Prior to Admission medications   Medication Sig Start Date End Date Taking? Authorizing Provider  albuterol  (PROVENTIL ) (2.5 MG/3ML) 0.083% nebulizer solution Take 3 mLs (2.5 mg total) by nebulization every 6 (six) hours as needed for wheezing or shortness of breath. 05/28/23   Kingston Robes, PA-C  albuterol  (VENTOLIN  HFA) 108 (90 Base) MCG/ACT inhaler Inhale 1-2 puffs into the lungs every 6 (six) hours as needed for wheezing or shortness of breath. 10/07/22   Zarwolo, Gloria, FNP  fluticasone  (FLONASE ) 50 MCG/ACT nasal spray Place 2 sprays into both nostrils daily. 10/07/22   Zarwolo, Gloria, FNP  metroNIDAZOLE  (FLAGYL ) 500 MG tablet Take 1 tablet (500 mg total) by mouth 2 (two) times daily. 09/02/23   Signa Delon LABOR, NP  Vitamin D , Ergocalciferol , (DRISDOL ) 1.25 MG (50000 UNIT) CAPS capsule Take 1 capsule (50,000 Units total) by mouth every 7 (seven) days. 09/04/23   Zarwolo, Gloria, FNP    Family History Family History  Problem Relation Age of Onset   Hypertension Mother    Heart disease Mother    Hypertension Father     Hyperlipidemia Father    Stroke Father    Heart attack Father 75   Alzheimer's disease Maternal Grandmother    Alzheimer's disease Maternal Grandfather     Social History Social History   Tobacco Use   Smoking status: Some Days    Current packs/day: 0.25    Average packs/day: 0.3 packs/day for 13.0 years (3.3 ttl pk-yrs)    Types: Cigarettes   Smokeless tobacco: Never  Vaping Use   Vaping status: Never Used  Substance Use Topics   Alcohol use: Yes    Comment: occasionally-2-3 glasses of wine on weekends   Drug use: Never    Comment: not since september of 2012     Allergies   Patient has no known allergies.   Review of Systems Review of Systems Per HPI  Physical Exam Triage Vital Signs ED Triage Vitals  Encounter Vitals Group     BP 11/24/23 1723 (!) 143/81     Girls Systolic BP Percentile --      Girls Diastolic BP Percentile --      Boys Systolic BP Percentile --      Boys Diastolic BP Percentile --      Pulse Rate 11/24/23 1723 75     Resp 11/24/23 1723 18     Temp 11/24/23 1723 99 F (37.2 C)     Temp Source 11/24/23 1723 Oral     SpO2 11/24/23 1723 98 %     Weight --      Height --      Head Circumference --      Peak Flow --      Pain Score 11/24/23 1724 0     Pain Loc --      Pain Education --      Exclude from Growth Chart --    No data found.  Updated Vital Signs BP (!) 143/81 (BP Location: Right Arm)   Pulse 75   Temp 99 F (37.2 C) (Oral)   Resp 18   LMP 11/10/2023   SpO2 98%   Visual Acuity Right Eye Distance:   Left Eye Distance:   Bilateral Distance:    Right Eye Near:   Left Eye Near:    Bilateral Near:     Physical Exam Vitals and nursing note reviewed.  Constitutional:      General: She is not in acute distress.    Appearance: Normal appearance.  HENT:     Head: Normocephalic.   Eyes:     Extraocular Movements: Extraocular movements intact.     Conjunctiva/sclera: Conjunctivae normal.     Pupils: Pupils are  equal, round, and reactive to light.    Cardiovascular:  Rate and Rhythm: Normal rate and regular rhythm.     Pulses: Normal pulses.     Heart sounds: Normal heart sounds.  Pulmonary:     Effort: Pulmonary effort is normal. No respiratory distress.     Breath sounds: Normal breath sounds. No stridor. No wheezing, rhonchi or rales.  Abdominal:     Palpations: Abdomen is soft.     Tenderness: There is no abdominal tenderness. There is no right CVA tenderness or left CVA tenderness.   Musculoskeletal:     Cervical back: Normal range of motion.  Lymphadenopathy:     Cervical: No cervical adenopathy.   Skin:    General: Skin is warm and dry.   Neurological:     General: No focal deficit present.     Mental Status: She is alert and oriented to person, place, and time.   Psychiatric:        Mood and Affect: Mood normal.        Behavior: Behavior normal.      UC Treatments / Results  Labs (all labs ordered are listed, but only abnormal results are displayed) Labs Reviewed  POCT URINALYSIS DIP (MANUAL ENTRY) - Abnormal; Notable for the following components:      Result Value   Clarity, UA hazy (*)    Spec Grav, UA >=1.030 (*)    Blood, UA trace-intact (*)    Protein Ur, POC trace (*)    Leukocytes, UA Trace (*)    All other components within normal limits  URINE CULTURE  CERVICOVAGINAL ANCILLARY ONLY    EKG   Radiology No results found.  Procedures Procedures (including critical care time)  Medications Ordered in UC Medications - No data to display  Initial Impression / Assessment and Plan / UC Course  I have reviewed the triage vital signs and the nursing notes.  Pertinent labs & imaging results that were available during my care of the patient were reviewed by me and considered in my medical decision making (see chart for details).  Urinalysis does not indicate an obvious urinary tract infection.  Urine culture and cytology swab are pending.  In the  interim, supportive care recommendations were provided and discussed with the patient to include increasing her fluids, reiterating increasing her water intake, developing a toileting schedule, and avoiding caffeine.  Patient was also advised to notify all partners if her cytology swab results are positive along with refraining from sexual intercourse for 7 days after completing treatment if it is indicated.  Patient was given indications regarding follow-up.  Patient was in agreement with this plan of care and verbalizes understanding.  All questions were answered.  Patient stable for discharge.  Final Clinical Impressions(s) / UC Diagnoses   Final diagnoses:  Urinary frequency  Vaginal irritation     Discharge Instructions      A urine culture and cytology swab are pending.  You will be contacted if the pending test results are abnormal.  You also have access to your results via MyChart. You may take over-the-counter Tylenol  as needed for pain or discomfort. Try to increase your fluid intake.  Try to drink at least 8-10 8 ounce glasses of water daily while symptoms persist. Develop a toileting schedule that will allow you to urinate at least every 2 hours. Avoid caffeine such as tea, soda, or coffee while symptoms persist. If your cytology swab test results are positive, please notify all sexual partners.  If treatment is indicated, you will need to refrain  from sexual intercourse for an additional 7 days after completing treatment. If symptoms appear to be worsening prior to your culture result being received, you may follow-up in this clinic or with your primary care physician for further evaluation. Follow-up as needed.     ED Prescriptions   None    PDMP not reviewed this encounter.   Gilmer Etta PARAS, NP 11/24/23 1801

## 2023-11-24 NOTE — ED Triage Notes (Signed)
 Pt reports she has some frequent urination and forceful when urination x 2 days

## 2023-11-25 LAB — CERVICOVAGINAL ANCILLARY ONLY
Bacterial Vaginitis (gardnerella): NEGATIVE
Candida Glabrata: NEGATIVE
Candida Vaginitis: NEGATIVE
Chlamydia: NEGATIVE
Comment: NEGATIVE
Comment: NEGATIVE
Comment: NEGATIVE
Comment: NEGATIVE
Comment: NEGATIVE
Comment: NORMAL
Neisseria Gonorrhea: NEGATIVE
Trichomonas: NEGATIVE

## 2023-11-27 ENCOUNTER — Telehealth: Payer: Self-pay | Admitting: Family Medicine

## 2023-11-27 ENCOUNTER — Telehealth: Payer: Self-pay | Admitting: Emergency Medicine

## 2023-11-27 LAB — URINE CULTURE: Culture: >=100000 — AB

## 2023-11-27 MED ORDER — NITROFURANTOIN MONOHYD MACRO 100 MG PO CAPS
100.0000 mg | ORAL_CAPSULE | Freq: Two times a day (BID) | ORAL | 0 refills | Status: DC
  Filled 2023-11-27: qty 10, 5d supply, fill #0

## 2023-11-27 NOTE — Telephone Encounter (Signed)
 Macrobid  sent to treat E. coli UTI

## 2023-11-27 NOTE — Telephone Encounter (Signed)
 Pt called and inquired about urine culture results. Consulted PA and electronic prescription for Macrobid  sent in by provider. Pt aware.

## 2023-11-28 ENCOUNTER — Other Ambulatory Visit (HOSPITAL_COMMUNITY): Payer: Self-pay

## 2023-11-28 ENCOUNTER — Telehealth: Payer: Self-pay

## 2023-11-28 ENCOUNTER — Ambulatory Visit (HOSPITAL_COMMUNITY): Payer: Self-pay

## 2023-11-28 MED ORDER — NITROFURANTOIN MONOHYD MACRO 100 MG PO CAPS: 100.0000 mg | ORAL_CAPSULE | Freq: Two times a day (BID) | ORAL | 0 refills | Status: AC

## 2023-11-28 NOTE — Telephone Encounter (Signed)
 Pt called asking about her prescription for Macrobid . Looked in her chart and it was sent to [facility] in [place]. Called pt and she stated she wants [facility] on [address] in [place]. Resent prescription to there.

## 2023-12-15 ENCOUNTER — Other Ambulatory Visit: Payer: Self-pay | Admitting: Family Medicine

## 2023-12-15 ENCOUNTER — Telehealth: Payer: Self-pay

## 2023-12-15 DIAGNOSIS — J4530 Mild persistent asthma, uncomplicated: Secondary | ICD-10-CM

## 2023-12-15 NOTE — Telephone Encounter (Signed)
 Copied from CRM [id number]. Topic: Clinical - Prescription Issue >> Dec 15, 2023 11:51 AM DeAngela L wrote: Reason for CRM: patient calling after her albuterol  (VENTOLIN  HFA) 108 (90 Base) MCG/ACT inhaler Refill was declined at [facility]  Pt num  [phone] BENNIE)   [facility] [phone] - [place], [place] - [address] [place] [address] Phone: [phone] Fax: [phone]

## 2023-12-16 NOTE — Telephone Encounter (Signed)
 It was denied because she was last seen 09/2022 and was supposed to follow up in 12/2022. Needs appt

## 2024-03-21 ENCOUNTER — Other Ambulatory Visit: Payer: Self-pay | Admitting: Medical Genetics

## 2024-03-21 DIAGNOSIS — Z006 Encounter for examination for normal comparison and control in clinical research program: Secondary | ICD-10-CM

## 2024-04-30 ENCOUNTER — Ambulatory Visit: Payer: Self-pay

## 2024-05-01 ENCOUNTER — Inpatient Hospital Stay (HOSPITAL_COMMUNITY): Admission: RE | Admit: 2024-05-01 | Discharge: 2024-05-01 | Attending: Internal Medicine

## 2024-05-01 ENCOUNTER — Encounter (HOSPITAL_COMMUNITY): Payer: Self-pay

## 2024-05-01 VITALS — BP 125/81 | HR 75 | Temp 98.1°F | Resp 16

## 2024-05-01 DIAGNOSIS — M62830 Muscle spasm of back: Secondary | ICD-10-CM

## 2024-05-01 MED ORDER — BACLOFEN 10 MG PO TABS: 10.0000 mg | ORAL_TABLET | Freq: Three times a day (TID) | ORAL | 0 refills | Status: AC

## 2024-05-01 MED ORDER — NAPROXEN 500 MG PO TABS: 500.0000 mg | ORAL_TABLET | Freq: Two times a day (BID) | ORAL | 0 refills | Status: AC

## 2024-05-01 NOTE — ED Provider Notes (Signed)
 [facility]    CSN: [id number] Arrival date & time: 05/01/24  1635      History   Chief Complaint Chief Complaint  Patient presents with   Back Pain    Entered by patient    HPI Madeline Davis is a 33 y.o. female.   Madeline Davis is a 32 y.o. female presenting for chief complaint of left upper back pain that started 3 days ago on Saturday November 29th 2025 without any known trauma or injury to the upper back.  Pain is described as a constant, annoying, and dull ache that is currently a 4 on a scale of 0-10.  If she moves or twists/bends the upper back or coughs the pain comes a 7 on a scale of 0-10.  Rest/cessation of movement improves pain.  Pain is localized to the lateral left upper back/left upper rib cage.  Denies rashes to the overlying skin. No fall, trauma, numbness or tingling, saddle paresthesia, changes to bowel or urinary habits, extremity weakness, radicular symptoms.  Denies urinary symptoms, nausea, vomiting, and flank pain.  No fevers or chills.  Denies recent heavy lifting. She took ibuprofen  yesterday which relieved the pain temporarily. Pain returned today.    Back Pain   Past Medical History:  Diagnosis Date   Anemia    Phreesia 06/16/2020   Asthma    Asthma    Phreesia 06/16/2020   Constipation 08/12/2014   COVID    followed by cardiology now [facility] in [place])   Papanicolaou smear of cervix with positive high risk human papilloma virus (HPV) test 11/13/2020   11/13/20 repeat pap in 1 year, per [facility], 5 year risk for CIN 3+ is 4.8%   Tobacco use    Vitamin D  deficiency 08/30/2016    Patient Active Problem List   Diagnosis Date Noted   Screening examination for STD (sexually transmitted disease) 08/30/2023   Encounter for well woman exam with routine gynecological exam 08/30/2023   Irregular bleeding 04/09/2022   Negative pregnancy test 04/09/2022   Encounter for menstrual regulation 04/09/2022   Irregular menstrual bleeding 11/16/2021   Encounter  for gynecological examination with Papanicolaou smear of cervix 11/16/2021   Papanicolaou smear of cervix with positive high risk human papilloma virus (HPV) test 11/13/2020   Encounter to establish care 06/30/2020   Screening due 06/30/2020   ACS (acute coronary syndrome) (HCC) 09/15/2019   Viral syndrome 09/14/2019   Elevated troponin 09/14/2019   Hypokalemia 09/14/2019   COVID-19 virus infection 09/14/2019   Asthma 09/14/2019   Tobacco use 09/14/2019   Vitamin D  deficiency 08/30/2016   Body aches 08/25/2016   Fatigue 08/25/2016   Constipation 08/12/2014    Past Surgical History:  Procedure Laterality Date   LEFT HEART CATH AND CORONARY ANGIOGRAPHY N/A 09/17/2019   Procedure: LEFT HEART CATH AND CORONARY ANGIOGRAPHY;  Surgeon: Mady Bruckner, MD;  Location: [facility];  Service: Cardiovascular;  Laterality: N/A;    OB History     Gravida  1   Para      Term      Preterm      AB  1   Living  0      SAB  1   IAB      Ectopic      Multiple      Live Births               Home Medications    Prior to Admission medications   Medication Sig  Start Date End Date Taking? Authorizing Provider  baclofen (LIORESAL) 10 MG tablet Take 1 tablet (10 mg total) by mouth 3 (three) times daily. 05/01/24  Yes Enedelia Dorna HERO, FNP  naproxen  (NAPROSYN ) 500 MG tablet Take 1 tablet (500 mg total) by mouth 2 (two) times daily. 05/01/24  Yes Enedelia Dorna HERO, FNP  albuterol  (PROVENTIL ) (2.5 MG/3ML) 0.083% nebulizer solution Take 3 mLs (2.5 mg total) by nebulization every 6 (six) hours as needed for wheezing or shortness of breath. 05/28/23   Kingston Robes, PA-C  albuterol  (VENTOLIN  HFA) 108 (90 Base) MCG/ACT inhaler Inhale 1-2 puffs into the lungs every 6 (six) hours as needed for wheezing or shortness of breath. 10/07/22   Bacchus, Gloria Z, FNP  fluticasone  (FLONASE ) 50 MCG/ACT nasal spray Place 2 sprays into both nostrils daily. 10/07/22   Bacchus, Meade PEDLAR, FNP   metroNIDAZOLE  (FLAGYL ) 500 MG tablet Take 1 tablet (500 mg total) by mouth 2 (two) times daily. 09/02/23   Signa Delon LABOR, NP  nitrofurantoin , macrocrystal-monohydrate, (MACROBID ) 100 MG capsule Take 1 capsule (100 mg total) by mouth 2 (two) times daily. 11/28/23   Chandra Harlene LABOR, NP  Vitamin D , Ergocalciferol , (DRISDOL ) 1.25 MG (50000 UNIT) CAPS capsule Take 1 capsule (50,000 Units total) by mouth every 7 (seven) days. 09/04/23   Bacchus, Meade PEDLAR, FNP    Family History Family History  Problem Relation Age of Onset   Hypertension Mother    Heart disease Mother    Hypertension Father    Hyperlipidemia Father    Stroke Father    Heart attack Father 21   Alzheimer's disease Maternal Grandmother    Alzheimer's disease Maternal Grandfather     Social History Social History   Tobacco Use   Smoking status: Some Days    Current packs/day: 0.25    Average packs/day: 0.3 packs/day for 13.0 years (3.3 ttl pk-yrs)    Types: Cigarettes   Smokeless tobacco: Never  Vaping Use   Vaping status: Never Used  Substance Use Topics   Alcohol use: Yes    Comment: occasionally-2-3 glasses of wine on weekends   Drug use: Never    Comment: not since september of 2012     Allergies   Patient has no known allergies.   Review of Systems Review of Systems  Musculoskeletal:  Positive for back pain.  Per HPI   Physical Exam Triage Vital Signs ED Triage Vitals  Encounter Vitals Group     BP 05/01/24 1657 125/81     Girls Systolic BP Percentile --      Girls Diastolic BP Percentile --      Boys Systolic BP Percentile --      Boys Diastolic BP Percentile --      Pulse Rate 05/01/24 1657 75     Resp 05/01/24 1657 16     Temp 05/01/24 1657 98.1 F (36.7 C)     Temp Source 05/01/24 1657 Oral     SpO2 05/01/24 1657 98 %     Weight --      Height --      Head Circumference --      Peak Flow --      Pain Score 05/01/24 1656 5     Pain Loc --      Pain Education --      Exclude  from Growth Chart --    No data found.  Updated Vital Signs BP 125/81 (BP Location: Right Arm)   Pulse 75  Temp 98.1 F (36.7 C) (Oral)   Resp 16   LMP 04/26/2024 (Exact Date)   SpO2 98%   Visual Acuity Right Eye Distance:   Left Eye Distance:   Bilateral Distance:    Right Eye Near:   Left Eye Near:    Bilateral Near:     Physical Exam Vitals and nursing note reviewed.  Constitutional:      Appearance: She is not ill-appearing or toxic-appearing.  HENT:     Head: Normocephalic and atraumatic.     Right Ear: Hearing and external ear normal.     Left Ear: Hearing and external ear normal.     Nose: Nose normal.     Mouth/Throat:     Lips: Pink.  Eyes:     General: Lids are normal. Vision grossly intact. Gaze aligned appropriately.     Extraocular Movements: Extraocular movements intact.     Conjunctiva/sclera: Conjunctivae normal.  Pulmonary:     Effort: Pulmonary effort is normal.  Musculoskeletal:     Cervical back: Normal and neck supple.     Thoracic back: Tenderness present. No swelling, edema, deformity, signs of trauma, lacerations, spasms or bony tenderness. Normal range of motion. No scoliosis.     Lumbar back: Normal.       Back:     Comments: 5/5 strength against resistance to bilateralupper and lower extremities, sensation intact to distal bilateral upper and lower extremities.  Skin:    General: Skin is warm and dry.     Capillary Refill: Capillary refill takes less than 2 seconds.     Findings: No rash.  Neurological:     General: No focal deficit present.     Mental Status: She is alert and oriented to person, place, and time. Mental status is at baseline.     Cranial Nerves: No dysarthria or facial asymmetry.  Psychiatric:        Mood and Affect: Mood normal.        Speech: Speech normal.        Behavior: Behavior normal.        Thought Content: Thought content normal.        Judgment: Judgment normal.      UC Treatments / Results   Labs (all labs ordered are listed, but only abnormal results are displayed) Labs Reviewed - No data to display  EKG   Radiology No results found.  Procedures Procedures (including critical care time)  Medications Ordered in UC Medications - No data to display  Initial Impression / Assessment and Plan / UC Course  I have reviewed the triage vital signs and the nursing notes.  Pertinent labs & imaging results that were available during my care of the patient were reviewed by me and considered in my medical decision making (see chart for details).   1.  Spasm of thoracic back Evaluation suggests pain is musculoskeletal in nature. Will manage this with rest, gentle ROM exercises, heat therapy, naproxen  as needed for pain, and as needed use of muscle relaxer. Drowsiness precautions discussed regarding muscle relaxer use. Imaging: no indication for imaging based on stable musculoskeletal exam findings, atraumatic mechanism of injury.  May follow-up with PCP as needed.  Counseled patient on potential for adverse effects with medications prescribed/recommended today, strict ER and return-to-clinic precautions discussed, patient verbalized understanding.    Final Clinical Impressions(s) / UC Diagnoses   Final diagnoses:  Spasm of thoracic back muscle     Discharge Instructions  Your symptoms are likely due to a pulled muscle.  Pulled muscles typically heal on their own in several days.  Take naproxen  every 12 hours for the next 2 to 3 days with food consistently, then take this medication every 12 hours as needed for pain and inflammation of the upper back.  Take muscle relaxer as needed for muscle spasm, mostly take this at bedtime as this medicine can cause drowsiness.  Apply heat to the pulled muscle 20 minutes on 20 minutes off as needed, heat relaxes muscles.  Perform gentle exercises and stretches to area of tenderness.  I do not recommend any massage at this time  as this could worsen pain as the muscle spasm/strain heals.  I would like for you to rest, however I do not want you to avoid moving the area. Movement and stretching will help with healing.  If you develop any new or worsening symptoms or if your symptoms do not start to improve, please return here or follow-up with your primary care provider. If your symptoms are severe, please go to the emergency room.     ED Prescriptions     Medication Sig Dispense Auth. Provider   naproxen  (NAPROSYN ) 500 MG tablet Take 1 tablet (500 mg total) by mouth 2 (two) times daily. 30 tablet Sophee Mckimmy M, FNP   baclofen (LIORESAL) 10 MG tablet Take 1 tablet (10 mg total) by mouth 3 (three) times daily. 30 each Enedelia Dorna HERO, FNP      PDMP not reviewed this encounter.   Enedelia Dorna HERO, [place] 05/01/24 1757

## 2024-05-01 NOTE — Discharge Instructions (Signed)
 Your symptoms are likely due to a pulled muscle.  Pulled muscles typically heal on their own in several days.  Take naproxen  every 12 hours for the next 2 to 3 days with food consistently, then take this medication every 12 hours as needed for pain and inflammation of the upper back.  Take muscle relaxer as needed for muscle spasm, mostly take this at bedtime as this medicine can cause drowsiness.  Apply heat to the pulled muscle 20 minutes on 20 minutes off as needed, heat relaxes muscles.  Perform gentle exercises and stretches to area of tenderness.  I do not recommend any massage at this time as this could worsen pain as the muscle spasm/strain heals.  I would like for you to rest, however I do not want you to avoid moving the area. Movement and stretching will help with healing.  If you develop any new or worsening symptoms or if your symptoms do not start to improve, please return here or follow-up with your primary care provider. If your symptoms are severe, please go to the emergency room.

## 2024-05-01 NOTE — ED Triage Notes (Signed)
 Pt reports left mid back pain. States she woke up on Saturday with the back pain. States she felt like she may have pulled a muscle, took Ibuprofen  with som relief and woke up this morning with the pain again.

## 2024-06-03 ENCOUNTER — Emergency Department (HOSPITAL_COMMUNITY)
Admission: EM | Admit: 2024-06-03 | Discharge: 2024-06-03 | Disposition: A | Discharge status: Home / Self Care | Attending: Emergency Medicine | Admitting: Emergency Medicine

## 2024-06-03 ENCOUNTER — Emergency Department (HOSPITAL_COMMUNITY)

## 2024-06-03 ENCOUNTER — Encounter (HOSPITAL_COMMUNITY): Payer: Self-pay

## 2024-06-03 ENCOUNTER — Other Ambulatory Visit: Payer: Self-pay

## 2024-06-03 DIAGNOSIS — J45909 Unspecified asthma, uncomplicated: Secondary | ICD-10-CM | POA: Diagnosis not present

## 2024-06-03 DIAGNOSIS — Z7951 Long term (current) use of inhaled steroids: Secondary | ICD-10-CM | POA: Insufficient documentation

## 2024-06-03 DIAGNOSIS — S92351A Displaced fracture of fifth metatarsal bone, right foot, initial encounter for closed fracture: Secondary | ICD-10-CM | POA: Insufficient documentation

## 2024-06-03 DIAGNOSIS — Z720 Tobacco use: Secondary | ICD-10-CM | POA: Insufficient documentation

## 2024-06-03 DIAGNOSIS — X501XXA Overexertion from prolonged static or awkward postures, initial encounter: Secondary | ICD-10-CM | POA: Diagnosis not present

## 2024-06-03 DIAGNOSIS — M79671 Pain in right foot: Secondary | ICD-10-CM | POA: Diagnosis present

## 2024-06-03 DIAGNOSIS — S92354A Nondisplaced fracture of fifth metatarsal bone, right foot, initial encounter for closed fracture: Secondary | ICD-10-CM

## 2024-06-03 MED ORDER — IBUPROFEN 400 MG PO TABS
600.0000 mg | ORAL_TABLET | Freq: Once | ORAL | Status: AC
  Administered 2024-06-03: 600 mg via ORAL
  Filled 2024-06-03: qty 2

## 2024-06-03 NOTE — ED Provider Notes (Signed)
 " [facility] Provider Note  CSN: [id number] Arrival date & time: 06/03/24 1912  Chief Complaint(s) Foot Pain  HPI Madeline Davis is a 34 y.o. female without significant past medical history presenting to the emergency department with right foot pain.  Began hurting this morning.  She twisted it yesterday but it was not hurting then.  Pain is on the outside part of the foot.  No ankle pain or other injury.  No wound.   Past Medical History Past Medical History:  Diagnosis Date   Anemia    Phreesia 06/16/2020   Asthma    Asthma    Phreesia 06/16/2020   Constipation 08/12/2014   COVID    followed by cardiology now Zandra in [place])   Papanicolaou smear of cervix with positive high risk human papilloma virus (HPV) test 11/13/2020   11/13/20 repeat pap in 1 year, per [facility], 5 year risk for CIN 3+ is 4.8%   Tobacco use    Vitamin D  deficiency 08/30/2016   Patient Active Problem List   Diagnosis Date Noted   Screening examination for STD (sexually transmitted disease) 08/30/2023   Encounter for well woman exam with routine gynecological exam 08/30/2023   Irregular bleeding 04/09/2022   Negative pregnancy test 04/09/2022   Encounter for menstrual regulation 04/09/2022   Irregular menstrual bleeding 11/16/2021   Encounter for gynecological examination with Papanicolaou smear of cervix 11/16/2021   Papanicolaou smear of cervix with positive high risk human papilloma virus (HPV) test 11/13/2020   Encounter to establish care 06/30/2020   Screening due 06/30/2020   ACS (acute coronary syndrome) (HCC) 09/15/2019   Viral syndrome 09/14/2019   Elevated troponin 09/14/2019   Hypokalemia 09/14/2019   COVID-19 virus infection 09/14/2019   Asthma 09/14/2019   Tobacco use 09/14/2019   Vitamin D  deficiency 08/30/2016   Body aches 08/25/2016   Fatigue 08/25/2016   Constipation 08/12/2014   Home Medication(s) Prior to Admission medications  Medication  Sig Start Date End Date Taking? Authorizing Provider  albuterol  (PROVENTIL ) (2.5 MG/3ML) 0.083% nebulizer solution Take 3 mLs (2.5 mg total) by nebulization every 6 (six) hours as needed for wheezing or shortness of breath. 05/28/23   Kingston Robes, PA-C  albuterol  (VENTOLIN  HFA) 108 (90 Base) MCG/ACT inhaler Inhale 1-2 puffs into the lungs every 6 (six) hours as needed for wheezing or shortness of breath. 10/07/22   Bacchus, Meade PEDLAR, FNP  baclofen  (LIORESAL ) 10 MG tablet Take 1 tablet (10 mg total) by mouth 3 (three) times daily. 05/01/24   Enedelia Dorna HERO, FNP  fluticasone  (FLONASE ) 50 MCG/ACT nasal spray Place 2 sprays into both nostrils daily. 10/07/22   Bacchus, Meade PEDLAR, FNP  metroNIDAZOLE  (FLAGYL ) 500 MG tablet Take 1 tablet (500 mg total) by mouth 2 (two) times daily. 09/02/23   Signa Delon LABOR, NP  naproxen  (NAPROSYN ) 500 MG tablet Take 1 tablet (500 mg total) by mouth 2 (two) times daily. 05/01/24   Enedelia Dorna HERO, FNP  nitrofurantoin , macrocrystal-monohydrate, (MACROBID ) 100 MG capsule Take 1 capsule (100 mg total) by mouth 2 (two) times daily. 11/28/23   Chandra Harlene LABOR, NP  Vitamin D , Ergocalciferol , (DRISDOL ) 1.25 MG (50000 UNIT) CAPS capsule Take 1 capsule (50,000 Units total) by mouth every 7 (seven) days. 09/04/23   Bacchus, Meade PEDLAR, FNP  Past Surgical History Past Surgical History:  Procedure Laterality Date   LEFT HEART CATH AND CORONARY ANGIOGRAPHY N/A 09/17/2019   Procedure: LEFT HEART CATH AND CORONARY ANGIOGRAPHY;  Surgeon: Mady Bruckner, MD;  Location: [facility]ASIVE [facility];  Service: Cardiovascular;  Laterality: N/A;   Family History Family History  Problem Relation Age of Onset   Hypertension Mother    Heart disease Mother    Hypertension Father    Hyperlipidemia Father    Stroke Father    Heart attack Father 40   Alzheimer's  disease Maternal Grandmother    Alzheimer's disease Maternal Grandfather     Social History Social History[1] Allergies Patient has no known allergies.  Review of Systems Review of Systems  All other systems reviewed and are negative.   Physical Exam Vital Signs  I have reviewed the triage vital signs BP 127/88   Pulse 87   Temp 99.3 F (37.4 C) (Oral)   Resp 19   Ht 5' (1.524 m)   Wt 61.2 kg   LMP 05/24/2024 (Approximate)   SpO2 99%   BMI 26.37 kg/m  Physical Exam Vitals and nursing note reviewed.  Constitutional:      Appearance: Normal appearance.  HENT:     Head: Normocephalic and atraumatic.     Mouth/Throat:     Mouth: Mucous membranes are moist.  Eyes:     Conjunctiva/sclera: Conjunctivae normal.  Cardiovascular:     Rate and Rhythm: Normal rate.  Pulmonary:     Effort: Pulmonary effort is normal. No respiratory distress.  Abdominal:     General: Abdomen is flat.  Musculoskeletal:        General: No deformity.     Comments: Mild soft tissue swelling of the right foot.  Tenderness along the lateral aspect of the foot.  No tenderness along the sole or midfoot.  No wounds.  2+ DP pulse.  Able to wiggle toes.  No ankle tenderness  Skin:    General: Skin is warm and dry.     Capillary Refill: Capillary refill takes less than 2 seconds.  Neurological:     General: No focal deficit present.     Mental Status: She is alert. Mental status is at baseline.  Psychiatric:        Mood and Affect: Mood normal.        Behavior: Behavior normal.     ED Results and Treatments Labs (all labs ordered are listed, but only abnormal results are displayed) Labs Reviewed - No data to display                                                                                                                        Radiology DG Foot Complete Right Result Date: 06/03/2024 CLINICAL DATA:  Pain and swelling EXAM: RIGHT FOOT COMPLETE - 3+ VIEW COMPARISON:  None Available.  FINDINGS: Acute mildly displaced fracture involving the mid to distal shaft of the fifth metatarsal. No subluxation. Positive for soft tissue  swelling IMPRESSION: Acute mildly displaced fifth metatarsal shaft fracture. Electronically Signed   By: Luke Bun M.D.   On: 06/03/2024 19:58    Pertinent labs & imaging results that were available during my care of the patient were reviewed by me and considered in my medical decision making (see MDM for details).  Medications Ordered in ED Medications  ibuprofen  (ADVIL ) tablet 600 mg (600 mg Oral Given 06/03/24 2127)                                                                                                                                     Procedures Procedures  (including critical care time)  Medical Decision Making / ED Course   MDM:  34 year old presenting to the emergency department with foot pain.  Has foot pain to the lateral aspect of foot and x-ray does show a minimally displaced fifth metatarsal fracture.  Does not appear to be Jones or pseudo Jones fracture but rather shaft fracture.  Will put patient in a postop shoe and provided crutches.  Recommended follow-up with orthopedic surgery.  No evidence of any other injuries.  Will discharge patient to home. All questions answered. Patient comfortable with plan of discharge. Return precautions discussed with patient and specified on the after visit summary.      Imaging Studies ordered: I ordered imaging studies including XR  On my interpretation imaging demonstrates 5th metatarsal fx  I independently visualized and interpreted imaging. I agree with the radiologist interpretation   Medicines ordered and prescription drug management: Meds ordered this encounter  Medications   ibuprofen  (ADVIL ) tablet 600 mg    -I have reviewed the patients home medicines and have made adjustments as needed  Reevaluation: After the interventions noted above, I reevaluated the patient  and found that their symptoms have improved  Co morbidities that complicate the patient evaluation  Past Medical History:  Diagnosis Date   Anemia    Phreesia 06/16/2020   Asthma    Asthma    Phreesia 06/16/2020   Constipation 08/12/2014   COVID    followed by cardiology now Zandra in [place])   Papanicolaou smear of cervix with positive high risk human papilloma virus (HPV) test 11/13/2020   11/13/20 repeat pap in 1 year, per [facility], 5 year risk for CIN 3+ is 4.8%   Tobacco use    Vitamin D  deficiency 08/30/2016      Dispostion: Disposition decision including need for hospitalization was considered, and patient discharged from emergency department.    Final Clinical Impression(s) / ED Diagnoses Final diagnoses:  Closed nondisplaced fracture of fifth metatarsal bone of right foot, initial encounter     This chart was dictated using voice recognition software.  Despite best efforts to proofread,  errors can occur which can change the documentation meaning.     [1]  Social History Tobacco Use   Smoking status: Some Days    Current packs/day:  0.25    Average packs/day: 0.3 packs/day for 13.0 years (3.3 ttl pk-yrs)    Types: Cigarettes   Smokeless tobacco: Never  Vaping Use   Vaping status: Never Used  Substance Use Topics   Alcohol use: Yes    Comment: occasionally-2-3 glasses of wine on weekends   Drug use: Never    Comment: not since september of 2012     Francesca Elsie CROME, MD 06/03/24 2131  "

## 2024-06-03 NOTE — ED Triage Notes (Signed)
 Pt to ED from home with c/o right foot pain and swelling that started when she got up this morning. Pt denies injury. Mild swelling noted.

## 2024-06-03 NOTE — Discharge Instructions (Signed)
 We evaluated you for your foot pain.  Your x-ray shows a fracture of your metatarsal, bone of your foot.  We have given you a special shoe and crutches.  If you can tolerate walking on your foot it is okay to put weight on your foot.  Please take Tylenol  (acetaminophen ) and Motrin  (ibuprofen ) for your symptoms at home.  You can take 1000 mg of Tylenol  every 6 hours and 600 mg of Motrin  every 6 hours as needed for your symptoms.  You can take these medicines together as needed, either at the same time, or alternating every 3 hours.  Follow-up with the orthopedic specialist.  You can follow-up with Dr. Margrette in [place] or Dr. Celena in [place].  Please elevate your leg and apply ice for pain and swelling.

## 2024-06-13 ENCOUNTER — Ambulatory Visit: Admitting: Orthopedic Surgery
